# Patient Record
Sex: Female | Born: 1960
Health system: Southern US, Community
[De-identification: ages and names within clinical notes are randomized; demographics above are authoritative.]

## PROBLEM LIST (undated history)

## (undated) DIAGNOSIS — M545 Low back pain: Secondary | ICD-10-CM

## (undated) DIAGNOSIS — Z86010 Personal history of colonic polyps: Secondary | ICD-10-CM

## (undated) DIAGNOSIS — T7840XA Allergy, unspecified, initial encounter: Secondary | ICD-10-CM

## (undated) DIAGNOSIS — E1165 Type 2 diabetes mellitus with hyperglycemia: Secondary | ICD-10-CM

## (undated) DIAGNOSIS — F411 Generalized anxiety disorder: Secondary | ICD-10-CM

## (undated) DIAGNOSIS — R002 Palpitations: Secondary | ICD-10-CM

## (undated) DIAGNOSIS — Z0000 Encounter for general adult medical examination without abnormal findings: Secondary | ICD-10-CM

## (undated) DIAGNOSIS — E669 Obesity, unspecified: Secondary | ICD-10-CM

## (undated) DIAGNOSIS — Z720 Tobacco use: Secondary | ICD-10-CM

## (undated) DIAGNOSIS — F17200 Nicotine dependence, unspecified, uncomplicated: Secondary | ICD-10-CM

## (undated) DIAGNOSIS — E663 Overweight: Secondary | ICD-10-CM

## (undated) DIAGNOSIS — R319 Hematuria, unspecified: Secondary | ICD-10-CM

## (undated) DIAGNOSIS — R7309 Other abnormal glucose: Secondary | ICD-10-CM

## (undated) DIAGNOSIS — F329 Major depressive disorder, single episode, unspecified: Secondary | ICD-10-CM

## (undated) DIAGNOSIS — E785 Hyperlipidemia, unspecified: Secondary | ICD-10-CM

## (undated) HISTORY — DX: Hyperlipidemia, unspecified: E78.5

## (undated) HISTORY — DX: Major depressive disorder, single episode, unspecified: F32.9

## (undated) HISTORY — DX: Nicotine dependence, unspecified, uncomplicated: F17.200

## (undated) HISTORY — DX: Low back pain: M54.5

## (undated) HISTORY — DX: Obesity, unspecified: E66.9

## (undated) HISTORY — DX: Encounter for general adult medical examination without abnormal findings: Z00.00

## (undated) HISTORY — DX: Overweight: E66.3

## (undated) HISTORY — DX: Other abnormal glucose: R73.09

## (undated) HISTORY — DX: Hematuria, unspecified: R31.9

## (undated) HISTORY — DX: Generalized anxiety disorder: F41.1

## (undated) HISTORY — DX: Palpitations: R00.2

## (undated) HISTORY — DX: Personal history of colonic polyps: Z86.010

## (undated) HISTORY — DX: Type 2 diabetes mellitus with hyperglycemia: E11.65

## (undated) HISTORY — DX: Allergy, unspecified, initial encounter: T78.40XA

## (undated) HISTORY — DX: Tobacco use: Z72.0

---

## 2001-01-11 ENCOUNTER — Other Ambulatory Visit: Admission: RE | Admit: 2001-01-11 | Discharge: 2001-01-11 | Payer: Self-pay | Admitting: Obstetrics and Gynecology

## 2001-01-12 ENCOUNTER — Other Ambulatory Visit: Admission: RE | Admit: 2001-01-12 | Discharge: 2001-01-12 | Payer: Self-pay | Admitting: Obstetrics and Gynecology

## 2002-07-04 ENCOUNTER — Other Ambulatory Visit: Admission: RE | Admit: 2002-07-04 | Discharge: 2002-07-04 | Payer: Self-pay | Admitting: Obstetrics and Gynecology

## 2002-07-30 ENCOUNTER — Ambulatory Visit (HOSPITAL_COMMUNITY): Admission: RE | Admit: 2002-07-30 | Discharge: 2002-07-30 | Payer: Self-pay | Admitting: Obstetrics and Gynecology

## 2004-08-23 HISTORY — PX: COLONOSCOPY: SHX174

## 2004-09-03 ENCOUNTER — Ambulatory Visit: Payer: Self-pay | Admitting: Family Medicine

## 2005-04-08 ENCOUNTER — Ambulatory Visit (HOSPITAL_COMMUNITY): Admission: RE | Admit: 2005-04-08 | Discharge: 2005-04-08 | Payer: Self-pay | Admitting: General Surgery

## 2005-04-08 ENCOUNTER — Encounter (INDEPENDENT_AMBULATORY_CARE_PROVIDER_SITE_OTHER): Payer: Self-pay | Admitting: Specialist

## 2005-04-08 ENCOUNTER — Ambulatory Visit (HOSPITAL_BASED_OUTPATIENT_CLINIC_OR_DEPARTMENT_OTHER): Admission: RE | Admit: 2005-04-08 | Discharge: 2005-04-08 | Payer: Self-pay | Admitting: General Surgery

## 2005-06-29 ENCOUNTER — Ambulatory Visit: Payer: Self-pay | Admitting: Family Medicine

## 2005-12-08 ENCOUNTER — Ambulatory Visit: Payer: Self-pay | Admitting: Family Medicine

## 2007-05-02 DIAGNOSIS — Z860100 Personal history of colon polyps, unspecified: Secondary | ICD-10-CM

## 2007-05-02 HISTORY — DX: Personal history of colonic polyps: Z86.010

## 2007-05-02 HISTORY — DX: Personal history of colon polyps, unspecified: Z86.0100

## 2007-05-10 ENCOUNTER — Telehealth: Payer: Self-pay | Admitting: Family Medicine

## 2007-07-12 ENCOUNTER — Ambulatory Visit: Payer: Self-pay | Admitting: Family Medicine

## 2007-07-12 DIAGNOSIS — R7309 Other abnormal glucose: Secondary | ICD-10-CM | POA: Insufficient documentation

## 2007-07-12 DIAGNOSIS — F418 Other specified anxiety disorders: Secondary | ICD-10-CM | POA: Insufficient documentation

## 2007-07-12 DIAGNOSIS — F3289 Other specified depressive episodes: Secondary | ICD-10-CM

## 2007-07-12 DIAGNOSIS — F329 Major depressive disorder, single episode, unspecified: Secondary | ICD-10-CM

## 2007-07-12 DIAGNOSIS — F411 Generalized anxiety disorder: Secondary | ICD-10-CM

## 2007-07-12 HISTORY — DX: Other abnormal glucose: R73.09

## 2007-07-12 HISTORY — DX: Generalized anxiety disorder: F41.1

## 2007-07-12 HISTORY — DX: Other specified depressive episodes: F32.89

## 2007-07-12 HISTORY — DX: Major depressive disorder, single episode, unspecified: F32.9

## 2007-07-13 LAB — CONVERTED CEMR LAB
ALT: 17 units/L (ref 0–35)
Alkaline Phosphatase: 54 units/L (ref 39–117)
BUN: 12 mg/dL (ref 6–23)
Basophils Relative: 0.5 % (ref 0.0–1.0)
CO2: 27 meq/L (ref 19–32)
Calcium: 9.8 mg/dL (ref 8.4–10.5)
Cholesterol: 210 mg/dL (ref 0–200)
Eosinophils Absolute: 0 10*3/uL (ref 0.0–0.6)
GFR calc Af Amer: 99 mL/min
GFR calc non Af Amer: 82 mL/min
Glucose, Urine, Semiquant: NEGATIVE
HDL: 51.3 mg/dL (ref >39.0)
Ketones, urine, test strip: NEGATIVE
Lymphocytes Relative: 41.6 % (ref 12.0–46.0)
Monocytes Relative: 5.7 % (ref 3.0–11.0)
Neutro Abs: 4.1 10*3/uL (ref 1.4–7.7)
Platelets: 321 10*3/uL (ref 150–400)
RBC: 4.46 M/uL (ref 3.87–5.11)
TSH: 1.59 microintl units/mL (ref 0.35–5.50)
Triglycerides: 117 mg/dL (ref 0–149)
VLDL: 23 mg/dL (ref 0–40)

## 2007-07-27 ENCOUNTER — Ambulatory Visit: Payer: Self-pay | Admitting: Family Medicine

## 2007-10-31 ENCOUNTER — Ambulatory Visit: Payer: Self-pay | Admitting: Family Medicine

## 2007-11-07 ENCOUNTER — Ambulatory Visit: Payer: Self-pay | Admitting: Family Medicine

## 2007-11-07 DIAGNOSIS — E669 Obesity, unspecified: Secondary | ICD-10-CM | POA: Insufficient documentation

## 2007-11-07 HISTORY — DX: Obesity, unspecified: E66.9

## 2007-11-07 LAB — CONVERTED CEMR LAB: Glucose, Bld: 137 mg/dL — ABNORMAL HIGH (ref 70–99)

## 2008-02-07 ENCOUNTER — Ambulatory Visit: Payer: Self-pay | Admitting: Family Medicine

## 2008-02-07 DIAGNOSIS — E785 Hyperlipidemia, unspecified: Secondary | ICD-10-CM

## 2008-02-07 DIAGNOSIS — E782 Mixed hyperlipidemia: Secondary | ICD-10-CM | POA: Insufficient documentation

## 2008-02-07 HISTORY — DX: Hyperlipidemia, unspecified: E78.5

## 2008-02-14 ENCOUNTER — Ambulatory Visit: Payer: Self-pay | Admitting: Family Medicine

## 2008-02-14 LAB — CONVERTED CEMR LAB
Cholesterol: 202 mg/dL (ref 0–200)
Total CHOL/HDL Ratio: 4.3

## 2008-05-06 ENCOUNTER — Ambulatory Visit: Payer: Self-pay | Admitting: Family Medicine

## 2008-05-08 LAB — CONVERTED CEMR LAB: Hgb A1c MFr Bld: 7.6 % — ABNORMAL HIGH (ref 4.6–6.0)

## 2008-05-14 ENCOUNTER — Ambulatory Visit: Payer: Self-pay | Admitting: Family Medicine

## 2008-08-07 ENCOUNTER — Ambulatory Visit: Payer: Self-pay | Admitting: Family Medicine

## 2008-08-07 LAB — CONVERTED CEMR LAB
Glucose, Urine, Semiquant: NEGATIVE
Specific Gravity, Urine: >=1.03
WBC Urine, dipstick: NEGATIVE
pH: 5

## 2008-09-03 ENCOUNTER — Ambulatory Visit: Payer: Self-pay | Admitting: Family Medicine

## 2008-09-03 LAB — CONVERTED CEMR LAB
Albumin: 3.7 g/dL (ref 3.5–5.2)
BUN: 12 mg/dL (ref 6–23)
Basophils Relative: 0.3 % (ref 0.0–3.0)
Calcium: 9 mg/dL (ref 8.4–10.5)
Creatinine, Ser: 0.8 mg/dL (ref 0.4–1.2)
Creatinine,U: 289.7 mg/dL
Eosinophils Absolute: 0.1 10*3/uL (ref 0.0–0.7)
Eosinophils Relative: 0.8 % (ref 0.0–5.0)
GFR calc Af Amer: 99 mL/min
GFR calc non Af Amer: 82 mL/min
HCT: 38.1 % (ref 36.0–46.0)
HDL: 51.7 mg/dL (ref >39.0)
Hemoglobin: 13 g/dL (ref 12.0–15.0)
Hgb A1c MFr Bld: 7.1 % — ABNORMAL HIGH (ref 4.6–6.0)
MCV: 87.7 fL (ref 78.0–100.0)
Microalb Creat Ratio: 4.1 mg/g (ref 0.0–30.0)
Microalb, Ur: 1.2 mg/dL (ref 0.0–1.9)
Monocytes Absolute: 0.5 10*3/uL (ref 0.1–1.0)
Neutro Abs: 4.6 10*3/uL (ref 1.4–7.7)
Platelets: 293 10*3/uL (ref 150–400)
Potassium: 4 meq/L (ref 3.5–5.1)
Total Protein: 7.1 g/dL (ref 6.0–8.3)
WBC: 7.7 10*3/uL (ref 4.5–10.5)

## 2008-12-03 ENCOUNTER — Ambulatory Visit: Payer: Self-pay | Admitting: Family Medicine

## 2008-12-10 ENCOUNTER — Ambulatory Visit: Payer: Self-pay | Admitting: Family Medicine

## 2009-04-14 ENCOUNTER — Ambulatory Visit: Payer: Self-pay | Admitting: Family Medicine

## 2009-04-22 ENCOUNTER — Ambulatory Visit: Payer: Self-pay | Admitting: Family Medicine

## 2009-09-30 ENCOUNTER — Ambulatory Visit: Payer: Self-pay | Admitting: Family Medicine

## 2009-10-03 LAB — CONVERTED CEMR LAB: Hgb A1c MFr Bld: 7 % — ABNORMAL HIGH (ref 4.6–6.5)

## 2009-10-07 ENCOUNTER — Ambulatory Visit: Payer: Self-pay | Admitting: Family Medicine

## 2010-01-27 ENCOUNTER — Ambulatory Visit: Payer: Self-pay | Admitting: Family Medicine

## 2010-02-17 ENCOUNTER — Ambulatory Visit: Payer: Self-pay | Admitting: Family Medicine

## 2010-02-25 ENCOUNTER — Ambulatory Visit: Payer: Self-pay | Admitting: Family Medicine

## 2010-02-25 DIAGNOSIS — E669 Obesity, unspecified: Secondary | ICD-10-CM

## 2010-02-25 DIAGNOSIS — IMO0001 Reserved for inherently not codable concepts without codable children: Secondary | ICD-10-CM

## 2010-02-25 DIAGNOSIS — E1169 Type 2 diabetes mellitus with other specified complication: Secondary | ICD-10-CM | POA: Insufficient documentation

## 2010-02-25 HISTORY — DX: Reserved for inherently not codable concepts without codable children: IMO0001

## 2010-02-25 LAB — CONVERTED CEMR LAB
Albumin: 3.7 g/dL (ref 3.5–5.2)
Alkaline Phosphatase: 47 units/L (ref 39–117)
Basophils Absolute: 0 10*3/uL (ref 0.0–0.1)
Basophils Relative: 0.5 % (ref 0.0–3.0)
Bilirubin, Direct: 0.1 mg/dL (ref 0.0–0.3)
CO2: 27 meq/L (ref 19–32)
Calcium: 8.9 mg/dL (ref 8.4–10.5)
Chloride: 109 meq/L (ref 96–112)
Cholesterol: 213 mg/dL — ABNORMAL HIGH (ref 0–200)
Creatinine, Ser: 0.8 mg/dL (ref 0.4–1.2)
Creatinine,U: 185.8 mg/dL
Eosinophils Absolute: 0.1 10*3/uL (ref 0.0–0.7)
Glucose, Bld: 134 mg/dL — ABNORMAL HIGH (ref 70–99)
Hgb A1c MFr Bld: 7.3 % — ABNORMAL HIGH (ref 4.6–6.5)
Lymphocytes Relative: 34.7 % (ref 12.0–46.0)
MCHC: 33.8 g/dL (ref 30.0–36.0)
MCV: 84.9 fL (ref 78.0–100.0)
Microalb, Ur: 1.4 mg/dL (ref 0.0–1.9)
Monocytes Absolute: 0.6 10*3/uL (ref 0.1–1.0)
Neutro Abs: 3.8 10*3/uL (ref 1.4–7.7)
Neutrophils Relative %: 55.5 % (ref 43.0–77.0)
RDW: 16.3 % — ABNORMAL HIGH (ref 11.5–14.6)
Total Protein: 7.1 g/dL (ref 6.0–8.3)
Triglycerides: 154 mg/dL — ABNORMAL HIGH (ref 0.0–149.0)

## 2010-03-02 LAB — CONVERTED CEMR LAB
Bilirubin Urine: NEGATIVE
Glucose, Urine, Semiquant: NEGATIVE
Ketones, urine, test strip: NEGATIVE
Specific Gravity, Urine: >=1.03
pH: 5

## 2010-06-24 ENCOUNTER — Ambulatory Visit: Payer: Self-pay | Admitting: Family Medicine

## 2010-06-24 DIAGNOSIS — E663 Overweight: Secondary | ICD-10-CM

## 2010-06-24 DIAGNOSIS — F17200 Nicotine dependence, unspecified, uncomplicated: Secondary | ICD-10-CM | POA: Insufficient documentation

## 2010-06-24 HISTORY — DX: Nicotine dependence, unspecified, uncomplicated: F17.200

## 2010-06-24 HISTORY — DX: Overweight: E66.3

## 2010-09-12 ENCOUNTER — Encounter: Payer: Self-pay | Admitting: Family Medicine

## 2010-09-15 ENCOUNTER — Encounter: Payer: Self-pay | Admitting: Family Medicine

## 2010-09-16 LAB — CONVERTED CEMR LAB
ALT: 13 units/L (ref 0–35)
AST: 18 units/L (ref 0–37)
Alkaline Phosphatase: 45 units/L (ref 39–117)
Basophils Absolute: 0 10*3/uL (ref 0.0–0.1)
Basophils Relative: 0.2 % (ref 0.0–3.0)
CO2: 28 meq/L (ref 19–32)
Chloride: 107 meq/L (ref 96–112)
Cholesterol: 211 mg/dL — ABNORMAL HIGH (ref 0–200)
Eosinophils Relative: 0.2 % (ref 0.0–5.0)
GFR calc non Af Amer: 90.02 mL/min (ref >60)
HCT: 35.9 % — ABNORMAL LOW (ref 36.0–46.0)
Hemoglobin: 12.1 g/dL (ref 12.0–15.0)
Hgb A1c MFr Bld: 6.9 % — ABNORMAL HIGH (ref 4.6–6.5)
Lymphocytes Relative: 32.4 % (ref 12.0–46.0)
Lymphs Abs: 2.5 10*3/uL (ref 0.7–4.0)
Monocytes Relative: 6.1 % (ref 3.0–12.0)
Neutro Abs: 4.6 10*3/uL (ref 1.4–7.7)
Phosphorus: 3.6 mg/dL (ref 2.3–4.6)
Potassium: 4.3 meq/L (ref 3.5–5.1)
RBC: 4.23 M/uL (ref 3.87–5.11)
RDW: 15.8 % — ABNORMAL HIGH (ref 11.5–14.6)
TSH: 1.22 microintl units/mL (ref 0.35–5.50)
Total CHOL/HDL Ratio: 4
VLDL: 37.2 mg/dL (ref 0.0–40.0)
WBC: 7.6 10*3/uL (ref 4.5–10.5)

## 2010-09-22 NOTE — Assessment & Plan Note (Addendum)
Summary: fu on dm/njr   Vital Signs:  Patient profile:   50 year old female Height:      62 inches (157.48 cm) Weight:      171 pounds (77.73 kg) O2 Sat:      98 % on Room air Temp:     98.4 degrees F (36.89 degrees C) oral Pulse rate:   89 / minute BP sitting:   122 / 82  (left arm) Cuff size:   regular  Vitals Entered By: Josph Macho RMA (June 24, 2010 9:45 AM)  O2 Flow:  Room air CC: Follow up on Diabetes/ needs labs for A1C/ CF   History of Present Illness: smoking female in today for reevaluation of hyperglycemia. She is a diagnosed diabetic but says she does not consider herself diabetic, does not check her sugars at home. Denies polyuria polydipsia. Has been exercising eating less and avoiding simple carbs since her last visit and is pleased with her weight loss. She denies any recent illness, fevers, chills, chest pain, palpitations, GI or GU complaints. She does continue to smoke a pack and a half per day and declines smoking cessation efforts. She is under a great deal less dressed in a bad marriage and doesn't leave the Zoloft and alprazolam helped with the degree that she is unwilling to quit smoking at this time  Preventive Screening-Counseling & Management  Alcohol-Tobacco     Smoking Cessation Counseling: YES  Current Medications (verified): 1)  Zoloft 100 Mg  Tabs (Sertraline Hcl) .... Once Daily 2)  Alprazolam 0.5 Mg  Tabs (Alprazolam) .... Three Times A Day As Needed Stress No Early Refills. Must Fill On Sch Only  Allergies (verified): 1)  ! Sulfa 2)  ! * Levauquin 3)  ! Sulfa  Past History:  Past medical history reviewed for relevance to current acute and chronic problems. Social history (including risk factors) reviewed for relevance to current acute and chronic problems.  Past Medical History: Reviewed history from 01/27/2010 and no changes required. Colonic polyps, hx of UTI PAP  sees [facility]  DEXA-BONE DENSITY DT  refused  today     SMOKER   ppd Anxiety  Social History: Reviewed history from 01/27/2010 and no changes required. Occupation: Single Current Smoker - 1 pack daily Alcohol use-no Drug use-no Regular exercise-no  Review of Systems      See HPI  Physical Exam  General:  Well-developed,well-nourished,in no acute distress; alert,appropriate and cooperative throughout examination Head:  Normocephalic and atraumatic without obvious abnormalities. No apparent alopecia or balding. Mouth:  Oral mucosa and oropharynx without lesions or exudates.   Lungs:  Normal respiratory effort, chest expands symmetrically. Lungs are clear to auscultation, no crackles or wheezes. Heart:  Normal rate and regular rhythm. S1 and S2 normal without gallop, murmur, click, rub or other extra sounds. Abdomen:  Bowel sounds positive,abdomen soft and non-tender without masses, organomegaly or hernias noted. Extremities:  No clubbing, cyanosis, edema, or deformity noted  Psych:  Cognition and judgment appear intact. Alert and cooperative with normal attention span and concentration. No apparent delusions, illusions, hallucinations   Impression & Recommendations:  Problem # 1:  DIABETES MELLITUS, TYPE II, UNCONTROLLED (ICD-250.02)  Orders: TLB-Renal Function Panel (80069-RENAL) TLB-A1C / Hgb A1C (Glycohemoglobin) (83036-A1C) Specimen Handling (16109) Venipuncture (60454) Avoid all simple carbs and set up repeat appt in 3 months  Problem # 2:  TOBACCO USER (ICD-305.1)  Orders: Tobacco use cessation intermediate 3-10 minutes (09811) Encouraged cessation at length, patient declined  Problem # 3:  OVERWEIGHT (ICD-278.02)  Orders: TLB-CBC Platelet - w/Differential (85025-CBCD) TLB-Hepatic/Liver Function Pnl (80076-HEPATIC) TLB-TSH (Thyroid Stimulating Hormone) (84443-TSH) Cont exercise regimen and dietary changes, avoid all trans fats  Problem # 4:  HYPERLIPIDEMIA (ICD-272.4)  Orders: TLB-Lipid Panel  (80061-LIPID) Patient declines medications and even fish oil supplmentation  Complete Medication List: 1)  Zoloft 100 Mg Tabs (Sertraline hcl) .... Once daily 2)  Alprazolam 0.5 Mg Tabs (Alprazolam) .... Three times a day as needed stress no early refills. must fill on sch only  Patient Instructions: 1)  Please schedule a follow-up appointment in 3 months .  2)  BMP prior to visit, ICD-9: 250.02 3)  Hepatic Panel prior to visit ICD-9: 278.02 4)  Lipid panel prior to visit ICD-9 : 272.4 5)  TSH prior to visit ICD-9 : 278.02 6)  CBC w/ Diff prior to visit ICD-9 : 278.02 7)  HgBA1c prior to visit  ICD-9: 250.02 8)  Urine Microalbumin prior to visit ICD-9 : 250.02 9)  Tobacco is very bad for your health and your loved ones ! You should stop smoking !  10)  Stop smoking tips: Choose a quit date. Cut down before the quit date. Decide what you will do as a substitute when you feel the urge to smoke(gum, toothpick, exercise).  11)  You need to lose weight. Consider a lower calorie diet and regular exercise.    Orders Added: 1)  Tobacco use cessation intermediate 3-10 minutes [99406] 2)  TLB-Lipid Panel [80061-LIPID] 3)  TLB-Renal Function Panel [80069-RENAL] 4)  TLB-CBC Platelet - w/Differential [85025-CBCD] 5)  TLB-Hepatic/Liver Function Pnl [80076-HEPATIC] 6)  TLB-TSH (Thyroid Stimulating Hormone) [84443-TSH] 7)  TLB-A1C / Hgb A1C (Glycohemoglobin) [83036-A1C] 8)  Specimen Handling [99000] 9)  Venipuncture [36415] 10)  Est. Patient Level IV [08657]

## 2010-09-22 NOTE — Assessment & Plan Note (Signed)
Summary: fu on meds/njr   Vital Signs:  Patient profile:   50 year old female Weight:      176.4 pounds BMI:     32.38 O2 Sat:      98 % Temp:     98 degrees F Pulse rate:   86 / minute Pulse rhythm:   regular BP sitting:   130 / 84  (left arm)  Vitals Entered By: Pura Spice, RN (October 07, 2009 11:12 AM) CC: results labs and refill alprazolam    History of Present Illness: This 50 year old white married female known diabetic2 is in for her 3 month followup of diabetes with a hemoglobin A1c also in for refill of medications for anxiety depression Diabetes controlled by diet and weight loss which he has lost approximately 5 pounds Patient relates that she has been doing for well, feels great and continues to try lose weight pleased with the hemoglobin A1c of 7.2, was 7.0 last visit No new complaint and will schedule complete on the next exam  Allergies: 1)  ! Sulfa 2)  ! * Levauquin  Past History:  Past Medical History: Last updated: 09/03/2008 Colonic polyps, hx of UTI     PAP  sees [facility]     DEXA-BONE DENSITY  DT  refused today     SMOKER   ppd  Past Surgical History: Last updated: 05/02/2007 Laparoscopy  Social History: Last updated: 05/02/2007 Occupation: Single Current Smoker Alcohol use-no Drug use-no Regular exercise-no  Risk Factors: Smoking Status: current (05/02/2007) Packs/Day: 1.5 (02/14/2008)  Review of Systems  The patient denies anorexia, fever, weight loss, weight gain, vision loss, decreased hearing, hoarseness, chest pain, syncope, dyspnea on exertion, peripheral edema, prolonged cough, headaches, hemoptysis, abdominal pain, melena, hematochezia, severe indigestion/heartburn, hematuria, incontinence, genital sores, muscle weakness, suspicious skin lesions, transient blindness, difficulty walking, depression, unusual weight change, abnormal bleeding, enlarged lymph nodes, angioedema, breast masses, and testicular  masses.    Physical Exam  General:  Well-developed,well-nourished,in no acute distress; alert,appropriate and cooperative throughout examinationoverweight-appearing.   Neck:  No deformities, masses, or tenderness noted. Chest Wall:  No deformities, masses, or tenderness noted. Breasts:  not examined Lungs:  Normal respiratory effort, chest expands symmetrically. Lungs are clear to auscultation, no crackles or wheezes. Heart:  Normal rate and regular rhythm. S1 and S2 normal without gallop, murmur, click, rub or other extra sounds. Abdomen:  Bowel sounds positive,abdomen soft and non-tender without masses, organomegaly or hernias noted. Rectal:  not examined Genitalia:  not examined Msk:  No deformity or scoliosis noted of thoracic or lumbar spine.   Skin:  Intact without suspicious lesions or rashes Psych:  Cognition and judgment appear intact. Alert and cooperative with normal attention span and concentration. No apparent delusions, illusions, hallucinations   Impression & Recommendations:  Problem # 1:  DIABETES MELLITUS, TYPE II, MILD (ICD-250.00) Assessment Improved  Her updated medication list for this problem includes:    Aspir-low 81 Mg Tbec (Aspirin) .Marland Kitchen... 1 qd  Problem # 2:  HYPERLIPIDEMIA (ICD-272.4) Assessment: Improved  Problem # 3:  HYPERLIPIDEMIA (ICD-272.4) Assessment: Improved  Problem # 4:  EXOGENOUS OBESITY (ICD-278.00) Assessment: Improved  Problem # 5:  DEPRESSION (ICD-311) Assessment: Improved  Her updated medication list for this problem includes:    Zoloft 100 Mg Tabs (Sertraline hcl) ..... Once daily    Alprazolam 0.5 Mg Tabs (Alprazolam) .Marland Kitchen... Three times a day as needed stress no early refills. must fill on sch only  Problem # 6:  ANXIETY  STATE, UNSPECIFIED (ICD-300.00) Assessment: Improved  Her updated medication list for this problem includes:    Zoloft 100 Mg Tabs (Sertraline hcl) ..... Once daily    Alprazolam 0.5 Mg Tabs (Alprazolam) .Marland Kitchen...  Three times a day as needed stress no early refills. must fill on sch only  Complete Medication List: 1)  Zoloft 100 Mg Tabs (Sertraline hcl) .... Once daily 2)  Alprazolam 0.5 Mg Tabs (Alprazolam) .... Three times a day as needed stress no early refills. must fill on sch only 3)  Aspir-low 81 Mg Tbec (Aspirin) .Marland Kitchen.. 1 qd  Patient Instructions: 1)  Pt doing find, HgbA1c  7.0 2)  Lost weight 3)  continue same diet and exercise, just plain walking 4)  will refill alprazolam 5)  scxhedule physical in 3 months Prescriptions: ALPRAZOLAM 0.5 MG  TABS (ALPRAZOLAM) three times a day as needed stress no early refills. must fill on sch only  #90 x 5   Entered and Authorized by:   Judithann Sheen MD   Signed by:   Judithann Sheen MD on 10/07/2009   Method used:   Print then Give to Patient   RxID:   [id number]

## 2010-09-22 NOTE — Assessment & Plan Note (Signed)
Summary: Ear pain -B, cough-clear x 1 wk rm 3   Vital Signs:  Patient Profile:   50 Years Old Female CC:      Cold & URI symptoms LMP:     01/23/2010 Height:     62 inches Weight:      176 pounds O2 Sat:      98 % O2 treatment:    Room Air Temp:     97.5 degrees F oral Pulse rate:   66 / minute Pulse rhythm:   regular Resp:     16 per minute BP sitting:   127 / 81  (right arm) Cuff size:   regular  Vitals Entered By: Areta Haber CMA (January 27, 2010 9:27 AM)  Menstrual History: LMP (date): 01/23/2010 LMP - Character: normal LMP - Reliable: Yes                  Current Allergies: ! SULFA ! [facility] ! SULFA      History of Present Illness Chief Complaint: Cold & URI symptoms History of Present Illness: Subjective: Patient complains of URI symptoms for 8 days.  Cough started 2 days ago.  Patient smokes 1.5 pack per day  No sore throat. No pleuritic pain + wheezing + nasal congestion No post-nasal drainage No sinus pain/pressure No itchy/red eyes No earache No hemoptysis No SOB No fever/chills No nausea No vomiting No abdominal pain No diarrhea No skin rashes + fatigue No myalgias No headache Used OTC meds without relief   Current Problems: URI (ICD-465.9) ANXIETY (ICD-300.00) ALLERGIC RHINITIS (ICD-477.9) DIABETES MELLITUS, TYPE II, MILD (ICD-250.00) HYPERLIPIDEMIA (ICD-272.4) EXOGENOUS OBESITY (ICD-278.00) DEPRESSION (ICD-311) ANXIETY STATE, UNSPECIFIED (ICD-300.00) HYPERGLYCEMIA (ICD-790.29) PHYSICAL EXAMINATION (ICD-V70.0) COLONIC POLYPS, HX OF (ICD-V12.72)   Current Meds ZOLOFT 100 MG  TABS (SERTRALINE HCL) once daily ALPRAZOLAM 0.5 MG  TABS (ALPRAZOLAM) three times a day as needed stress no early refills. must fill on sch only AMOXICILLIN 500 MG CAPS (AMOXICILLIN) 1 tab by mouth three times a day AMOXICILLIN 500 MG CAPS (AMOXICILLIN) One capsule by mouth three times a day BENZONATATE 200 MG CAPS (BENZONATATE) One by mouth hs  as needed cough  REVIEW OF SYSTEMS Constitutional Symptoms      Denies fever, chills, night sweats, weight loss, weight gain, and fatigue.  Eyes       Denies change in vision, eye pain, eye discharge, glasses, contact lenses, and eye surgery. Ear/Nose/Throat/Mouth       Complains of ear pain and sinus problems.      Denies hearing loss/aids, change in hearing, ear discharge, dizziness, frequent runny nose, frequent nose bleeds, sore throat, hoarseness, and tooth pain or bleeding.      Comments: B x 1 wk Respiratory       Complains of productive cough and wheezing.      Denies dry cough, shortness of breath, asthma, bronchitis, and emphysema/COPD.  Cardiovascular       Denies murmurs, chest pain, and tires easily with exhertion.    Gastrointestinal       Denies stomach pain, nausea/vomiting, diarrhea, constipation, blood in bowel movements, and indigestion. Genitourniary       Denies painful urination, kidney stones, and loss of urinary control. Neurological       Denies paralysis, seizures, and fainting/blackouts. Musculoskeletal       Denies muscle pain, joint pain, joint stiffness, decreased range of motion, redness, swelling, muscle weakness, and gout.  Skin       Denies bruising, unusual mles/lumps or  sores, and hair/skin or nail changes.  Psych       Denies mood changes, temper/anger issues, anxiety/stress, speech problems, depression, and sleep problems. Other Comments: Pt states that she has been taking Amoxcillin( old antibitic) that she got from her dentist. pt has not seen PCP for this.   Past History:  Past Surgical History: Last updated: 05/02/2007 Laparoscopy  Family History: Last updated: 05/02/2007 Family History Hypertension  Social History: Last updated: 01/27/2010 Occupation: Single Current Smoker - 1 pack daily Alcohol use-no Drug use-no Regular exercise-no  Risk Factors: Exercise: no (05/02/2007)  Risk Factors: Smoking Status: current  (05/02/2007) Packs/Day: 1.5 (02/14/2008)  Past Medical History: Colonic polyps, hx of UTI PAP  sees [facility]  DEXA-BONE DENSITY DT  refused today     SMOKER   ppd Anxiety  Social History: Occupation: Single Current Smoker - 1 pack daily Alcohol use-no Drug use-no Regular exercise-no   Objective:  Appearance:  Patient appears healthy, stated age, and in no acute distress  Ears:  Canals normal.  Tympanic membranes normal.   Nose:  Normal septum.  Normal turbinates, mildly congested.    No sinus tenderness present.  Pharynx:  Normal  Neck:  Supple.  No adenopathy is present.  No thyromegaly is present  Lungs:  Clear to auscultation.  Breath sounds are equal.  Heart:  Regular rate and rhythm without murmurs, rubs, or gallops.  Abdomen:  Nontender without masses or hepatosplenomegaly.  Bowel sounds are present.  No CVA or flank tenderness.  Extremities:  No edema.   Assessment New Problems: URI (ICD-465.9) ANXIETY (ICD-300.00)  SUSPECT VIRAL URI.  Smoker, has risk factors for sinusitis/bronchitis.  Plan New Medications/Changes: BENZONATATE 200 MG CAPS (BENZONATATE) One by mouth hs as needed cough  #12 x 0, 01/27/2010, Donna Christen MD AMOXICILLIN 500 MG CAPS (AMOXICILLIN) One capsule by mouth three times a day  #12 x 0, 01/27/2010, Donna Christen MD  New Orders: New Patient Level III [phone] Planning Comments:   Continue present amoxicillin for total 2 weeks.  Add expectorant/decongestant, cough suppressant at night. Follow-up with PCP if not improving.   The patient and/or caregiver has been counseled thoroughly with regard to medications prescribed including dosage, schedule, interactions, rationale for use, and possible side effects and they verbalize understanding.  Diagnoses and expected course of recovery discussed and will return if not improved as expected or if the condition worsens. Patient and/or caregiver verbalized understanding.   Prescriptions: BENZONATATE 200 MG CAPS (BENZONATATE) One by mouth hs as needed cough  #12 x 0   Entered and Authorized by:   Donna Christen MD   Signed by:   Donna Christen MD on 01/27/2010   Method used:   Print then Give to Patient   RxID:   [id number] AMOXICILLIN 500 MG CAPS (AMOXICILLIN) One capsule by mouth three times a day  #12 x 0   Entered and Authorized by:   Donna Christen MD   Signed by:   Donna Christen MD on 01/27/2010   Method used:   Print then Give to Patient   RxID:   [id number]   Patient Instructions: 1)  May use Mucinex D (guaifenesin with decongestant) twice daily for congestion. 2)  Increase fluid intake, rest. 3)  May use Afrin nasal spray (or generic oxymetazoline) twice daily for about 5 days.  Also recommend using saline nasal spray several times daily and/or saline nasal irrigation. 4)  Followup with family doctor if not improving one week.  Orders Added: 1)  New Patient Level III [phone]

## 2010-09-22 NOTE — Assessment & Plan Note (Signed)
Summary: cpx/no pap/njr PT [facility]SC/NJR---PT [facility] // RS---PT [facility] (2ND TIME) ...   Vital Signs:  Patient profile:   50 year old female Weight:      178 pounds O2 Sat:      98 % Temp:     98.4 degrees F Pulse rate:   110 / minute Pulse rhythm:   regular BP sitting:   110 / 80  Vitals Entered By: Pura Spice, RN (February 25, 2010 1:34 PM) CC: CPX No PAP. Does not want EKG on Menses    Contraindications/Deferment of Procedures/Staging:    Test/Procedure: Mammogram    Reason for deferment: patient declined     Test/Procedure: PAP Smear    Reason for deferment: patient declined     Test/Procedure: DPT vaccine    Reason for deferment: declined     Test/Procedure: Colonoscopy    Reason for deferment: patient declined   History of Present Illness: this 50 year old female presents for complete physical examination per request no Pap this year or pelvic She is a known diabetic being treate by diet and weight control, does not want to take medication Has not lost any weight D. to continue her Zoloft and alprazolam  Allergies: 1)  ! Sulfa 2)  ! * Levauquin 3)  ! Sulfa  Past History:  Past Medical History: Last updated: 01/27/2010 Colonic polyps, hx of UTI PAP  sees [facility]  DEXA-BONE DENSITY DT  refused today     SMOKER   ppd Anxiety  Past Surgical History: Last updated: 05/02/2007 Laparoscopy  Social History: Last updated: 01/27/2010 Occupation: Single Current Smoker - 1 pack daily Alcohol use-no Drug use-no Regular exercise-no  Risk Factors: Smoking Status: current (05/02/2007) Packs/Day: 1.5 (02/14/2008)  Family History: Reviewed history from 05/02/2007 and no changes required. Family History Hypertension Mother had carcinoma liver and expired  Review of Systems      See HPI  The patient denies anorexia, fever, weight loss, weight gain, vision loss, decreased hearing, hoarseness, chest pain, syncope, dyspnea on exertion, peripheral edema,  prolonged cough, headaches, hemoptysis, abdominal pain, melena, hematochezia, severe indigestion/heartburn, hematuria, incontinence, genital sores, muscle weakness, suspicious skin lesions, transient blindness, difficulty walking, depression, unusual weight change, abnormal bleeding, enlarged lymph nodes, angioedema, breast masses, and testicular masses.    Physical Exam  General:  Well-developed,well-nourished,in no acute distress; alert,appropriate and cooperative throughout examinationoverweight-appearing.   Head:  Normocephalic and atraumatic without obvious abnormalities. No apparent alopecia or balding. Eyes:  No corneal or conjunctival inflammation noted. EOMI. Perrla. Funduscopic exam benign, without hemorrhages, exudates or papilledema. Vision grossly normal. Ears:  External ear exam shows no significant lesions or deformities.  Otoscopic examination reveals clear canals, tympanic membranes are intact bilaterally without bulging, retraction, inflammation or discharge. Hearing is grossly normal bilaterally. Nose:  External nasal examination shows no deformity or inflammation. Nasal mucosa are pink and moist without lesions or exudates. Mouth:  Oral mucosa and oropharynx without lesions or exudates.  Teeth in good repair. Neck:  No deformities, masses, or tenderness noted. Chest Wall:  No deformities, masses, or tenderness noted. Breasts:  No mass, nodules, thickening, tenderness, bulging, retraction, inflamation, nipple discharge or skin changes noted.   Lungs:  Normal respiratory effort, chest expands symmetrically. Lungs are clear to auscultation, no crackles or wheezes. Heart:  Normal rate and regular rhythm. S1 and S2 normal without gallop, murmur, click, rub or other extra sounds. Abdomen:  Bowel sounds positive,abdomen soft and non-tender without masses, organomegaly or hernias noted. Rectal:  not examined  Genitalia:  not examined Msk:  No deformity or scoliosis noted of thoracic or  lumbar spine.   Pulses:  R and L carotid,radial,femoral,dorsalis pedis and posterior tibial pulses are full and equal bilaterally Extremities:  No clubbing, cyanosis, edema, or deformity noted with normal full range of motion of all joints.   Neurologic:  No cranial nerve deficits noted. Station and gait are normal. Plantar reflexes are down-going bilaterally. DTRs are symmetrical throughout. Sensory, motor and coordinative functions appear intact. Skin:  Intact without suspicious lesions or rashes Cervical Nodes:  No lymphadenopathy noted Axillary Nodes:  No palpable lymphadenopathy Inguinal Nodes:  No significant adenopathy Psych:  Cognition and judgment appear intact. Alert and cooperative with normal attention span and concentration. No apparent delusions, illusions, hallucinations   Impression & Recommendations:  Problem # 1:  DIABETES MELLITUS, TYPE II, UNCONTROLLED (ICD-250.02) Assessment Deteriorated HgbA1C 7.3, diet better, increase exercise, return 3 months for HgBAc  Problem # 2:  PHYSICAL EXAMINATION (ICD-V70.0) Assessment: Unchanged  Problem # 3:  EXOGENOUS OBESITY (ICD-278.00) Assessment: Unchanged  Problem # 4:  ANXIETY (ICD-300.00) Assessment: Improved  Her updated medication list for this problem includes:    Zoloft 100 Mg Tabs (Sertraline hcl) ..... Once daily    Alprazolam 0.5 Mg Tabs (Alprazolam) .Marland Kitchen... Three times a day as needed stress no early refills. must fill on sch only  Problem # 5:  DEPRESSION (ICD-311) Assessment: Improved  Her updated medication list for this problem includes:    Zoloft 100 Mg Tabs (Sertraline hcl) ..... Once daily    Alprazolam 0.5 Mg Tabs (Alprazolam) .Marland Kitchen... Three times a day as needed stress no early refills. must fill on sch only  Complete Medication List: 1)  Zoloft 100 Mg Tabs (Sertraline hcl) .... Once daily 2)  Alprazolam 0.5 Mg Tabs (Alprazolam) .... Three times a day as needed stress no early refills. must fill on sch  only  Patient Instructions: 1)  Diabetes not under control. 2)  Please diet better and increase exercise 3)  recheck HgbA1C in 3 months 4)  refilled medicines 5)  You need to lose weight. Consider a lower calorie diet and regular exercise.  Prescriptions: ALPRAZOLAM 0.5 MG  TABS (ALPRAZOLAM) three times a day as needed stress no early refills. must fill on sch only  #90 x 5   Entered and Authorized by:   Judithann Sheen MD   Signed by:   Judithann Sheen MD on 02/25/2010   Method used:   Print then Give to Patient   RxID:   [id number] ZOLOFT 100 MG  TABS (SERTRALINE HCL) once daily  #30 x 11   Entered and Authorized by:   Judithann Sheen MD   Signed by:   Judithann Sheen MD on 02/25/2010   Method used:   Electronically to        [facility]* (retail)       [address] [place], [place]  [address]       Ph: [phone]       Fax: [phone]   RxID:   [id number]    Orders Added: 1)  Est. Patient 40-64 years [phone]

## 2010-09-29 ENCOUNTER — Ambulatory Visit: Payer: BC Managed Care – PPO | Admitting: Family Medicine

## 2010-09-30 ENCOUNTER — Other Ambulatory Visit: Payer: BC Managed Care – PPO | Admitting: Family Medicine

## 2010-09-30 DIAGNOSIS — IMO0001 Reserved for inherently not codable concepts without codable children: Secondary | ICD-10-CM

## 2010-09-30 DIAGNOSIS — E785 Hyperlipidemia, unspecified: Secondary | ICD-10-CM

## 2010-09-30 DIAGNOSIS — E663 Overweight: Secondary | ICD-10-CM

## 2010-09-30 DIAGNOSIS — E1165 Type 2 diabetes mellitus with hyperglycemia: Secondary | ICD-10-CM

## 2010-09-30 LAB — BASIC METABOLIC PANEL
BUN: 11 mg/dL (ref 6–23)
Creatinine, Ser: 0.7 mg/dL (ref 0.4–1.2)
GFR: 89.92 mL/min (ref >60.00)
Glucose, Bld: 107 mg/dL — ABNORMAL HIGH (ref 70–99)
Potassium: 4.7 mEq/L (ref 3.5–5.1)

## 2010-09-30 LAB — CBC WITH DIFFERENTIAL/PLATELET
Basophils Relative: 0.4 % (ref 0.0–3.0)
Eosinophils Absolute: 0 10*3/uL (ref 0.0–0.7)
Hemoglobin: 12.1 g/dL (ref 12.0–15.0)
Lymphs Abs: 2.8 10*3/uL (ref 0.7–4.0)
MCHC: 33.6 g/dL (ref 30.0–36.0)
MCV: 85.3 fl (ref 78.0–100.0)
Monocytes Absolute: 0.4 10*3/uL (ref 0.1–1.0)
Neutro Abs: 3.3 10*3/uL (ref 1.4–7.7)
RBC: 4.23 Mil/uL (ref 3.87–5.11)

## 2010-09-30 LAB — HEMOGLOBIN A1C: Hgb A1c MFr Bld: 6.9 % — ABNORMAL HIGH (ref 4.6–6.5)

## 2010-09-30 LAB — LDL CHOLESTEROL, DIRECT: Direct LDL: 131.3 mg/dL

## 2010-10-15 ENCOUNTER — Encounter: Payer: Self-pay | Admitting: Family Medicine

## 2010-10-15 ENCOUNTER — Ambulatory Visit (INDEPENDENT_AMBULATORY_CARE_PROVIDER_SITE_OTHER): Payer: BC Managed Care – PPO | Admitting: Family Medicine

## 2010-10-15 VITALS — BP 110/70 | HR 88 | Temp 98.4°F | Wt 172.0 lb

## 2010-10-15 DIAGNOSIS — F419 Anxiety disorder, unspecified: Secondary | ICD-10-CM

## 2010-10-15 DIAGNOSIS — E119 Type 2 diabetes mellitus without complications: Secondary | ICD-10-CM

## 2010-10-15 DIAGNOSIS — F411 Generalized anxiety disorder: Secondary | ICD-10-CM

## 2010-10-15 MED ORDER — ALPRAZOLAM 0.5 MG PO TABS: 0.5000 mg | ORAL_TABLET | Freq: Three times a day (TID) | ORAL | Status: DC | PRN

## 2010-10-15 NOTE — Patient Instructions (Addendum)
Get HgbA1c in  3 months Refilled alprazolam Recommend stop smoking Get appt with gynecologist Pleased you  Have lost weight

## 2010-10-16 ENCOUNTER — Encounter: Payer: Self-pay | Admitting: Family Medicine

## 2010-10-16 NOTE — Progress Notes (Signed)
  Subjective:    Patient ID: Ashley Jackson, female    DOB: 05/17/1961, 50 y.o.   MRN: [id number] This 50 year old separated female is in to discuss her laboratory studies regarding her hemoglobin A1c which is improved. Vital signs are good patient has lost approximately 10 pounds. She relates some of this to stress in her life. She is separated from her husband December 25 also has a son 27 years old that has had seizures and is going to a neurologist at work first [employer]. Her dad is in the hospital with chest pain diagnosed and determined she is also very much stressed over the fact she cannot find a jobRelates she had a Pap smear at the gynecologist. She refuses to have a mammogram breasts were examined by the gynecologistShe requests refill of her medicationsI will continue to control her blood sugars by diet and exerciseHPI    Review of Systems  Constitutional: Negative.   HENT: Negative.   Eyes: Negative.   Respiratory: Negative.   Cardiovascular: Negative.   Gastrointestinal: Negative.   Genitourinary: Negative.   Musculoskeletal: Negative.   Skin: Negative.   Neurological: Negative.   Hematological: Negative.   Psychiatric/Behavioral:       Patient continues to have stress, anxiety and some depression       Objective:   Physical Exam  Constitutional: She is oriented to person, place, and time. She appears well-developed and well-nourished. She appears distressed.  HENT:  Head: Normocephalic and atraumatic.  Right Ear: External ear normal.  Left Ear: External ear normal.  Nose: Nose normal.  Mouth/Throat: Oropharynx is clear and moist.  Eyes: Conjunctivae and EOM are normal.  Neck: Normal range of motion. Neck supple. No tracheal deviation present. No thyromegaly present.  Cardiovascular: Normal rate, regular rhythm, normal heart sounds and intact distal pulses.  Exam reveals no gallop and no friction rub.   No murmur heard. Pulmonary/Chest: No respiratory distress.  She has no wheezes. She has no rales. She exhibits no tenderness.  Abdominal: Soft. She exhibits no distension. There is no tenderness. There is no rebound.  Musculoskeletal: Normal range of motion. She exhibits no edema and no tenderness.  Lymphadenopathy:    She has no cervical adenopathy.  Neurological: She is alert and oriented to person, place, and time. No cranial nerve deficit. Coordination normal.  Skin: Skin is warm and dry. She is not diaphoretic.          Assessment & Plan:  Patient overwhelmed to control her diabetes with diet and some exercise 2 continue her alprazolam and Zoloft for her emotional stresses Average film medications Return in 3 months for hemoglobin A1c

## 2011-01-08 NOTE — Op Note (Signed)
NAME:  Ashley Jackson, Ashley Jackson                 ACCOUNT NO.:  [id number]   MEDICAL RECORD NO.:  [id number]          PATIENT TYPE:  AMB   LOCATION:  [facility]                         FACILITY:  [facility]   PHYSICIAN:  Adolph Pollack, M.D.DATE OF BIRTH:  Aug 06, 1961   DATE OF PROCEDURE:  04/08/2005  DATE OF DISCHARGE:                                 OPERATIVE REPORT   PREOPERATIVE DIAGNOSES:  Chronic anal fissure, anal tag.   POSTOPERATIVE DIAGNOSES:  Chronic anal fissure with inflamed right  posterolateral hemorrhoid (internal and external) and anal polyp.   PROCEDURE:  1.  Exam under anesthesia with anoscopy.  2.  Excision of anal polyp.  3.  Hemorrhoidectomy.  4.  Repair of anal fissure by way of lateral internal sphincterotomy.   SURGEON:  Adolph Pollack, M.D.   ASSISTANT:  None.   ANESTHESIA:  General.   INDICATION FOR PROCEDURE:  Ms. Rod Can is a 50 year old female whose been  treated for anal fissure in the past and now is having pain in the anterior  anal region with some bleeding. She also has anal spasm. Cardizem cream had  worked in the past but not this time. On examination, she also had what  appeared to be a tag anteriorly in the right position. However, she was so  uncomfortable,  I could not examine her adequately. She now presents to the  operating room for exam under anesthesia, possible excision of anal tag and  repair of anal fissure.   FINDINGS:  There is a small posterior anal fissure present. There is a right  anterolateral inflamed hemorrhoid noted. A prolapsing anal polyp was also  noted at the 2 o'clock position.   TECHNIQUE:  She is seen in the holding area and brought to the operating  room,  placed supine on the operating table and general anesthetic was  administered. She is then carefully placed in the lithotomy position. The  perianal area was sterilely prepped and draped. Exam under anesthesia  demonstrate a small anal fissure. There is also a slightly  prolapsing  inflamed internal hemorrhoid at the right anterolateral position at  approximately 11 o'clock. There was a prolapsing anal polyp at the 2 o'clock  position. I performed a perianal block with 0.5% Marcaine with epinephrine.  I then used the cautery to excise the anal polyp and send to pathology. I  then performed an internal and external hemorrhoidectomy in the right  anterolateral position and reapproximated the mucosa with  running locking 2-  0 chromic suture. Following this, I then inserted the index finger into the  anal canal and then placed the internal sphincter muscle on tension. She did  have a hypertensive sphincter. A stab wound was made at the three o'clock  position and the lateral internal sphincterotomy performed. The sphincter  was much more lax after this. I closed that incision with a single 4-0  Monocryl suture.   At this point, I inspected everything and hemostasis was adequate. A piece  of Gelfoam was placed in the anal canal. A bulky dressing was applied.   She tolerated the procedure  well without any apparent complications and was  taken to recovery room in satisfactory condition.      Adolph Pollack, M.D.  Electronically Signed     TJR/MEDQ  D:  04/08/2005  T:  04/08/2005  Job:  [id number]   cc:   Lacretia Leigh. Quintella Reichert, M.D.

## 2011-01-08 NOTE — Op Note (Signed)
NAME:  Ashley Jackson, Ashley Jackson                            ACCOUNT NO.:  [id number]   MEDICAL RECORD NO.:  [id number]                   PATIENT TYPE:  AMB   LOCATION:  [facility]                                  FACILITY:  [facility]   PHYSICIAN:  Cynthia P. Romine, M.D.             DATE OF BIRTH:  [birth year]   DATE OF PROCEDURE:  07/30/2002  DATE OF DISCHARGE:                                 OPERATIVE REPORT   PREOPERATIVE DIAGNOSES:  1. Chronic pelvic pain.  2. Desire for attempt at permanent surgical sterilization.   POSTOPERATIVE DIAGNOSES:  1. Chronic pelvic pain.  2. Desire for attempt at permanent surgical sterilization.   PROCEDURE:  1. Diagnostic laparoscopy.  2. Falope ring bilateral tubal sterilization procedure.   SURGEON:  Cynthia P. Romine, M.D.   ANESTHESIA:  General endotracheal.   ESTIMATED BLOOD LOSS:  Minimal.   COMPLICATIONS:  None.   PROCEDURE:  The patient was taken to the operating room and after the  induction of adequate general endotracheal anesthesia was placed in the  dorsal lithotomy position and prepped and draped in the usual fashion.  An  acorn uterine manipulator was placed and the bladder was drained with a red  rubber catheter.  A subumbilical incision was made and the Veress needle was  inserted into the peritoneal space.  Proper placement was detected by noting  a negative aspirate, then free flow of saline through the Veress needle,  again with a negative aspirate, and then by noting a response of a drop in  saline placed at the hub of the Veress needle with negative pressure as the  abdominal wall was elevated.  Pneumoperitoneum was created with 3 L of CO2.  A disposable 10-11 mm trocar was then inserted into the peritoneal space and  its proper placement noted with the laparoscope.  There was some dissection  between the peritoneum and the fascia that I believe occurred as  insufflation was taking place.  The Veress needle had to be repositioned  because  initially the pressures on insufflation were high and therefore  there was some gas in the preperitoneal space and this tented the peritoneum  and this was noted at the time of laparoscopy.  A suprapubic incision was  made and an 8 mm trocar was then inserted under direct visualization and the  Falope ring applier placed.  The patient's right fallopian tube was  identified and traced to its fimbriated end.  The mid isthmic portion was  elevated and a Falope ring was placed.  A good knuckle of tube was noted to  be contained within the ring and good blanching was noted.  The procedure  was repeated on the patient's left, identifying the tube and tracing it to  its fimbriated end.  The mid isthmic portion was elevated and a Falope ring  was placed.  A good knuckle of tube was noted to be  contained within the  ring and good blanching was noted.  Photographic documentation was taken.  The uterus was noted to be of normal size, shape, and contour.  The ovaries  were both freely mobile as were the tubes.  A 5 mm incision was made and a 5  mm trocar inserted under direct visualization to the patient's left again  examined the area of peritoneum that had been tented during insufflation.  No bleeding was encountered in its preperitoneal space and the procedure was  felt to be best to be terminated.  The instruments were removed from the  abdomen.  Pneumoperitoneum was allowed to escape.  Incisions were closed  subcuticularly with 3-0 Vicryl Rapide.  They were injected with a total of 8  cc of 0.5% Marcaine with epinephrine for postoperative incisional pain  relief.  Instruments removed from the vagina.  The 5 mm trocar insertion  site was covered with Dermabond as was the 8 mm trocar insertion site.  These closures had been in the subcutaneous tissue only.  The procedure was  terminated.  The patient tolerated it well and went in satisfactory  condition to post anesthesia recovery.                                                Cynthia P. Romine, M.D.    CPR/MEDQ  D:  07/30/2002  T:  07/30/2002  Job:  [id number]

## 2011-01-13 ENCOUNTER — Other Ambulatory Visit (INDEPENDENT_AMBULATORY_CARE_PROVIDER_SITE_OTHER): Payer: BC Managed Care – PPO

## 2011-01-13 DIAGNOSIS — IMO0001 Reserved for inherently not codable concepts without codable children: Secondary | ICD-10-CM

## 2011-01-13 LAB — HEMOGLOBIN A1C: Hgb A1c MFr Bld: 7 % — ABNORMAL HIGH (ref 4.6–6.5)

## 2011-01-14 ENCOUNTER — Ambulatory Visit: Payer: BC Managed Care – PPO | Admitting: Family Medicine

## 2011-04-14 ENCOUNTER — Other Ambulatory Visit: Payer: Self-pay | Admitting: Family Medicine

## 2011-04-15 ENCOUNTER — Telehealth: Payer: Self-pay | Admitting: Family Medicine

## 2011-04-15 NOTE — Telephone Encounter (Signed)
Left message to notify pt to call office to schedule lab appointment for hgb a1c

## 2011-04-15 NOTE — Telephone Encounter (Signed)
Pt called req to get a lab order to have a1c rechecked prior to ov.

## 2011-04-20 ENCOUNTER — Other Ambulatory Visit: Payer: BC Managed Care – PPO

## 2011-04-29 ENCOUNTER — Other Ambulatory Visit (INDEPENDENT_AMBULATORY_CARE_PROVIDER_SITE_OTHER): Payer: BC Managed Care – PPO

## 2011-04-29 DIAGNOSIS — IMO0001 Reserved for inherently not codable concepts without codable children: Secondary | ICD-10-CM

## 2011-04-29 LAB — HEMOGLOBIN A1C: Hgb A1c MFr Bld: 6.7 % — ABNORMAL HIGH (ref 4.6–6.5)

## 2011-05-17 ENCOUNTER — Telehealth: Payer: Self-pay | Admitting: Family Medicine

## 2011-05-17 NOTE — Telephone Encounter (Signed)
Pt requesting results of lab work. Please contact.

## 2011-05-18 NOTE — Telephone Encounter (Signed)
Pt is aware that Dr. Scotty Court has been out and that she will be called about her labs.

## 2011-05-18 NOTE — Telephone Encounter (Signed)
Called to make aware that per Dr. Scotty Court pt's A1C is down from 7.0 to 6.7.  Per Dr. Scotty Court pt is to continue with same treatment.  Pt is aware.

## 2011-05-23 ENCOUNTER — Other Ambulatory Visit: Payer: Self-pay | Admitting: Family Medicine

## 2011-07-05 ENCOUNTER — Other Ambulatory Visit: Payer: Self-pay | Admitting: Family Medicine

## 2011-07-05 NOTE — Progress Notes (Signed)
Quick Note:  Pt aware ______ 

## 2011-07-06 NOTE — Telephone Encounter (Signed)
rx request for alprazolam 0.5 mg.  Pt last seen 10/15/10.  Pls advise.

## 2011-07-06 NOTE — Telephone Encounter (Signed)
90

## 2011-09-01 ENCOUNTER — Ambulatory Visit (INDEPENDENT_AMBULATORY_CARE_PROVIDER_SITE_OTHER): Payer: BC Managed Care – PPO | Admitting: Internal Medicine

## 2011-09-01 ENCOUNTER — Encounter: Payer: Self-pay | Admitting: Internal Medicine

## 2011-09-01 DIAGNOSIS — F411 Generalized anxiety disorder: Secondary | ICD-10-CM

## 2011-09-01 DIAGNOSIS — IMO0001 Reserved for inherently not codable concepts without codable children: Secondary | ICD-10-CM

## 2011-09-01 DIAGNOSIS — E119 Type 2 diabetes mellitus without complications: Secondary | ICD-10-CM

## 2011-09-01 DIAGNOSIS — F17200 Nicotine dependence, unspecified, uncomplicated: Secondary | ICD-10-CM

## 2011-09-01 DIAGNOSIS — E785 Hyperlipidemia, unspecified: Secondary | ICD-10-CM

## 2011-09-01 LAB — CBC
HCT: 40.4 % (ref 36.0–46.0)
Hemoglobin: 12.9 g/dL (ref 12.0–15.0)
MCHC: 31.9 g/dL (ref 30.0–36.0)
MCV: 88.4 fL (ref 78.0–100.0)

## 2011-09-01 LAB — BASIC METABOLIC PANEL
BUN: 14 mg/dL (ref 6–23)
Glucose, Bld: 124 mg/dL — ABNORMAL HIGH (ref 70–99)
Potassium: 4.9 mEq/L (ref 3.5–5.3)

## 2011-09-01 LAB — HEMOGLOBIN A1C: Hgb A1c MFr Bld: 6.9 % — ABNORMAL HIGH (ref <5.7)

## 2011-09-01 LAB — HEPATIC FUNCTION PANEL
ALT: 9 U/L (ref 0–35)
Alkaline Phosphatase: 50 U/L (ref 39–117)
Total Protein: 7.3 g/dL (ref 6.0–8.3)

## 2011-09-01 MED ORDER — SERTRALINE HCL 100 MG PO TABS: 100.0000 mg | ORAL_TABLET | Freq: Every day | ORAL | Status: DC

## 2011-09-01 MED ORDER — ALPRAZOLAM 0.5 MG PO TABS: 0.5000 mg | ORAL_TABLET | Freq: Three times a day (TID) | ORAL | Status: DC | PRN

## 2011-09-01 NOTE — Patient Instructions (Signed)
Please schedule chem7, a1c, lipid 250.0 prior to next appointment

## 2011-09-02 LAB — MICROALBUMIN / CREATININE URINE RATIO: Creatinine, Urine: 155.2 mg/dL

## 2011-09-03 NOTE — Assessment & Plan Note (Signed)
Obtain cbc, chem7, a1c, flp and urine microalbumin.

## 2011-09-03 NOTE — Assessment & Plan Note (Signed)
Declines cessation

## 2011-09-03 NOTE — Assessment & Plan Note (Signed)
Stable. Continue current regimen. Discussed and pt aware of potential for addiction and/or tolerance with xanax. Encourage sparing use.

## 2011-09-03 NOTE — Progress Notes (Signed)
  Subjective:    Patient ID: Ashley Jackson, female    DOB: 1961/04/08, 51 y.o.   MRN: [id number]  HPI Pt presents to clinic for followup of multiple medical problems. Does not check fsbs. Reviewed last a1c 6.7. H/o anxiety taking xanax tid and zoloft daily. Continues to smoke and not interested in cessation. Declines preventive care specficially mammogram and pap smears. Not utd on diabetic eye exam and declines as well. No active complaint.   Past Medical History  Diagnosis Date  . DIABETES MELLITUS, TYPE II, UNCONTROLLED 02/25/2010  . HYPERLIPIDEMIA 02/07/2008  . EXOGENOUS OBESITY 11/07/2007  . Overweight 06/24/2010  . Anxiety state, unspecified 07/12/2007  . TOBACCO USER 06/24/2010  . DEPRESSION 07/12/2007  . HYPERGLYCEMIA 07/12/2007  . COLONIC POLYPS, HX OF 05/02/2007   Past Surgical History  Procedure Date  . Colonoscopy 2006    reports that she has been smoking.  She has never used smokeless tobacco. She reports that she does not use illicit drugs. Her alcohol history not on file. family history includes Diabetes in her maternal uncle; Hypertension in her other; and Liver cancer in her mother.  There is no history of Breast cancer, and Prostate cancer, and Heart disease, . Allergies  Allergen Reactions  . Levaquin   . Sulfa Antibiotics   . Sulfonamide Derivatives     REACTION: Panic attacks     Review of Systems see hpi     Objective:   Physical Exam  Nursing note and vitals reviewed. Constitutional: She appears well-developed and well-nourished. No distress.  HENT:  Head: Normocephalic and atraumatic.  Right Ear: External ear normal.  Left Ear: External ear normal.  Eyes: Conjunctivae are normal. No scleral icterus.  Neck: Neck supple. No thyromegaly present.  Cardiovascular: Normal rate, regular rhythm and normal heart sounds.  Exam reveals no gallop and no friction rub.   No murmur heard. Pulmonary/Chest: Effort normal and breath sounds normal. No respiratory distress.  She has no wheezes. She has no rales.  Neurological: She is alert.  Skin: Skin is warm and dry. She is not diaphoretic.  Psychiatric: She has a normal mood and affect.          Assessment & Plan:

## 2011-11-24 ENCOUNTER — Other Ambulatory Visit: Payer: Self-pay | Admitting: Internal Medicine

## 2011-11-24 DIAGNOSIS — IMO0001 Reserved for inherently not codable concepts without codable children: Secondary | ICD-10-CM

## 2011-11-24 LAB — LIPID PANEL
Cholesterol: 204 mg/dL — ABNORMAL HIGH (ref 0–200)
LDL Cholesterol: 105 mg/dL — ABNORMAL HIGH (ref 0–99)
Total CHOL/HDL Ratio: 3.1 Ratio
VLDL: 34 mg/dL (ref 0–40)

## 2011-11-25 LAB — BASIC METABOLIC PANEL
BUN: 13 mg/dL (ref 6–23)
CO2: 25 mEq/L (ref 19–32)
Calcium: 8.9 mg/dL (ref 8.4–10.5)
Chloride: 107 mEq/L (ref 96–112)
Creat: 0.74 mg/dL (ref 0.50–1.10)
Glucose, Bld: 140 mg/dL — ABNORMAL HIGH (ref 70–99)

## 2011-12-01 ENCOUNTER — Telehealth: Payer: Self-pay | Admitting: Internal Medicine

## 2011-12-01 ENCOUNTER — Encounter: Payer: Self-pay | Admitting: Internal Medicine

## 2011-12-01 ENCOUNTER — Ambulatory Visit (INDEPENDENT_AMBULATORY_CARE_PROVIDER_SITE_OTHER): Payer: BC Managed Care – PPO | Admitting: Internal Medicine

## 2011-12-01 VITALS — BP 124/90 | HR 66 | Temp 98.1°F | Resp 16 | Ht 62.0 in | Wt 170.0 lb

## 2011-12-01 DIAGNOSIS — F17200 Nicotine dependence, unspecified, uncomplicated: Secondary | ICD-10-CM

## 2011-12-01 DIAGNOSIS — R030 Elevated blood-pressure reading, without diagnosis of hypertension: Secondary | ICD-10-CM

## 2011-12-01 DIAGNOSIS — I10 Essential (primary) hypertension: Secondary | ICD-10-CM | POA: Insufficient documentation

## 2011-12-01 DIAGNOSIS — E785 Hyperlipidemia, unspecified: Secondary | ICD-10-CM

## 2011-12-01 DIAGNOSIS — IMO0001 Reserved for inherently not codable concepts without codable children: Secondary | ICD-10-CM

## 2011-12-01 NOTE — Telephone Encounter (Signed)
Lab orders entered for July 2013. 

## 2011-12-01 NOTE — Patient Instructions (Signed)
Please schedule chem7, a1c 250.0 and lipid 272.4 prior to next visit 

## 2011-12-01 NOTE — Assessment & Plan Note (Signed)
Mildly suboptimal. Not interested in statin medication. Low fat diet, exercise and wt loss. Recheck lipid prior to next visit

## 2011-12-01 NOTE — Progress Notes (Signed)
  Subjective:    Patient ID: Ashley Jackson, female    DOB: 1960/12/28, 51 y.o.   MRN: [id number]  HPI Pt presents to clinic for followup of multiple medical problems. Reviewed mildly increasing a1c now 7.0. Does not check fsbs. ldl improved to 105. Declines tobacco cessation. Declines preventive care measures including pap smears, mammogram, colonoscopy, immunizations and eye exam. Using xanax prn for anxiety without evidence of addiction or tolerance.   Past Medical History  Diagnosis Date  . DIABETES MELLITUS, TYPE II, UNCONTROLLED 02/25/2010  . HYPERLIPIDEMIA 02/07/2008  . EXOGENOUS OBESITY 11/07/2007  . Overweight 06/24/2010  . Anxiety state, unspecified 07/12/2007  . TOBACCO USER 06/24/2010  . DEPRESSION 07/12/2007  . HYPERGLYCEMIA 07/12/2007  . COLONIC POLYPS, HX OF 05/02/2007   Past Surgical History  Procedure Date  . Colonoscopy 2006    reports that she has been smoking.  She has never used smokeless tobacco. She reports that she does not use illicit drugs. Her alcohol history not on file. family history includes Diabetes in her maternal uncle; Hypertension in her other; and Liver cancer in her mother.  There is no history of Breast cancer, and Prostate cancer, and Heart disease, . Allergies  Allergen Reactions  . Levaquin   . Sulfa Antibiotics   . Sulfonamide Derivatives     REACTION: Panic attacks      Review of Systems see hpi     Objective:   Physical Exam  Physical Exam  Nursing note and vitals reviewed. Constitutional: Appears well-developed and well-nourished. No distress.  HENT:  Head: Normocephalic and atraumatic.         Assessment & Plan:

## 2011-12-01 NOTE — Assessment & Plan Note (Signed)
Declines cessation

## 2011-12-01 NOTE — Assessment & Plan Note (Signed)
Minimal elevation. Pt relates to recent stress. Monitor bp and f/u in clinic as scheduled for recheck. Recommend low sodium diet, exercise and weight loss.

## 2011-12-01 NOTE — Assessment & Plan Note (Signed)
Increasing a1c. Recommend low dose metformin and declines. Will focus on diabetic diet, regular exercise and wt loss. Recheck a1c, chem7 prior to next visit

## 2012-01-10 ENCOUNTER — Ambulatory Visit: Payer: BC Managed Care – PPO | Admitting: Internal Medicine

## 2012-01-11 ENCOUNTER — Encounter: Payer: Self-pay | Admitting: Internal Medicine

## 2012-01-11 ENCOUNTER — Ambulatory Visit (INDEPENDENT_AMBULATORY_CARE_PROVIDER_SITE_OTHER): Payer: BC Managed Care – PPO | Admitting: Internal Medicine

## 2012-01-11 VITALS — BP 122/80 | HR 65 | Temp 98.2°F | Resp 16 | Wt 169.0 lb

## 2012-01-11 DIAGNOSIS — R300 Dysuria: Secondary | ICD-10-CM

## 2012-01-11 DIAGNOSIS — N390 Urinary tract infection, site not specified: Secondary | ICD-10-CM

## 2012-01-11 LAB — POCT URINALYSIS DIPSTICK
Glucose, UA: 250
Leukocytes, UA: NEGATIVE
Nitrite, UA: NEGATIVE
Urobilinogen, UA: 0.2

## 2012-01-11 MED ORDER — NITROFURANTOIN MONOHYD MACRO 100 MG PO CAPS: 100.0000 mg | ORAL_CAPSULE | Freq: Two times a day (BID) | ORAL | Status: AC

## 2012-01-22 DIAGNOSIS — N390 Urinary tract infection, site not specified: Secondary | ICD-10-CM | POA: Insufficient documentation

## 2012-01-22 NOTE — Progress Notes (Signed)
  Subjective:    Patient ID: Ashley Jackson, female    DOB: [birth year], 51 y.o.   MRN: [id number]  HPI Pt presents to clinic for evaluation of possible uti. Notes 6 d h/o burning with urination, urgency without f/c, n/v, abd pain, back pain or hematuria. No alleviating or exacerbating factors. No other complaints.  Past Medical History  Diagnosis Date  . DIABETES MELLITUS, TYPE II, UNCONTROLLED 02/25/2010  . HYPERLIPIDEMIA 02/07/2008  . EXOGENOUS OBESITY 11/07/2007  . Overweight 06/24/2010  . Anxiety state, unspecified 07/12/2007  . TOBACCO USER 06/24/2010  . DEPRESSION 07/12/2007  . HYPERGLYCEMIA 07/12/2007  . COLONIC POLYPS, HX OF 05/02/2007   Past Surgical History  Procedure Date  . Colonoscopy 2006    reports that she has been smoking.  She has never used smokeless tobacco. She reports that she does not use illicit drugs. Her alcohol history not on file. family history includes Diabetes in her maternal uncle; Hypertension in her other; and Liver cancer in her mother.  There is no history of Breast cancer, and Prostate cancer, and Heart disease, . Allergies  Allergen Reactions  . Levofloxacin   . Sulfa Antibiotics   . Sulfonamide Derivatives     REACTION: Panic attacks     Review of Systems see hpi     Objective:   Physical Exam  Nursing note and vitals reviewed. Constitutional: She appears well-developed and well-nourished. No distress.  Skin: She is not diaphoretic.          Assessment & Plan:

## 2012-01-22 NOTE — Assessment & Plan Note (Signed)
UA obtained and c/w uti. Begin abx course. Followup if no improvement or worsening.

## 2012-01-27 ENCOUNTER — Other Ambulatory Visit: Payer: Self-pay | Admitting: Internal Medicine

## 2012-01-27 NOTE — Telephone Encounter (Signed)
Ok same as previous

## 2012-01-27 NOTE — Telephone Encounter (Signed)
Verbal refill provided to pharmacist.  

## 2012-01-27 NOTE — Telephone Encounter (Signed)
Is it okay to provide refill on Xanax for this patient?

## 2012-03-01 LAB — BASIC METABOLIC PANEL
Calcium: 9.1 mg/dL (ref 8.4–10.5)
Glucose, Bld: 124 mg/dL — ABNORMAL HIGH (ref 70–99)
Potassium: 4.4 mEq/L (ref 3.5–5.3)
Sodium: 139 mEq/L (ref 135–145)

## 2012-03-01 LAB — HEMOGLOBIN A1C: Hgb A1c MFr Bld: 6.3 % — ABNORMAL HIGH (ref <5.7)

## 2012-03-01 NOTE — Addendum Note (Signed)
Addended by: Candie Echevaria L on: 03/01/2012 08:15 AM   Modules accepted: Orders

## 2012-03-02 LAB — LIPID PANEL
HDL: 60 mg/dL (ref >39)
LDL Cholesterol: 119 mg/dL — ABNORMAL HIGH (ref 0–99)

## 2012-03-08 ENCOUNTER — Encounter: Payer: Self-pay | Admitting: Internal Medicine

## 2012-03-08 ENCOUNTER — Ambulatory Visit (INDEPENDENT_AMBULATORY_CARE_PROVIDER_SITE_OTHER): Payer: BC Managed Care – PPO | Admitting: Internal Medicine

## 2012-03-08 VITALS — BP 126/82 | HR 78 | Temp 98.0°F | Resp 14 | Wt 172.5 lb

## 2012-03-08 DIAGNOSIS — F411 Generalized anxiety disorder: Secondary | ICD-10-CM

## 2012-03-08 DIAGNOSIS — E785 Hyperlipidemia, unspecified: Secondary | ICD-10-CM

## 2012-03-08 DIAGNOSIS — IMO0001 Reserved for inherently not codable concepts without codable children: Secondary | ICD-10-CM

## 2012-03-08 MED ORDER — ALPRAZOLAM 0.5 MG PO TABS: 0.5000 mg | ORAL_TABLET | Freq: Three times a day (TID) | ORAL | Status: DC | PRN

## 2012-03-08 NOTE — Progress Notes (Signed)
  Subjective:    Patient ID: Ashley Jackson, female    DOB: 11/11/1960, 51 y.o.   MRN: [id number]  HPI Pt presents to clinic for followup of multiple medical problems. Reviewed improved diabetic control. Is going to the gym and walking regularly as well. Using cinnamon daily. LDL remains above goal but not interested in statin tx. Continues to smoke and is aware of need for cessation. Notes recent increase in stressors with divorce. Taking xanax tid without evidence of addiction or tolerance.   Past Medical History  Diagnosis Date  . DIABETES MELLITUS, TYPE II, UNCONTROLLED 02/25/2010  . HYPERLIPIDEMIA 02/07/2008  . EXOGENOUS OBESITY 11/07/2007  . Overweight 06/24/2010  . Anxiety state, unspecified 07/12/2007  . TOBACCO USER 06/24/2010  . DEPRESSION 07/12/2007  . HYPERGLYCEMIA 07/12/2007  . COLONIC POLYPS, HX OF 05/02/2007   Past Surgical History  Procedure Date  . Colonoscopy 2006    reports that she has been smoking.  She has never used smokeless tobacco. She reports that she does not use illicit drugs. Her alcohol history not on file. family history includes Diabetes in her maternal uncle; Hypertension in her other; and Liver cancer in her mother.  There is no history of Breast cancer, and Prostate cancer, and Heart disease, . Allergies  Allergen Reactions  . Levofloxacin   . Sulfa Antibiotics   . Sulfonamide Derivatives     REACTION: Panic attacks      Review of Systems see hpi     Objective:   Physical Exam  Physical Exam  Nursing note and vitals reviewed. Constitutional: Appears well-developed and well-nourished. No distress.  HENT:  Head: Normocephalic and atraumatic.  Right Ear: External ear normal.  Left Ear: External ear normal.  Eyes: Conjunctivae are normal. No scleral icterus.  Neck: Neck supple. Carotid bruit is not present.  Cardiovascular: Normal rate, regular rhythm and normal heart sounds.  Exam reveals no gallop and no friction rub.   No murmur  heard. Pulmonary/Chest: Effort normal and breath sounds normal. No respiratory distress. He has no wheezes. no rales.  Lymphadenopathy:    He has no cervical adenopathy.  Neurological:Alert.  Skin: Skin is warm and dry. Not diaphoretic.  Psychiatric: Has a normal mood and affect.        Assessment & Plan:

## 2012-03-08 NOTE — Assessment & Plan Note (Signed)
Above ldl target goal. Declines statin tx.

## 2012-03-08 NOTE — Assessment & Plan Note (Signed)
Now at goal. Encouraged to continue regular exercise and diabetic diet. Obtain chem7, a1c prior to next visit

## 2012-03-08 NOTE — Assessment & Plan Note (Signed)
rf xanax. Use sparingly. Reminded of potential for addiction and/or tolerance.

## 2012-03-08 NOTE — Patient Instructions (Addendum)
Please schedule labs prior to next visit Chem7, a1c-250.00 

## 2012-06-05 ENCOUNTER — Other Ambulatory Visit: Payer: Self-pay | Admitting: Internal Medicine

## 2012-06-06 ENCOUNTER — Other Ambulatory Visit: Payer: Self-pay | Admitting: *Deleted

## 2012-06-06 MED ORDER — ALPRAZOLAM 0.5 MG PO TABS: 0.5000 mg | ORAL_TABLET | Freq: Three times a day (TID) | ORAL | Status: DC | PRN

## 2012-06-06 NOTE — Telephone Encounter (Signed)
Ok with rf 2 

## 2012-06-06 NOTE — Telephone Encounter (Signed)
Rx to pharmacy/[facility]S 

## 2012-06-06 NOTE — Telephone Encounter (Signed)
Pt called requesting refill on alprazolam; last refill 03/08/12 x 1 refill.

## 2012-06-21 ENCOUNTER — Other Ambulatory Visit: Payer: Self-pay | Admitting: *Deleted

## 2012-06-21 DIAGNOSIS — E119 Type 2 diabetes mellitus without complications: Secondary | ICD-10-CM

## 2012-06-21 LAB — BASIC METABOLIC PANEL
CO2: 26 mEq/L (ref 19–32)
Calcium: 9.2 mg/dL (ref 8.4–10.5)
Sodium: 141 mEq/L (ref 135–145)

## 2012-06-21 NOTE — Progress Notes (Signed)
Lab orders placed & released/SLS 

## 2012-06-26 ENCOUNTER — Ambulatory Visit: Payer: BC Managed Care – PPO | Admitting: Internal Medicine

## 2012-07-03 ENCOUNTER — Ambulatory Visit (INDEPENDENT_AMBULATORY_CARE_PROVIDER_SITE_OTHER): Payer: BC Managed Care – PPO | Admitting: Internal Medicine

## 2012-07-03 ENCOUNTER — Encounter: Payer: Self-pay | Admitting: Internal Medicine

## 2012-07-03 VITALS — BP 118/78 | HR 79 | Temp 97.8°F | Resp 14 | Wt 164.0 lb

## 2012-07-03 DIAGNOSIS — E785 Hyperlipidemia, unspecified: Secondary | ICD-10-CM

## 2012-07-03 DIAGNOSIS — IMO0001 Reserved for inherently not codable concepts without codable children: Secondary | ICD-10-CM

## 2012-07-03 NOTE — Patient Instructions (Signed)
Please schedule fasting labs prior to next visit Chem7, a1c, urine microalbumin-250.00 and lipid-272.4 

## 2012-07-08 NOTE — Assessment & Plan Note (Signed)
Average control. Continue dietary modification exercise and weight loss.

## 2012-07-08 NOTE — Assessment & Plan Note (Signed)
Suboptimal control. Declines statin therapy. Low-fat diet exercise and weight loss. Recheck fasting lipid profile prior to next visit

## 2012-07-08 NOTE — Progress Notes (Signed)
  Subjective:    Patient ID: Ashley Jackson, female    DOB: 1961/08/17, 51 y.o.   MRN: [id number]  HPI Pt presents to clinic for followup of multiple medical problems. Has lost a pound since last visit. Going to gym and exercising regularly. A1c reviewed stable remaining under average control. Declines statin therapy. Declines influenza vaccine. Continues to smoke and not interested in tobacco cessation.   Past Medical History  Diagnosis Date  . DIABETES MELLITUS, TYPE II, UNCONTROLLED 02/25/2010  . HYPERLIPIDEMIA 02/07/2008  . EXOGENOUS OBESITY 11/07/2007  . Overweight 06/24/2010  . Anxiety state, unspecified 07/12/2007  . TOBACCO USER 06/24/2010  . DEPRESSION 07/12/2007  . HYPERGLYCEMIA 07/12/2007  . COLONIC POLYPS, HX OF 05/02/2007   Past Surgical History  Procedure Date  . Colonoscopy 2006    reports that she has been smoking.  She has never used smokeless tobacco. She reports that she does not use illicit drugs. Her alcohol history not on file. family history includes Diabetes in her maternal uncle; Hypertension in her other; and Liver cancer in her mother.  There is no history of Breast cancer, and Prostate cancer, and Heart disease, . Allergies  Allergen Reactions  . Levofloxacin   . Sulfa Antibiotics   . Sulfonamide Derivatives     REACTION: Panic attacks      Review of Systems see hpi      Objective:   Physical Exam  Physical Exam  Nursing note and vitals reviewed. Constitutional: Appears well-developed and well-nourished. No distress.  HENT:  Head: Normocephalic and atraumatic.  Right Ear: External ear normal.  Left Ear: External ear normal.  Eyes: Conjunctivae are normal. No scleral icterus.  Neck: Neck supple. Carotid bruit is not present.  Cardiovascular: Normal rate, regular rhythm and normal heart sounds.  Exam reveals no gallop and no friction rub.   No murmur heard. Pulmonary/Chest: Effort normal and breath sounds normal. No respiratory distress. He has no  wheezes. no rales.  Lymphadenopathy:    He has no cervical adenopathy.  Neurological:Alert.  Skin: Skin is warm and dry. Not diaphoretic.  Psychiatric: Has a normal mood and affect.        Assessment & Plan:

## 2012-09-18 ENCOUNTER — Other Ambulatory Visit: Payer: Self-pay | Admitting: Internal Medicine

## 2012-10-20 ENCOUNTER — Telehealth: Payer: Self-pay | Admitting: *Deleted

## 2012-10-20 DIAGNOSIS — E785 Hyperlipidemia, unspecified: Secondary | ICD-10-CM

## 2012-10-20 DIAGNOSIS — IMO0001 Reserved for inherently not codable concepts without codable children: Secondary | ICD-10-CM

## 2012-10-20 LAB — LIPID PANEL
Cholesterol: 224 mg/dL — ABNORMAL HIGH (ref 0–200)
Total CHOL/HDL Ratio: 3.2 Ratio
VLDL: 28 mg/dL (ref 0–40)

## 2012-10-20 LAB — HEMOGLOBIN A1C
Hgb A1c MFr Bld: 6.4 % — ABNORMAL HIGH (ref <5.7)
Mean Plasma Glucose: 137 mg/dL — ABNORMAL HIGH (ref <117)

## 2012-10-20 LAB — BASIC METABOLIC PANEL
Glucose, Bld: 107 mg/dL — ABNORMAL HIGH (ref 70–99)
Potassium: 4.8 mEq/L (ref 3.5–5.3)
Sodium: 141 mEq/L (ref 135–145)

## 2012-10-20 NOTE — Telephone Encounter (Signed)
Pt presented to the lab. Orders entered per 06/2012 office note as below:  Please schedule fasting labs prior to next visit  Chem7, a1c, urine microalbumin-250.00 and lipid-272.4

## 2012-10-22 ENCOUNTER — Telehealth: Payer: Self-pay | Admitting: Family

## 2012-10-22 NOTE — Telephone Encounter (Addendum)
Pls call pt and let her know that cholesterol is high. I would like her to work hard on low fat diet and exercise.  Sugar appear well controlled. Repeat FLP in 6 months.

## 2012-10-23 NOTE — Telephone Encounter (Signed)
Left message on home # to return my call. 

## 2012-10-23 NOTE — Telephone Encounter (Signed)
Notified pt. 

## 2012-10-24 ENCOUNTER — Encounter: Payer: Self-pay | Admitting: Family Medicine

## 2012-10-24 ENCOUNTER — Ambulatory Visit (INDEPENDENT_AMBULATORY_CARE_PROVIDER_SITE_OTHER): Payer: BC Managed Care – PPO | Admitting: Family Medicine

## 2012-10-24 VITALS — BP 144/80 | HR 61 | Temp 97.9°F | Ht 62.0 in | Wt 160.0 lb

## 2012-10-24 DIAGNOSIS — R030 Elevated blood-pressure reading, without diagnosis of hypertension: Secondary | ICD-10-CM

## 2012-10-24 DIAGNOSIS — Z720 Tobacco use: Secondary | ICD-10-CM

## 2012-10-24 DIAGNOSIS — F411 Generalized anxiety disorder: Secondary | ICD-10-CM

## 2012-10-24 DIAGNOSIS — IMO0001 Reserved for inherently not codable concepts without codable children: Secondary | ICD-10-CM

## 2012-10-24 DIAGNOSIS — F17200 Nicotine dependence, unspecified, uncomplicated: Secondary | ICD-10-CM

## 2012-10-24 DIAGNOSIS — E663 Overweight: Secondary | ICD-10-CM

## 2012-10-24 HISTORY — DX: Tobacco use: Z72.0

## 2012-10-24 MED ORDER — ALPRAZOLAM 0.5 MG PO TABS: 0.5000 mg | ORAL_TABLET | Freq: Three times a day (TID) | ORAL | Status: DC | PRN

## 2012-10-24 MED ORDER — SERTRALINE HCL 100 MG PO TABS: 100.0000 mg | ORAL_TABLET | Freq: Every day | ORAL | Status: DC

## 2012-10-24 NOTE — Patient Instructions (Addendum)
Krill oil (MegaRed caps) daily Quit cigarettes, call if you want any testing  Next appt. Annual  Labs prior lipid, renal, cbc, tsh, hepatic, renal  DASH diet  Hypercholesterolemia High Blood Cholesterol Cholesterol is a white, waxy, fat-like protein needed by your body in small amounts. The liver makes all the cholesterol you need. It is carried from the liver by the blood through the blood vessels. Deposits (plaque) may build up on blood vessel walls. This makes the arteries narrower and stiffer. Plaque increases the risk for heart attack and stroke. You cannot feel your cholesterol level even if it is very high. The only way to know is by a blood test to check your lipid (fats) levels. Once you know your cholesterol levels, you should keep a record of the test results. Work with your caregiver to to keep your levels in the desired range. WHAT THE RESULTS MEAN:  Total cholesterol is a rough measure of all the cholesterol in your blood.  LDL is the so-called bad cholesterol. This is the type that deposits cholesterol in the walls of the arteries. You want this level to be low.  HDL is the good cholesterol because it cleans the arteries and carries the LDL away. You want this level to be high.  Triglycerides are fat that the body can either burn for energy or store. High levels are closely linked to heart disease. DESIRED LEVELS:  Total cholesterol below 200.  LDL below 100 for people at risk, below 70 for very high risk.  HDL above 50 is good, above 60 is best.  Triglycerides below 150. HOW TO LOWER YOUR CHOLESTEROL:  Diet.  Choose fish or white meat chicken and [place], roasted or baked. Limit fatty cuts of red meat, fried foods, and processed meats, such as sausage and lunch meat.  Eat lots of fresh fruits and vegetables. Choose whole grains, beans, pasta, potatoes and cereals.  Use only small amounts of olive, corn or canola oils. Avoid butter, mayonnaise, shortening or palm  kernel oils. Avoid foods with trans-fats.  Use skim/nonfat milk and low-fat/nonfat yogurt and cheeses. Avoid whole milk, cream, ice cream, egg yolks and cheeses. Healthy desserts include angel food cake, gingersnaps, animal crackers, hard candy, popsicles, and low-fat/nonfat frozen yogurt. Avoid pastries, cakes, pies and cookies.  Exercise.  A regular program helps decrease LDL and raises HDL.  Helps with weight control.  Do things that increase your activity level like gardening, walking, or taking the stairs.  Medication.  May be prescribed by your caregiver to help lowering cholesterol and the risk for heart disease.  You may need medicine even if your levels are normal if you have several risk factors. HOME CARE INSTRUCTIONS   Follow your diet and exercise programs as suggested by your caregiver.  Take medications as directed.  Have blood work done when your caregiver feels it is necessary. MAKE SURE YOU:   Understand these instructions.  Will watch your condition.  Will get help right away if you are not doing well or get worse. Document Released: 08/09/2005 Document Revised: 11/01/2011 Document Reviewed: 01/25/2007 [facility] Patient Information 2013 [place], [place].

## 2012-10-29 NOTE — Assessment & Plan Note (Signed)
Persistent weight loss, encouraged DASH diet and increased exercise

## 2012-10-29 NOTE — Progress Notes (Signed)
Patient ID: Ashley Jackson, female   DOB: 1961-06-07, 52 y.o.   MRN: [id number] Ashley Jackson [id number] [birth year] 10/29/2012      Progress Note-Follow Up  Subjective  Chief Complaint  Chief Complaint  Patient presents with  . Follow-up    4 month    HPI  Patient general Caucasian female who is in today for followup. She feels well. No recent illness. No headaches, congestion, fevers, chills, chest pain palpitations, shortness of breath, GI or GU complaints. Does not generally check her blood sugars. Does try and minimize her simple carbohydrates  Past Medical History  Diagnosis Date  . DIABETES MELLITUS, TYPE II, UNCONTROLLED 02/25/2010  . HYPERLIPIDEMIA 02/07/2008  . EXOGENOUS OBESITY 11/07/2007  . Overweight 06/24/2010  . Anxiety state, unspecified 07/12/2007  . TOBACCO USER 06/24/2010  . DEPRESSION 07/12/2007  . HYPERGLYCEMIA 07/12/2007  . COLONIC POLYPS, HX OF 05/02/2007  . Tobacco abuse 10/24/2012    Past Surgical History  Procedure Laterality Date  . Colonoscopy  2006    Family History  Problem Relation Age of Onset  . Liver cancer Mother   . Hypertension Other   . Breast cancer Neg Hx   . Diabetes Maternal Uncle   . Prostate cancer Neg Hx   . Heart disease Neg Hx     History   Social History  . Marital Status: Married    Spouse Name: N/A    Number of Children: N/A  . Years of Education: N/A   Occupational History  . Not on file.   Social History Main Topics  . Smoking status: Current Every Day Smoker -- 1.00 packs/day  . Smokeless tobacco: Never Used  . Alcohol Use:   . Drug Use: No  . Sexually Active: Not Currently   Other Topics Concern  . Not on file   Social History Narrative  . No narrative on file    No current outpatient prescriptions on file prior to visit.   No current facility-administered medications on file prior to visit.    Allergies  Allergen Reactions  . Levofloxacin   . Sulfa Antibiotics   . Sulfonamide Derivatives      REACTION: Panic attacks    Review of Systems  Review of Systems  Constitutional: Negative for fever and malaise/fatigue.  HENT: Negative for congestion.   Eyes: Negative for discharge.  Respiratory: Negative for shortness of breath.   Cardiovascular: Negative for chest pain, palpitations and leg swelling.  Gastrointestinal: Negative for nausea, abdominal pain and diarrhea.  Genitourinary: Negative for dysuria.  Musculoskeletal: Negative for falls.  Skin: Negative for rash.  Neurological: Negative for loss of consciousness and headaches.  Endo/Heme/Allergies: Negative for polydipsia.  Psychiatric/Behavioral: Negative for depression and suicidal ideas. The patient is not nervous/anxious and does not have insomnia.     Objective  BP 144/80  Pulse 61  Temp(Src) 97.9 F (36.6 C) (Oral)  Ht 5\' 2"  (1.575 m)  Wt 160 lb (72.576 kg)  BMI 29.26 kg/m2  SpO2 98%  LMP 10/20/2012  Physical Exam  Physical Exam  Constitutional: She is oriented to person, place, and time and well-developed, well-nourished, and in no distress. No distress.  HENT:  Head: Normocephalic and atraumatic.  Eyes: Conjunctivae are normal.  Neck: Neck supple. No thyromegaly present.  Cardiovascular: Normal rate, regular rhythm and normal heart sounds.   No murmur heard. Pulmonary/Chest: Effort normal and breath sounds normal. She has no wheezes.  Abdominal: She exhibits no distension and no mass.  Musculoskeletal: She exhibits  no edema.  Lymphadenopathy:    She has no cervical adenopathy.  Neurological: She is alert and oriented to person, place, and time.  Skin: Skin is warm and dry. No rash noted. She is not diaphoretic.  Psychiatric: Memory, affect and judgment normal.    Lab Results  Component Value Date   TSH 1.17 09/30/2010   Lab Results  Component Value Date   WBC 7.6 09/01/2011   HGB 12.9 09/01/2011   HCT 40.4 09/01/2011   MCV 88.4 09/01/2011   PLT 363 09/01/2011   Lab Results  Component Value Date    CREATININE 0.90 10/20/2012   BUN 14 10/20/2012   NA 141 10/20/2012   K 4.8 10/20/2012   CL 105 10/20/2012   CO2 25 10/20/2012   Lab Results  Component Value Date   ALT 9 09/01/2011   AST 14 09/01/2011   ALKPHOS 50 09/01/2011   BILITOT 0.3 09/01/2011   Lab Results  Component Value Date   CHOL 224* 10/20/2012   Lab Results  Component Value Date   HDL 70 10/20/2012   Lab Results  Component Value Date   LDLCALC 126* 10/20/2012   Lab Results  Component Value Date   TRIG 138 10/20/2012   Lab Results  Component Value Date   CHOLHDL 3.2 10/20/2012     Assessment & Plan  OVERWEIGHT Persistent weight loss, encouraged DASH diet and increased exercise  TOBACCO USER Encouraged ongoing attempts at cessation  Elevated blood pressure Mild elevation, encouraged DASH diet and increased exercise, improved on recheck

## 2012-10-29 NOTE — Assessment & Plan Note (Signed)
Encouraged ongoing attempts at cessation

## 2012-10-29 NOTE — Assessment & Plan Note (Signed)
Mild elevation, encouraged DASH diet and increased exercise, improved on recheck

## 2013-03-13 ENCOUNTER — Other Ambulatory Visit: Payer: Self-pay | Admitting: Family Medicine

## 2013-03-13 NOTE — Telephone Encounter (Signed)
RX faxed

## 2013-03-13 NOTE — Telephone Encounter (Signed)
Please advise refill? Last RX was done on 10-24-12 quantity 90 with 2 refills  If ok fax to [phone]

## 2013-04-17 ENCOUNTER — Telehealth: Payer: Self-pay | Admitting: *Deleted

## 2013-04-17 DIAGNOSIS — Z0000 Encounter for general adult medical examination without abnormal findings: Secondary | ICD-10-CM

## 2013-04-17 LAB — RENAL FUNCTION PANEL
BUN: 9 mg/dL (ref 6–23)
CO2: 28 mEq/L (ref 19–32)
Chloride: 103 mEq/L (ref 96–112)
Creat: 0.82 mg/dL (ref 0.50–1.10)
Glucose, Bld: 122 mg/dL — ABNORMAL HIGH (ref 70–99)
Phosphorus: 3.3 mg/dL (ref 2.3–4.6)

## 2013-04-17 LAB — CBC
HCT: 39 % (ref 36.0–46.0)
MCH: 28.7 pg (ref 26.0–34.0)
MCHC: 33.6 g/dL (ref 30.0–36.0)
MCV: 85.5 fL (ref 78.0–100.0)
RDW: 14.7 % (ref 11.5–15.5)

## 2013-04-17 LAB — HEPATIC FUNCTION PANEL
Alkaline Phosphatase: 44 U/L (ref 39–117)
Indirect Bilirubin: 0.4 mg/dL (ref 0.0–0.9)
Total Protein: 6.9 g/dL (ref 6.0–8.3)

## 2013-04-17 LAB — LIPID PANEL
LDL Cholesterol: 118 mg/dL — ABNORMAL HIGH (ref 0–99)
Triglycerides: 196 mg/dL — ABNORMAL HIGH (ref <150)

## 2013-04-17 MED ORDER — ALPRAZOLAM 0.5 MG PO TABS: 0.5000 mg | ORAL_TABLET | Freq: Three times a day (TID) | ORAL | Status: DC | PRN

## 2013-04-17 NOTE — Telephone Encounter (Signed)
Faxed refill request received from pharmacy for Alprazolam Last filled by MD on 07.22.14, #[id number] Last AEX - 03.04.14 Next AEX - 6 Months Please Advise/SLS

## 2013-04-17 NOTE — Telephone Encounter (Signed)
Pt presented to the lab. Orders entered per 10/2012 office note as below:  Next appt. Annual  Labs prior lipid, renal, cbc, tsh, hepatic, renal

## 2013-04-17 NOTE — Telephone Encounter (Signed)
So to stay on Alprazolam this frequently she needs an appt at least every 6 months. Which will be this coming month so let her have a 30 day supply but warn her she needs an appt to get further refills

## 2013-04-17 NOTE — Telephone Encounter (Signed)
Patient informed and states she has an appt on 04-27-13.  RX sent

## 2013-04-27 ENCOUNTER — Encounter: Payer: Self-pay | Admitting: Family Medicine

## 2013-04-27 ENCOUNTER — Ambulatory Visit (INDEPENDENT_AMBULATORY_CARE_PROVIDER_SITE_OTHER): Payer: BC Managed Care – PPO | Admitting: Family Medicine

## 2013-04-27 VITALS — BP 120/84 | HR 70 | Temp 97.5°F | Ht 62.0 in | Wt 175.0 lb

## 2013-04-27 DIAGNOSIS — E119 Type 2 diabetes mellitus without complications: Secondary | ICD-10-CM

## 2013-04-27 DIAGNOSIS — F411 Generalized anxiety disorder: Secondary | ICD-10-CM

## 2013-04-27 DIAGNOSIS — F341 Dysthymic disorder: Secondary | ICD-10-CM

## 2013-04-27 DIAGNOSIS — F329 Major depressive disorder, single episode, unspecified: Secondary | ICD-10-CM

## 2013-04-27 DIAGNOSIS — R030 Elevated blood-pressure reading, without diagnosis of hypertension: Secondary | ICD-10-CM

## 2013-04-27 DIAGNOSIS — M545 Low back pain, unspecified: Secondary | ICD-10-CM

## 2013-04-27 DIAGNOSIS — F32A Depression, unspecified: Secondary | ICD-10-CM

## 2013-04-27 DIAGNOSIS — F17200 Nicotine dependence, unspecified, uncomplicated: Secondary | ICD-10-CM

## 2013-04-27 DIAGNOSIS — IMO0001 Reserved for inherently not codable concepts without codable children: Secondary | ICD-10-CM

## 2013-04-27 DIAGNOSIS — Z0000 Encounter for general adult medical examination without abnormal findings: Secondary | ICD-10-CM

## 2013-04-27 DIAGNOSIS — E785 Hyperlipidemia, unspecified: Secondary | ICD-10-CM

## 2013-04-27 MED ORDER — SERTRALINE HCL 100 MG PO TABS: 100.0000 mg | ORAL_TABLET | Freq: Every day | ORAL | Status: DC

## 2013-04-27 MED ORDER — ALPRAZOLAM 0.5 MG PO TABS: 0.5000 mg | ORAL_TABLET | Freq: Three times a day (TID) | ORAL | Status: DC | PRN

## 2013-04-27 NOTE — Patient Instructions (Addendum)
Red Yeast Rice and Co Q 10 enzyme for cholesterol Quit smoking Proceed with colonoscopy  Preventive Care for Adults, Female A healthy lifestyle and preventive care can promote health and wellness. Preventive health guidelines for women include the following key practices.  A routine yearly physical is a good way to check with your caregiver about your health and preventive screening. It is a chance to share any concerns and updates on your health, and to receive a thorough exam.  Visit your dentist for a routine exam and preventive care every 6 months. Brush your teeth twice a day and floss once a day. Good oral hygiene prevents tooth decay and gum disease.  The frequency of eye exams is based on your age, health, family medical history, use of contact lenses, and other factors. Follow your caregiver's recommendations for frequency of eye exams.  Eat a healthy diet. Foods like vegetables, fruits, whole grains, low-fat dairy products, and lean protein foods contain the nutrients you need without too many calories. Decrease your intake of foods high in solid fats, added sugars, and salt. Eat the right amount of calories for you.Get information about a proper diet from your caregiver, if necessary.  Regular physical exercise is one of the most important things you can do for your health. Most adults should get at least 150 minutes of moderate-intensity exercise (any activity that increases your heart rate and causes you to sweat) each week. In addition, most adults need muscle-strengthening exercises on 2 or more days a week.  Maintain a healthy weight. The body mass index (BMI) is a screening tool to identify possible weight problems. It provides an estimate of body fat based on height and weight. Your caregiver can help determine your BMI, and can help you achieve or maintain a healthy weight.For adults 20 years and older:  A BMI below 18.5 is considered underweight.  A BMI of 18.5 to 24.9 is  normal.  A BMI of 25 to 29.9 is considered overweight.  A BMI of 30 and above is considered obese.  Maintain normal blood lipids and cholesterol levels by exercising and minimizing your intake of saturated fat. Eat a balanced diet with plenty of fruit and vegetables. Blood tests for lipids and cholesterol should begin at age 37 and be repeated every 5 years. If your lipid or cholesterol levels are high, you are over 50, or you are at high risk for heart disease, you may need your cholesterol levels checked more frequently.Ongoing high lipid and cholesterol levels should be treated with medicines if diet and exercise are not effective.  If you smoke, find out from your caregiver how to quit. If you do not use tobacco, do not start.  If you are pregnant, do not drink alcohol. If you are breastfeeding, be very cautious about drinking alcohol. If you are not pregnant and choose to drink alcohol, do not exceed 1 drink per day. One drink is considered to be 12 ounces (355 mL) of beer, 5 ounces (148 mL) of wine, or 1.5 ounces (44 mL) of liquor.  Avoid use of street drugs. Do not share needles with anyone. Ask for help if you need support or instructions about stopping the use of drugs.  High blood pressure causes heart disease and increases the risk of stroke. Your blood pressure should be checked at least every 1 to 2 years. Ongoing high blood pressure should be treated with medicines if weight loss and exercise are not effective.  If you are 55 to  52 years old, ask your caregiver if you should take aspirin to prevent strokes.  Diabetes screening involves taking a blood sample to check your fasting blood sugar level. This should be done once every 3 years, after age 83, if you are within normal weight and without risk factors for diabetes. Testing should be considered at a younger age or be carried out more frequently if you are overweight and have at least 1 risk factor for diabetes.  Breast cancer  screening is essential preventive care for women. You should practice "breast self-awareness." This means understanding the normal appearance and feel of your breasts and may include breast self-examination. Any changes detected, no matter how small, should be reported to a caregiver. Women in their 72s and 30s should have a clinical breast exam (CBE) by a caregiver as part of a regular health exam every 1 to 3 years. After age 55, women should have a CBE every year. Starting at age 13, women should consider having a mammography (breast X-ray test) every year. Women who have a family history of breast cancer should talk to their caregiver about genetic screening. Women at a high risk of breast cancer should talk to their caregivers about having magnetic resonance imaging (MRI) and a mammography every year.  The Pap test is a screening test for cervical cancer. A Pap test can show cell changes on the cervix that might become cervical cancer if left untreated. A Pap test is a procedure in which cells are obtained and examined from the lower end of the uterus (cervix).  Women should have a Pap test starting at age 66.  Between ages 23 and 21, Pap tests should be repeated every 2 years.  Beginning at age 11, you should have a Pap test every 3 years as long as the past 3 Pap tests have been normal.  Some women have medical problems that increase the chance of getting cervical cancer. Talk to your caregiver about these problems. It is especially important to talk to your caregiver if a new problem develops soon after your last Pap test. In these cases, your caregiver may recommend more frequent screening and Pap tests.  The above recommendations are the same for women who have or have not gotten the vaccine for human papillomavirus (HPV).  If you had a hysterectomy for a problem that was not cancer or a condition that could lead to cancer, then you no longer need Pap tests. Even if you no longer need a Pap  test, a regular exam is a good idea to make sure no other problems are starting.  If you are between ages 82 and 52, and you have had normal Pap tests going back 10 years, you no longer need Pap tests. Even if you no longer need a Pap test, a regular exam is a good idea to make sure no other problems are starting.  If you have had past treatment for cervical cancer or a condition that could lead to cancer, you need Pap tests and screening for cancer for at least 20 years after your treatment.  If Pap tests have been discontinued, risk factors (such as a new sexual partner) need to be reassessed to determine if screening should be resumed.  The HPV test is an additional test that may be used for cervical cancer screening. The HPV test looks for the virus that can cause the cell changes on the cervix. The cells collected during the Pap test can be tested for HPV.  The HPV test could be used to screen women aged 78 years and older, and should be used in women of any age who have unclear Pap test results. After the age of 11, women should have HPV testing at the same frequency as a Pap test.  Colorectal cancer can be detected and often prevented. Most routine colorectal cancer screening begins at the age of 61 and continues through age 80. However, your caregiver may recommend screening at an earlier age if you have risk factors for colon cancer. On a yearly basis, your caregiver may provide home test kits to check for hidden blood in the stool. Use of a small camera at the end of a tube, to directly examine the colon (sigmoidoscopy or colonoscopy), can detect the earliest forms of colorectal cancer. Talk to your caregiver about this at age 57, when routine screening begins. Direct examination of the colon should be repeated every 5 to 10 years through age 58, unless early forms of pre-cancerous polyps or small growths are found.  Hepatitis C blood testing is recommended for all people born from [birth year] through  1965 and any individual with known risks for hepatitis C.  Practice safe sex. Use condoms and avoid high-risk sexual practices to reduce the spread of sexually transmitted infections (STIs). STIs include gonorrhea, chlamydia, syphilis, trichomonas, herpes, HPV, and human immunodeficiency virus (HIV). Herpes, HIV, and HPV are viral illnesses that have no cure. They can result in disability, cancer, and death. Sexually active women aged 33 and younger should be checked for chlamydia. Older women with new or multiple partners should also be tested for chlamydia. Testing for other STIs is recommended if you are sexually active and at increased risk.  Osteoporosis is a disease in which the bones lose minerals and strength with aging. This can result in serious bone fractures. The risk of osteoporosis can be identified using a bone density scan. Women ages 44 and over and women at risk for fractures or osteoporosis should discuss screening with their caregivers. Ask your caregiver whether you should take a calcium supplement or vitamin D to reduce the rate of osteoporosis.  Menopause can be associated with physical symptoms and risks. Hormone replacement therapy is available to decrease symptoms and risks. You should talk to your caregiver about whether hormone replacement therapy is right for you.  Use sunscreen with sun protection factor (SPF) of 30 or more. Apply sunscreen liberally and repeatedly throughout the day. You should seek shade when your shadow is shorter than you. Protect yourself by wearing long sleeves, pants, a wide-brimmed hat, and sunglasses year round, whenever you are outdoors.  Once a month, do a whole body skin exam, using a mirror to look at the skin on your back. Notify your caregiver of new moles, moles that have irregular borders, moles that are larger than a pencil eraser, or moles that have changed in shape or color.  Stay current with required immunizations.  Influenza. You  need a dose every fall (or winter). The composition of the flu vaccine changes each year, so being vaccinated once is not enough.  Pneumococcal polysaccharide. You need 1 to 2 doses if you smoke cigarettes or if you have certain chronic medical conditions. You need 1 dose at age 12 (or older) if you have never been vaccinated.  Tetanus, diphtheria, pertussis (Tdap, Td). Get 1 dose of Tdap vaccine if you are younger than age 52, are over 48 and have contact with an infant, are a Research scientist (physical sciences), are  pregnant, or simply want to be protected from whooping cough. After that, you need a Td booster dose every 10 years. Consult your caregiver if you have not had at least 3 tetanus and diphtheria-containing shots sometime in your life or have a deep or dirty wound.  HPV. You need this vaccine if you are a woman age 57 or younger. The vaccine is given in 3 doses over 6 months.  Measles, mumps, rubella (MMR). You need at least 1 dose of MMR if you were born in 1957 or later. You may also need a second dose.  Meningococcal. If you are age 13 to 92 and a first-year college student living in a residence hall, or have one of several medical conditions, you need to get vaccinated against meningococcal disease. You may also need additional booster doses.  Zoster (shingles). If you are age 95 or older, you should get this vaccine.  Varicella (chickenpox). If you have never had chickenpox or you were vaccinated but received only 1 dose, talk to your caregiver to find out if you need this vaccine.  Hepatitis A. You need this vaccine if you have a specific risk factor for hepatitis A virus infection or you simply wish to be protected from this disease. The vaccine is usually given as 2 doses, 6 to 18 months apart.  Hepatitis B. You need this vaccine if you have a specific risk factor for hepatitis B virus infection or you simply wish to be protected from this disease. The vaccine is given in 3 doses, usually over 6  months. Preventive Services / Frequency Ages 41 to 68  Blood pressure check.** / Every 1 to 2 years.  Lipid and cholesterol check.** / Every 5 years beginning at age 51.  Clinical breast exam.** / Every 3 years for women in their 21s and 30s.  Pap test.** / Every 2 years from ages 58 through 31. Every 3 years starting at age 40 through age 42 or 3 with a history of 3 consecutive normal Pap tests.  HPV screening.** / Every 3 years from ages 60 through ages 21 to 65 with a history of 3 consecutive normal Pap tests.  Hepatitis C blood test.** / For any individual with known risks for hepatitis C.  Skin self-exam. / Monthly.  Influenza immunization.** / Every year.  Pneumococcal polysaccharide immunization.** / 1 to 2 doses if you smoke cigarettes or if you have certain chronic medical conditions.  Tetanus, diphtheria, pertussis (Tdap, Td) immunization. / A one-time dose of Tdap vaccine. After that, you need a Td booster dose every 10 years.  HPV immunization. / 3 doses over 6 months, if you are 34 and younger.  Measles, mumps, rubella (MMR) immunization. / You need at least 1 dose of MMR if you were born in 1957 or later. You may also need a second dose.  Meningococcal immunization. / 1 dose if you are age 42 to 2 and a first-year college student living in a residence hall, or have one of several medical conditions, you need to get vaccinated against meningococcal disease. You may also need additional booster doses.  Varicella immunization.** / Consult your caregiver.  Hepatitis A immunization.** / Consult your caregiver. 2 doses, 6 to 18 months apart.  Hepatitis B immunization.** / Consult your caregiver. 3 doses usually over 6 months. Ages 26 to 28  Blood pressure check.** / Every 1 to 2 years.  Lipid and cholesterol check.** / Every 5 years beginning at age 40.  Clinical breast  exam.** / Every year after age 56.  Mammogram.** / Every year beginning at age 40 and  continuing for as long as you are in good health. Consult with your caregiver.  Pap test.** / Every 3 years starting at age 48 through age 55 or 78 with a history of 3 consecutive normal Pap tests.  HPV screening.** / Every 3 years from ages 68 through ages 58 to 1 with a history of 3 consecutive normal Pap tests.  Fecal occult blood test (FOBT) of stool. / Every year beginning at age 42 and continuing until age 4. You may not need to do this test if you get a colonoscopy every 10 years.  Flexible sigmoidoscopy or colonoscopy.** / Every 5 years for a flexible sigmoidoscopy or every 10 years for a colonoscopy beginning at age 68 and continuing until age 75.  Hepatitis C blood test.** / For all people born from [birth year] through 1965 and any individual with known risks for hepatitis C.  Skin self-exam. / Monthly.  Influenza immunization.** / Every year.  Pneumococcal polysaccharide immunization.** / 1 to 2 doses if you smoke cigarettes or if you have certain chronic medical conditions.  Tetanus, diphtheria, pertussis (Tdap, Td) immunization.** / A one-time dose of Tdap vaccine. After that, you need a Td booster dose every 10 years.  Measles, mumps, rubella (MMR) immunization. / You need at least 1 dose of MMR if you were born in 1957 or later. You may also need a second dose.  Varicella immunization.** / Consult your caregiver.  Meningococcal immunization.** / Consult your caregiver.  Hepatitis A immunization.** / Consult your caregiver. 2 doses, 6 to 18 months apart.  Hepatitis B immunization.** / Consult your caregiver. 3 doses, usually over 6 months. Ages 11 and over  Blood pressure check.** / Every 1 to 2 years.  Lipid and cholesterol check.** / Every 5 years beginning at age 62.  Clinical breast exam.** / Every year after age 41.  Mammogram.** / Every year beginning at age 33 and continuing for as long as you are in good health. Consult with your caregiver.  Pap test.** / Every  3 years starting at age 28 through age 69 or 27 with a 3 consecutive normal Pap tests. Testing can be stopped between 65 and 70 with 3 consecutive normal Pap tests and no abnormal Pap or HPV tests in the past 10 years.  HPV screening.** / Every 3 years from ages 15 through ages 76 or 30 with a history of 3 consecutive normal Pap tests. Testing can be stopped between 65 and 70 with 3 consecutive normal Pap tests and no abnormal Pap or HPV tests in the past 10 years.  Fecal occult blood test (FOBT) of stool. / Every year beginning at age 61 and continuing until age 53. You may not need to do this test if you get a colonoscopy every 10 years.  Flexible sigmoidoscopy or colonoscopy.** / Every 5 years for a flexible sigmoidoscopy or every 10 years for a colonoscopy beginning at age 52 and continuing until age 72.  Hepatitis C blood test.** / For all people born from [birth year] through 1965 and any individual with known risks for hepatitis C.  Osteoporosis screening.** / A one-time screening for women ages 23 and over and women at risk for fractures or osteoporosis.  Skin self-exam. / Monthly.  Influenza immunization.** / Every year.  Pneumococcal polysaccharide immunization.** / 1 dose at age 76 (or older) if you have never been vaccinated.  Tetanus,  diphtheria, pertussis (Tdap, Td) immunization. / A one-time dose of Tdap vaccine if you are over 65 and have contact with an infant, are a Research scientist (physical sciences), or simply want to be protected from whooping cough. After that, you need a Td booster dose every 10 years.  Varicella immunization.** / Consult your caregiver.  Meningococcal immunization.** / Consult your caregiver.  Hepatitis A immunization.** / Consult your caregiver. 2 doses, 6 to 18 months apart.  Hepatitis B immunization.** / Check with your caregiver. 3 doses, usually over 6 months. ** Family history and personal history of risk and conditions may change your caregiver's  recommendations. Document Released: 10/05/2001 Document Revised: 11/01/2011 Document Reviewed: 01/04/2011 [facility] Patient Information 2014 [place], [place].

## 2013-04-29 ENCOUNTER — Encounter: Payer: Self-pay | Admitting: Family Medicine

## 2013-04-29 DIAGNOSIS — M545 Low back pain, unspecified: Secondary | ICD-10-CM

## 2013-04-29 DIAGNOSIS — Z0000 Encounter for general adult medical examination without abnormal findings: Secondary | ICD-10-CM

## 2013-04-29 HISTORY — DX: Low back pain, unspecified: M54.50

## 2013-04-29 HISTORY — DX: Encounter for general adult medical examination without abnormal findings: Z00.00

## 2013-04-29 NOTE — Assessment & Plan Note (Signed)
Acceptable hgba1c, avoid simple carbs. Encouraged DASH diet.

## 2013-04-29 NOTE — Progress Notes (Signed)
Patient ID: Ashley Jackson, female   DOB: 09-Sep-1960, 52 y.o.   MRN: [id number] Ashley Jackson [id number] [birth year] 04/29/2013      Progress Note New Patient  Subjective  Chief Complaint  Chief Complaint  Patient presents with  . Annual Exam    physical    HPI  Patient is a 52 year old female who is in today for annual exam. Unfortunately she continues to smoke. She's not had a Pap smear in over 10 years. She's never had a mammogram. She had a colonoscopy 20 years ago and they found benign polyps. No recent illness. No chest pain, palpitations, shortness of breath, GI or GU complaints and is having some intermittent low back pain worse after prolonged standing and heavy lifting.   Past Medical History  Diagnosis Date  . DIABETES MELLITUS, TYPE II, UNCONTROLLED 02/25/2010  . HYPERLIPIDEMIA 02/07/2008  . EXOGENOUS OBESITY 11/07/2007  . Overweight(278.02) 06/24/2010  . Anxiety state, unspecified 07/12/2007  . TOBACCO USER 06/24/2010  . DEPRESSION 07/12/2007  . HYPERGLYCEMIA 07/12/2007  . COLONIC POLYPS, HX OF 05/02/2007  . Tobacco abuse 10/24/2012  . Preventative health care 04/29/2013    Past Surgical History  Procedure Laterality Date  . Colonoscopy  2006    Family History  Problem Relation Age of Onset  . Liver cancer Mother   . Hypertension Other   . Breast cancer Neg Hx   . Prostate cancer Neg Hx   . Heart disease Neg Hx   . Diabetes Maternal Uncle   . Hypertension Father   . Stroke Brother   . Seizures Son     History   Social History  . Marital Status: Married    Spouse Name: N/A    Number of Children: N/A  . Years of Education: N/A   Occupational History  . Not on file.   Social History Main Topics  . Smoking status: Current Every Day Smoker -- 1.00 packs/day  . Smokeless tobacco: Never Used  . Alcohol Use: Not on file  . Drug Use: No  . Sexual Activity: Not Currently   Other Topics Concern  . Not on file   Social History Narrative  . No narrative on file     No current outpatient prescriptions on file prior to visit.   No current facility-administered medications on file prior to visit.    Allergies  Allergen Reactions  . Levofloxacin   . Sulfa Antibiotics   . Sulfonamide Derivatives     REACTION: Panic attacks    Review of Systems  Review of Systems  Constitutional: Negative for fever and malaise/fatigue.  HENT: Negative for congestion.   Eyes: Negative for discharge.  Respiratory: Negative for shortness of breath.   Cardiovascular: Negative for chest pain, palpitations and leg swelling.  Gastrointestinal: Negative for nausea, abdominal pain and diarrhea.  Genitourinary: Negative for dysuria.  Musculoskeletal: Positive for back pain. Negative for falls.  Skin: Negative for rash.  Neurological: Negative for loss of consciousness and headaches.  Endo/Heme/Allergies: Negative for polydipsia.  Psychiatric/Behavioral: Negative for depression and suicidal ideas. The patient is not nervous/anxious and does not have insomnia.     Objective  BP 120/84  Pulse 70  Temp(Src) 97.5 F (36.4 C) (Oral)  Ht 5\' 2"  (1.575 m)  Wt 175 lb (79.379 kg)  BMI 32 kg/m2  SpO2 98%  LMP 03/27/2013  Physical Exam  Physical Exam  Constitutional: She is oriented to person, place, and time and well-developed, well-nourished, and in no distress. No distress.  HENT:  Head: Normocephalic and atraumatic.  Right Ear: External ear normal.  Left Ear: External ear normal.  Nose: Nose normal.  Mouth/Throat: Oropharynx is clear and moist. No oropharyngeal exudate.  Eyes: Conjunctivae are normal. Pupils are equal, round, and reactive to light. Right eye exhibits no discharge. Left eye exhibits no discharge. No scleral icterus.  Neck: Normal range of motion. Neck supple. No thyromegaly present.  Cardiovascular: Normal rate, regular rhythm, normal heart sounds and intact distal pulses.   No murmur heard. Pulmonary/Chest: Effort normal and breath sounds  normal. No respiratory distress. She has no wheezes. She has no rales.  Abdominal: Soft. Bowel sounds are normal. She exhibits no distension and no mass. There is no tenderness.  Musculoskeletal: Normal range of motion. She exhibits no edema and no tenderness.  Lymphadenopathy:    She has no cervical adenopathy.  Neurological: She is alert and oriented to person, place, and time. She has normal reflexes. No cranial nerve deficit. Coordination normal.  Skin: Skin is warm and dry. No rash noted. She is not diaphoretic.  Psychiatric: Mood, memory and affect normal.       Assessment & Plan  DIABETES MELLITUS, TYPE II, UNCONTROLLED Acceptable hgba1c, avoid simple carbs. Encouraged DASH diet.   Elevated blood pressure Well controlled, no changes.   TOBACCO USER Smokes daily and declines any attempt to quit.   HYPERLIPIDEMIA Declines medications, encouraged krill oil and consider Red Yeast Rice and Co Q 10. Avoid trans fats, increase exercise, start DASH diet.  Preventative health care Declines Pap smear, MGM and colonoscopy. Warned regarding risk of cancer, stroke death that is possible without screening and recommended meds.  Low back pain No heavy lifting. Try Salon Pas patches.

## 2013-04-29 NOTE — Assessment & Plan Note (Signed)
Declines medications, encouraged krill oil and consider Red Yeast Rice and Co Q 10. Avoid trans fats, increase exercise, start DASH diet.

## 2013-04-29 NOTE — Assessment & Plan Note (Signed)
Well controlled, no changes 

## 2013-04-29 NOTE — Assessment & Plan Note (Signed)
Declines Pap smear, MGM and colonoscopy. Warned regarding risk of cancer, stroke death that is possible without screening and recommended meds.

## 2013-04-29 NOTE — Assessment & Plan Note (Signed)
Smokes daily and declines any attempt to quit.

## 2013-04-29 NOTE — Assessment & Plan Note (Signed)
No heavy lifting. Try Salon Pas patches.

## 2013-04-30 ENCOUNTER — Other Ambulatory Visit: Payer: Self-pay | Admitting: Family Medicine

## 2013-05-02 NOTE — Progress Notes (Signed)
Quick Note:  Patient Informed and voiced understanding ______ 

## 2013-07-15 ENCOUNTER — Other Ambulatory Visit: Payer: Self-pay | Admitting: Family Medicine

## 2013-07-31 ENCOUNTER — Ambulatory Visit: Payer: BC Managed Care – PPO | Admitting: Family Medicine

## 2013-08-17 ENCOUNTER — Telehealth: Payer: Self-pay

## 2013-08-17 DIAGNOSIS — Z0000 Encounter for general adult medical examination without abnormal findings: Secondary | ICD-10-CM

## 2013-08-17 DIAGNOSIS — IMO0001 Reserved for inherently not codable concepts without codable children: Secondary | ICD-10-CM

## 2013-08-17 DIAGNOSIS — E785 Hyperlipidemia, unspecified: Secondary | ICD-10-CM

## 2013-08-17 LAB — CBC
HCT: 40.3 % (ref 36.0–46.0)
MCH: 28.8 pg (ref 26.0–34.0)
MCV: 85.4 fL (ref 78.0–100.0)
Platelets: 343 10*3/uL (ref 150–400)
RBC: 4.72 MIL/uL (ref 3.87–5.11)
RDW: 13.9 % (ref 11.5–15.5)

## 2013-08-17 LAB — HEMOGLOBIN A1C: Hgb A1c MFr Bld: 7 % — ABNORMAL HIGH (ref <5.7)

## 2013-08-17 LAB — RENAL FUNCTION PANEL
BUN: 13 mg/dL (ref 6–23)
CO2: 26 mEq/L (ref 19–32)
Chloride: 104 mEq/L (ref 96–112)
Phosphorus: 3.8 mg/dL (ref 2.3–4.6)
Potassium: 4.6 mEq/L (ref 3.5–5.3)

## 2013-08-17 LAB — LIPID PANEL
Cholesterol: 210 mg/dL — ABNORMAL HIGH (ref 0–200)
HDL: 70 mg/dL (ref >39)
Total CHOL/HDL Ratio: 3 Ratio
VLDL: 31 mg/dL (ref 0–40)

## 2013-08-17 LAB — TSH: TSH: 1.128 u[IU]/mL (ref 0.350–4.500)

## 2013-08-17 LAB — HEPATIC FUNCTION PANEL
ALT: 16 U/L (ref 0–35)
AST: 17 U/L (ref 0–37)
Albumin: 3.9 g/dL (ref 3.5–5.2)
Total Bilirubin: 0.4 mg/dL (ref 0.3–1.2)

## 2013-08-17 NOTE — Telephone Encounter (Signed)
Lab order placed.

## 2013-08-21 ENCOUNTER — Ambulatory Visit (INDEPENDENT_AMBULATORY_CARE_PROVIDER_SITE_OTHER): Payer: BC Managed Care – PPO | Admitting: Family Medicine

## 2013-08-21 ENCOUNTER — Encounter: Payer: Self-pay | Admitting: Family Medicine

## 2013-08-21 VITALS — BP 120/82 | HR 66 | Temp 98.2°F | Ht 62.0 in | Wt 185.0 lb

## 2013-08-21 DIAGNOSIS — F341 Dysthymic disorder: Secondary | ICD-10-CM

## 2013-08-21 DIAGNOSIS — Z0000 Encounter for general adult medical examination without abnormal findings: Secondary | ICD-10-CM

## 2013-08-21 DIAGNOSIS — IMO0001 Reserved for inherently not codable concepts without codable children: Secondary | ICD-10-CM

## 2013-08-21 DIAGNOSIS — F411 Generalized anxiety disorder: Secondary | ICD-10-CM

## 2013-08-21 DIAGNOSIS — F329 Major depressive disorder, single episode, unspecified: Secondary | ICD-10-CM

## 2013-08-21 DIAGNOSIS — F32A Depression, unspecified: Secondary | ICD-10-CM

## 2013-08-21 MED ORDER — ALPRAZOLAM 0.5 MG PO TABS: 0.5000 mg | ORAL_TABLET | Freq: Three times a day (TID) | ORAL | Status: DC | PRN

## 2013-08-21 MED ORDER — SERTRALINE HCL 100 MG PO TABS: 100.0000 mg | ORAL_TABLET | Freq: Every day | ORAL | Status: DC

## 2013-08-21 NOTE — Patient Instructions (Signed)

## 2013-08-21 NOTE — Progress Notes (Signed)
Pre visit review using our clinic review tool, if applicable. No additional management support is needed unless otherwise documented below in the visit note. 

## 2013-08-27 ENCOUNTER — Encounter: Payer: Self-pay | Admitting: Family Medicine

## 2013-08-27 NOTE — Assessment & Plan Note (Signed)
Declines flu shot and pneumonia shot today

## 2013-08-27 NOTE — Assessment & Plan Note (Signed)
Encouraged to consider meds and declines, is going to try diet and exercise again

## 2013-08-27 NOTE — Progress Notes (Signed)
Patient ID: Ashley Jackson, female   DOB: 1960/12/22, 53 y.o.   MRN: [id number] Ashley SiaMary K Jackson [id number] 1960/12/22 08/27/2013      Progress Note-Follow Up  Subjective  Chief Complaint  Chief Complaint  Patient presents with  . Follow-up    3 month    HPI  Patient is a 53 year old female in today for followup. She's been under great stress lately since losing her job. Acknowledges she's not been eating well or exercising. No chest pain, palpitations, shortness of breath, GI or GU complaints.  Past Medical History  Diagnosis Date  . DIABETES MELLITUS, TYPE II, UNCONTROLLED 02/25/2010  . HYPERLIPIDEMIA 02/07/2008  . EXOGENOUS OBESITY 11/07/2007  . Overweight(278.02) 06/24/2010  . Anxiety state, unspecified 07/12/2007  . TOBACCO USER 06/24/2010  . DEPRESSION 07/12/2007  . HYPERGLYCEMIA 07/12/2007  . COLONIC POLYPS, HX OF 05/02/2007  . Tobacco abuse 10/24/2012  . Preventative health care 04/29/2013  . Low back pain 04/29/2013    Past Surgical History  Procedure Laterality Date  . Colonoscopy  2006    Family History  Problem Relation Age of Onset  . Liver cancer Mother   . Hypertension Other   . Breast cancer Neg Hx   . Prostate cancer Neg Hx   . Heart disease Neg Hx   . Diabetes Maternal Uncle   . Hypertension Father   . Stroke Brother   . Seizures Son     History   Social History  . Marital Status: Married    Spouse Name: N/A    Number of Children: N/A  . Years of Education: N/A   Occupational History  . Not on file.   Social History Main Topics  . Smoking status: Current Every Day Smoker -- 1.00 packs/day  . Smokeless tobacco: Never Used  . Alcohol Use: Not on file  . Drug Use: No  . Sexual Activity: Not Currently   Other Topics Concern  . Not on file   Social History Narrative  . No narrative on file    Current Outpatient Prescriptions on File Prior to Visit  Medication Sig Dispense Refill  . sertraline (ZOLOFT) 100 MG tablet TAKE 1 TABLET BY MOUTH DAILY.  30  tablet  0   No current facility-administered medications on file prior to visit.    Allergies  Allergen Reactions  . Levofloxacin   . Sulfa Antibiotics   . Sulfonamide Derivatives     REACTION: Panic attacks    Review of Systems  Review of Systems  Constitutional: Negative for fever and malaise/fatigue.  HENT: Negative for congestion.   Eyes: Negative for discharge.  Respiratory: Negative for shortness of breath.   Cardiovascular: Negative for chest pain, palpitations and leg swelling.  Gastrointestinal: Negative for nausea, abdominal pain and diarrhea.  Genitourinary: Negative for dysuria.  Musculoskeletal: Negative for falls.  Skin: Negative for rash.  Neurological: Negative for loss of consciousness and headaches.  Endo/Heme/Allergies: Negative for polydipsia.  Psychiatric/Behavioral: Positive for depression. Negative for suicidal ideas. The patient is nervous/anxious. The patient does not have insomnia.     Objective  BP 120/82  Pulse 66  Temp(Src) 98.2 F (36.8 C) (Oral)  Ht 5\' 2"  (1.575 m)  Wt 185 lb (83.915 kg)  BMI 33.83 kg/m2  SpO2 98%  LMP 07/21/2013  Physical Exam  Physical Exam  Constitutional: She is oriented to person, place, and time and well-developed, well-nourished, and in no distress. No distress.  HENT:  Head: Normocephalic and atraumatic.  Eyes: Conjunctivae are normal.  Neck: Neck supple. No thyromegaly present.  Cardiovascular: Normal rate, regular rhythm and normal heart sounds.   No murmur heard. Pulmonary/Chest: Effort normal and breath sounds normal. She has no wheezes.  Abdominal: She exhibits no distension and no mass.  Musculoskeletal: She exhibits no edema.  Lymphadenopathy:    She has no cervical adenopathy.  Neurological: She is alert and oriented to person, place, and time.  Skin: Skin is warm and dry. No rash noted. She is not diaphoretic.  Psychiatric: Memory, affect and judgment normal.    Lab Results  Component Value  Date   TSH 1.128 08/17/2013   Lab Results  Component Value Date   WBC 7.5 08/17/2013   HGB 13.6 08/17/2013   HCT 40.3 08/17/2013   MCV 85.4 08/17/2013   PLT 343 08/17/2013   Lab Results  Component Value Date   CREATININE 0.84 08/17/2013   BUN 13 08/17/2013   NA 139 08/17/2013   K 4.6 08/17/2013   CL 104 08/17/2013   CO2 26 08/17/2013   Lab Results  Component Value Date   ALT 16 08/17/2013   AST 17 08/17/2013   ALKPHOS 48 08/17/2013   BILITOT 0.4 08/17/2013   Lab Results  Component Value Date   CHOL 210* 08/17/2013   Lab Results  Component Value Date   HDL 70 08/17/2013   Lab Results  Component Value Date   LDLCALC 109* 08/17/2013   Lab Results  Component Value Date   TRIG 153* 08/17/2013   Lab Results  Component Value Date   CHOLHDL 3.0 08/17/2013     Assessment & Plan  Preventative health care Declines flu shot and pneumonia shot today  DIABETES MELLITUS, TYPE II, UNCONTROLLED Encouraged to consider meds and declines, is going to try diet and exercise again  DEPRESSION Given refills on sertraline and alprazolam

## 2013-08-27 NOTE — Assessment & Plan Note (Signed)
Given refills on sertraline and alprazolam

## 2014-01-30 ENCOUNTER — Encounter: Payer: Self-pay | Admitting: Family Medicine

## 2014-01-30 ENCOUNTER — Ambulatory Visit (INDEPENDENT_AMBULATORY_CARE_PROVIDER_SITE_OTHER): Payer: 59 | Admitting: Family Medicine

## 2014-01-30 ENCOUNTER — Telehealth: Payer: Self-pay | Admitting: Family Medicine

## 2014-01-30 VITALS — BP 110/88 | HR 72 | Temp 98.4°F | Ht 62.0 in | Wt 180.0 lb

## 2014-01-30 DIAGNOSIS — F329 Major depressive disorder, single episode, unspecified: Secondary | ICD-10-CM

## 2014-01-30 DIAGNOSIS — F341 Dysthymic disorder: Secondary | ICD-10-CM

## 2014-01-30 DIAGNOSIS — F3289 Other specified depressive episodes: Secondary | ICD-10-CM

## 2014-01-30 DIAGNOSIS — E119 Type 2 diabetes mellitus without complications: Secondary | ICD-10-CM

## 2014-01-30 DIAGNOSIS — F419 Anxiety disorder, unspecified: Principal | ICD-10-CM

## 2014-01-30 DIAGNOSIS — IMO0001 Reserved for inherently not codable concepts without codable children: Secondary | ICD-10-CM

## 2014-01-30 DIAGNOSIS — E1165 Type 2 diabetes mellitus with hyperglycemia: Secondary | ICD-10-CM

## 2014-01-30 DIAGNOSIS — F32A Depression, unspecified: Secondary | ICD-10-CM

## 2014-01-30 LAB — HEMOGLOBIN A1C
HEMOGLOBIN A1C: 7.3 % — AB (ref <5.7)
MEAN PLASMA GLUCOSE: 163 mg/dL — AB (ref <117)

## 2014-01-30 MED ORDER — ALPRAZOLAM 0.5 MG PO TABS: 0.5000 mg | ORAL_TABLET | Freq: Three times a day (TID) | ORAL | Status: DC | PRN

## 2014-01-30 NOTE — Progress Notes (Signed)
Patient ID: Ashley Jackson, female   DOB: [birth year], 53 y.o.   MRN: [id number] Ashley Jackson [id number] June 02, 1961 01/30/2014      Progress Note-Follow Up  Subjective  Chief Complaint  Chief Complaint  Patient presents with  . Follow-up    6 month    HPI  Patient is a 53 year old female in today for routine medical care. She is feeling fairly well. She technologist he can walk her carbohydrate intake. Her anxiety depression has been manageable and her SSRI and alprazolam roughly 1-2 tablets daily. She denies any suicidal ideation or anhedonia. Denies CP/palp/SOB/HA/congestion/fevers/GI or GU c/o. Taking meds as prescribed  Past Medical History  Diagnosis Date  . DIABETES MELLITUS, TYPE II, UNCONTROLLED 02/25/2010  . HYPERLIPIDEMIA 02/07/2008  . EXOGENOUS OBESITY 11/07/2007  . Overweight 06/24/2010  . Anxiety state, unspecified 07/12/2007  . TOBACCO USER 06/24/2010  . DEPRESSION 07/12/2007  . HYPERGLYCEMIA 07/12/2007  . COLONIC POLYPS, HX OF 05/02/2007  . Tobacco abuse 10/24/2012  . Preventative health care 04/29/2013  . Low back pain 04/29/2013    Past Surgical History  Procedure Laterality Date  . Colonoscopy  2006    Family History  Problem Relation Age of Onset  . Liver cancer Mother   . Hypertension Other   . Breast cancer Neg Hx   . Prostate cancer Neg Hx   . Heart disease Neg Hx   . Diabetes Maternal Uncle   . Hypertension Father   . Stroke Brother   . Seizures Son     History   Social History  . Marital Status: Married    Spouse Name: N/A    Number of Children: N/A  . Years of Education: N/A   Occupational History  . Not on file.   Social History Main Topics  . Smoking status: Current Every Day Smoker -- 1.00 packs/day  . Smokeless tobacco: Never Used  . Alcohol Use: Not on file  . Drug Use: No  . Sexual Activity: Not Currently   Other Topics Concern  . Not on file   Social History Narrative  . No narrative on file    Current Outpatient Prescriptions  on File Prior to Visit  Medication Sig Dispense Refill  . ALPRAZolam (XANAX) 0.5 MG tablet Take 1 tablet (0.5 mg total) by mouth 3 (three) times daily as needed for anxiety or sleep.  90 tablet  5  . sertraline (ZOLOFT) 100 MG tablet Take 1 tablet (100 mg total) by mouth daily.  90 tablet  3   No current facility-administered medications on file prior to visit.    Allergies  Allergen Reactions  . Levofloxacin   . Sulfa Antibiotics   . Sulfonamide Derivatives     REACTION: Panic attacks    Review of Systems  Review of Systems  Constitutional: Negative for fever and malaise/fatigue.  HENT: Negative for congestion.   Eyes: Negative for discharge.  Respiratory: Negative for shortness of breath.   Cardiovascular: Negative for chest pain, palpitations and leg swelling.  Gastrointestinal: Negative for nausea, abdominal pain and diarrhea.  Genitourinary: Negative for dysuria.  Musculoskeletal: Negative for falls.  Skin: Negative for rash.  Neurological: Negative for loss of consciousness and headaches.  Endo/Heme/Allergies: Negative for polydipsia.  Psychiatric/Behavioral: Negative for depression and suicidal ideas. The patient is not nervous/anxious and does not have insomnia.     Objective  BP 110/88  Pulse 72  Temp(Src) 98.4 F (36.9 C) (Oral)  Ht 5\' 2"  (1.575 m)  Wt 180  lb (81.647 kg)  BMI 32.91 kg/m2  SpO2 97%  LMP 01/15/2014  Physical Exam  Physical Exam  Constitutional: She is oriented to person, place, and time and well-developed, well-nourished, and in no distress. No distress.  HENT:  Head: Normocephalic and atraumatic.  Eyes: Conjunctivae are normal.  Neck: Neck supple. No thyromegaly present.  Cardiovascular: Normal rate, regular rhythm and normal heart sounds.   No murmur heard. Pulmonary/Chest: Effort normal and breath sounds normal. She has no wheezes.  Abdominal: She exhibits no distension and no mass.  Musculoskeletal: She exhibits no edema.   Lymphadenopathy:    She has no cervical adenopathy.  Neurological: She is alert and oriented to person, place, and time.  Skin: Skin is warm and dry. No rash noted. She is not diaphoretic.  Psychiatric: Memory, affect and judgment normal.    Lab Results  Component Value Date   TSH 1.128 08/17/2013   Lab Results  Component Value Date   WBC 7.5 08/17/2013   HGB 13.6 08/17/2013   HCT 40.3 08/17/2013   MCV 85.4 08/17/2013   PLT 343 08/17/2013   Lab Results  Component Value Date   CREATININE 0.84 08/17/2013   BUN 13 08/17/2013   NA 139 08/17/2013   K 4.6 08/17/2013   CL 104 08/17/2013   CO2 26 08/17/2013   Lab Results  Component Value Date   ALT 16 08/17/2013   AST 17 08/17/2013   ALKPHOS 48 08/17/2013   BILITOT 0.4 08/17/2013   Lab Results  Component Value Date   CHOL 210* 08/17/2013   Lab Results  Component Value Date   HDL 70 08/17/2013   Lab Results  Component Value Date   LDLCALC 109* 08/17/2013   Lab Results  Component Value Date   TRIG 153* 08/17/2013   Lab Results  Component Value Date   CHOLHDL 3.0 08/17/2013     Assessment & Plan  DIABETES MELLITUS, TYPE II, UNCONTROLLED A1C up to 7.3 minimize simple carbs and increase exercise.  DEPRESSION Allowed refill on Alprazolam and continue Sertraline.

## 2014-01-30 NOTE — Patient Instructions (Signed)
Lipid, hgba1c prior to next visit  Diabetes and Exercise Exercising regularly is important. It is not just about losing weight. It has many health benefits, such as:  Improving your overall fitness, flexibility, and endurance.  Increasing your bone density.  Helping with weight control.  Decreasing your body fat.  Increasing your muscle strength.  Reducing stress and tension.  Improving your overall health. People with diabetes who exercise gain additional benefits because exercise:  Reduces appetite.  Improves the body's use of blood sugar (glucose).  Helps lower or control blood glucose.  Decreases blood pressure.  Helps control blood lipids (such as cholesterol and triglycerides).  Improves the body's use of the hormone insulin by:  Increasing the body's insulin sensitivity.  Reducing the body's insulin needs.  Decreases the risk for heart disease because exercising:  Lowers cholesterol and triglycerides levels.  Increases the levels of good cholesterol (such as high-density lipoproteins [HDL]) in the body.  Lowers blood glucose levels. YOUR ACTIVITY PLAN  Choose an activity that you enjoy and set realistic goals. Your health care provider or diabetes educator can help you make an activity plan that works for you. You can break activities into 2 or 3 sessions throughout the day. Doing so is as good as one long session. Exercise ideas include:  Taking the dog for a walk.  Taking the stairs instead of the elevator.  Dancing to your favorite song.  Doing your favorite exercise with a friend. RECOMMENDATIONS FOR EXERCISING WITH TYPE 1 OR TYPE 2 DIABETES   Check your blood glucose before exercising. If blood glucose levels are greater than 240 mg/dL, check for urine ketones. Do not exercise if ketones are present.  Avoid injecting insulin into areas of the body that are going to be exercised. For example, avoid injecting insulin into:  The arms when playing  tennis.  The legs when jogging.  Keep a record of:  Food intake before and after you exercise.  Expected peak times of insulin action.  Blood glucose levels before and after you exercise.  The type and amount of exercise you have done.  Review your records with your health care provider. Your health care provider will help you to develop guidelines for adjusting food intake and insulin amounts before and after exercising.  If you take insulin or oral hypoglycemic agents, watch for signs and symptoms of hypoglycemia. They include:  Dizziness.  Shaking.  Sweating.  Chills.  Confusion.  Drink plenty of water while you exercise to prevent dehydration or heat stroke. Body water is lost during exercise and must be replaced.  Talk to your health care provider before starting an exercise program to make sure it is safe for you. Remember, almost any type of activity is better than none. Document Released: 10/30/2003 Document Revised: 04/11/2013 Document Reviewed: 01/16/2013 [facility] Patient Information 2014 [place], [place].

## 2014-01-30 NOTE — Telephone Encounter (Signed)
Relevant patient education mailed to patient.  

## 2014-01-30 NOTE — Progress Notes (Signed)
Pre visit review using our clinic review tool, if applicable. No additional management support is needed unless otherwise documented below in the visit note. 

## 2014-02-04 ENCOUNTER — Telehealth: Payer: Self-pay | Admitting: Family Medicine

## 2014-02-04 NOTE — Telephone Encounter (Signed)
Patient left message returning Ashley Jackson call, she started a new job and she will not be available to speak with someone until Wednesday. Patient request we leave a message with her A1C results on voicemail

## 2014-02-04 NOTE — Telephone Encounter (Signed)
Wrote pt's message on her result note.

## 2014-02-05 NOTE — Progress Notes (Signed)
Left a detailed message on vm and stated to call us back if pt would like to start the metformin

## 2014-02-06 NOTE — Assessment & Plan Note (Signed)
A1C up to 7.3 minimize simple carbs and increase exercise.

## 2014-02-06 NOTE — Assessment & Plan Note (Signed)
Allowed refill on Alprazolam and continue Sertraline.

## 2014-02-18 ENCOUNTER — Ambulatory Visit: Payer: Self-pay | Admitting: Family Medicine

## 2014-03-07 ENCOUNTER — Telehealth: Payer: Self-pay

## 2014-03-07 NOTE — Telephone Encounter (Signed)
Diabetic Bundle:   Pt informed that we would like her to come in and have her LDL rechecked and pt states she will wait until her next appt

## 2014-08-06 ENCOUNTER — Ambulatory Visit (INDEPENDENT_AMBULATORY_CARE_PROVIDER_SITE_OTHER): Payer: Self-pay | Admitting: Family Medicine

## 2014-08-06 ENCOUNTER — Encounter: Payer: Self-pay | Admitting: Family Medicine

## 2014-08-06 VITALS — BP 124/84 | HR 74 | Temp 98.3°F | Ht 62.75 in | Wt 178.4 lb

## 2014-08-06 DIAGNOSIS — E119 Type 2 diabetes mellitus without complications: Secondary | ICD-10-CM

## 2014-08-06 DIAGNOSIS — E1169 Type 2 diabetes mellitus with other specified complication: Secondary | ICD-10-CM

## 2014-08-06 DIAGNOSIS — F17200 Nicotine dependence, unspecified, uncomplicated: Secondary | ICD-10-CM

## 2014-08-06 DIAGNOSIS — Z720 Tobacco use: Secondary | ICD-10-CM

## 2014-08-06 DIAGNOSIS — E782 Mixed hyperlipidemia: Secondary | ICD-10-CM

## 2014-08-06 DIAGNOSIS — F418 Other specified anxiety disorders: Secondary | ICD-10-CM

## 2014-08-06 DIAGNOSIS — R030 Elevated blood-pressure reading, without diagnosis of hypertension: Secondary | ICD-10-CM

## 2014-08-06 DIAGNOSIS — F419 Anxiety disorder, unspecified: Secondary | ICD-10-CM

## 2014-08-06 DIAGNOSIS — F329 Major depressive disorder, single episode, unspecified: Secondary | ICD-10-CM

## 2014-08-06 DIAGNOSIS — F32A Depression, unspecified: Secondary | ICD-10-CM

## 2014-08-06 DIAGNOSIS — IMO0001 Reserved for inherently not codable concepts without codable children: Secondary | ICD-10-CM

## 2014-08-06 DIAGNOSIS — E669 Obesity, unspecified: Secondary | ICD-10-CM

## 2014-08-06 DIAGNOSIS — E785 Hyperlipidemia, unspecified: Secondary | ICD-10-CM

## 2014-08-06 DIAGNOSIS — F411 Generalized anxiety disorder: Secondary | ICD-10-CM

## 2014-08-06 DIAGNOSIS — Z0000 Encounter for general adult medical examination without abnormal findings: Secondary | ICD-10-CM

## 2014-08-06 LAB — COMPREHENSIVE METABOLIC PANEL
ALT: 23 U/L (ref 0–35)
AST: 23 U/L (ref 0–37)
Albumin: 4 g/dL (ref 3.5–5.2)
Alkaline Phosphatase: 51 U/L (ref 39–117)
BUN: 14 mg/dL (ref 6–23)
CO2: 23 mEq/L (ref 19–32)
Calcium: 9.1 mg/dL (ref 8.4–10.5)
Chloride: 108 mEq/L (ref 96–112)
Creatinine, Ser: 0.8 mg/dL (ref 0.4–1.2)
GFR: 79.68 mL/min (ref >60.00)
Glucose, Bld: 143 mg/dL — ABNORMAL HIGH (ref 70–99)
POTASSIUM: 4 meq/L (ref 3.5–5.1)
Sodium: 136 mEq/L (ref 135–145)
Total Bilirubin: 0.3 mg/dL (ref 0.2–1.2)
Total Protein: 7.4 g/dL (ref 6.0–8.3)

## 2014-08-06 LAB — LIPID PANEL
CHOL/HDL RATIO: 4
Cholesterol: 206 mg/dL — ABNORMAL HIGH (ref 0–200)
HDL: 51.6 mg/dL (ref >39.00)
LDL Cholesterol: 124 mg/dL — ABNORMAL HIGH (ref 0–99)
NonHDL: 154.4
Triglycerides: 153 mg/dL — ABNORMAL HIGH (ref 0.0–149.0)
VLDL: 30.6 mg/dL (ref 0.0–40.0)

## 2014-08-06 LAB — HEMOGLOBIN A1C: Hgb A1c MFr Bld: 7.9 % — ABNORMAL HIGH (ref 4.6–6.5)

## 2014-08-06 MED ORDER — ALPRAZOLAM 0.5 MG PO TABS: 0.5000 mg | ORAL_TABLET | Freq: Three times a day (TID) | ORAL | Status: DC | PRN

## 2014-08-06 MED ORDER — AMOXICILLIN 500 MG PO CAPS: 500.0000 mg | ORAL_CAPSULE | Freq: Three times a day (TID) | ORAL | Status: DC

## 2014-08-06 MED ORDER — SERTRALINE HCL 100 MG PO TABS: 100.0000 mg | ORAL_TABLET | Freq: Every day | ORAL | Status: DC

## 2014-08-06 NOTE — Progress Notes (Signed)
Ashley Jackson  [id number] 1961-04-29 08/06/2014      Progress Note-Follow Up  Subjective  Chief Complaint  Chief Complaint  Patient presents with  . Diabetes    6 mos follow-up    HPI  Patient is a 53 y.o. female in today for routine medical care. Patient struggling with cough this week. Usually nonproductive. No fevers. With her recent acute illness. Unfortunately continues to smoke. Denies polyuria or polydipsia. Denies CP/palp/SOB/HA/congestion/fevers/GI or GU c/o. Taking meds as prescribed  Past Medical History  Diagnosis Date  . DIABETES MELLITUS, TYPE II, UNCONTROLLED 02/25/2010  . HYPERLIPIDEMIA 02/07/2008  . EXOGENOUS OBESITY 11/07/2007  . Overweight(278.02) 06/24/2010  . Anxiety state, unspecified 07/12/2007  . TOBACCO USER 06/24/2010  . DEPRESSION 07/12/2007  . HYPERGLYCEMIA 07/12/2007  . COLONIC POLYPS, HX OF 05/02/2007  . Tobacco abuse 10/24/2012  . Preventative health care 04/29/2013  . Low back pain 04/29/2013    Past Surgical History  Procedure Laterality Date  . Colonoscopy  2006    Family History  Problem Relation Age of Onset  . Liver cancer Mother   . Hypertension Other   . Breast cancer Neg Hx   . Prostate cancer Neg Hx   . Heart disease Neg Hx   . Diabetes Maternal Uncle   . Hypertension Father   . Stroke Brother   . Seizures Son     History   Social History  . Marital Status: Married    Spouse Name: N/A    Number of Children: N/A  . Years of Education: N/A   Occupational History  . Not on file.   Social History Main Topics  . Smoking status: Current Every Day Smoker -- 1.00 packs/day  . Smokeless tobacco: Never Used  . Alcohol Use: Not on file  . Drug Use: No  . Sexual Activity: Not Currently   Other Topics Concern  . Not on file   Social History Narrative    No current outpatient prescriptions on file prior to visit.   No current facility-administered medications on file prior to visit.    Allergies  Allergen Reactions  .  Levofloxacin   . Sulfa Antibiotics   . Sulfonamide Derivatives     REACTION: Panic attacks    Review of Systems  Review of Systems  Constitutional: Negative for fever and malaise/fatigue.  HENT: Positive for congestion and sore throat.   Eyes: Negative for discharge.  Respiratory: Positive for cough, sputum production and wheezing. Negative for shortness of breath.   Cardiovascular: Negative for chest pain, palpitations and leg swelling.  Gastrointestinal: Negative for nausea, abdominal pain and diarrhea.  Genitourinary: Negative for dysuria.  Musculoskeletal: Negative for falls.  Skin: Negative for rash.  Neurological: Negative for loss of consciousness and headaches.  Endo/Heme/Allergies: Negative for polydipsia.  Psychiatric/Behavioral: Negative for depression and suicidal ideas. The patient is not nervous/anxious and does not have insomnia.     Objective  BP 124/84 mmHg  Pulse 74  Temp(Src) 98.3 F (36.8 C) (Oral)  Ht 5' 2.75" (1.594 m)  Wt 178 lb 6.4 oz (80.922 kg)  BMI 31.85 kg/m2  SpO2 96%  LMP 07/26/2014  Physical Exam  Physical Exam  Constitutional: She is oriented to person, place, and time and well-developed, well-nourished, and in no distress. No distress.  HENT:  Head: Normocephalic and atraumatic.  Eyes: Conjunctivae are normal.  Neck: Neck supple. No thyromegaly present.  Cardiovascular: Normal rate, regular rhythm and normal heart sounds.   No murmur heard. Pulmonary/Chest: Effort  normal. She has wheezes.  B/l bases  Abdominal: She exhibits no distension and no mass.  Musculoskeletal: She exhibits no edema.  Lymphadenopathy:    She has no cervical adenopathy.  Neurological: She is alert and oriented to person, place, and time.  Skin: Skin is warm and dry. No rash noted. She is not diaphoretic.  Psychiatric: Memory, affect and judgment normal.    Lab Results  Component Value Date   TSH 1.128 08/17/2013   Lab Results  Component Value Date    WBC 7.5 08/17/2013   HGB 13.6 08/17/2013   HCT 40.3 08/17/2013   MCV 85.4 08/17/2013   PLT 343 08/17/2013   Lab Results  Component Value Date   CREATININE 0.84 08/17/2013   BUN 13 08/17/2013   NA 139 08/17/2013   K 4.6 08/17/2013   CL 104 08/17/2013   CO2 26 08/17/2013   Lab Results  Component Value Date   ALT 16 08/17/2013   AST 17 08/17/2013   ALKPHOS 48 08/17/2013   BILITOT 0.4 08/17/2013   Lab Results  Component Value Date   CHOL 210* 08/17/2013   Lab Results  Component Value Date   HDL 70 08/17/2013   Lab Results  Component Value Date   LDLCALC 109* 08/17/2013   Lab Results  Component Value Date   TRIG 153* 08/17/2013   Lab Results  Component Value Date   CHOLHDL 3.0 08/17/2013     Assessment & Plan  Diabetes mellitus type 2 in obese hgba1c unacceptable, minimize simple carbs. Increase exercise as tolerated. Patient continues to declines meds. Encouraged to reconsider.  Depression with anxiety Given refills on Zoloft and Xanax, she finds these are helping  Hyperlipidemia, mixed Encouraged heart healthy diet, increase exercise, avoid trans fats, consider a krill oil cap daily. Declines meds   Elevated blood pressure Improved on recheck. Encouraged heart healthy diet such as the DASH diet and exercise as tolerated.   TOBACCO USER Encouraged complete cessation. Discussed need to quit as relates to risk of numerous cancers, cardiac and pulmonary disease as well as neurologic complications. Counseled for greater than 3 minutes  Preventative health care Patient encouraged to maintain heart healthy diet, regular exercise, adequate sleep. Consider daily probiotics. Take medications as prescribed

## 2014-08-06 NOTE — Patient Instructions (Signed)
Add Mucinex/Robitussin twice a day  Probiotic daily Elderberry liquid Hydrate, 64 oz of fluids daily   Acute Bronchitis Bronchitis is inflammation of the airways that extend from the windpipe into the lungs (bronchi). The inflammation often causes mucus to develop. This leads to a cough, which is the most common symptom of bronchitis.  In acute bronchitis, the condition usually develops suddenly and goes away over time, usually in a couple weeks. Smoking, allergies, and asthma can make bronchitis worse. Repeated episodes of bronchitis may cause further lung problems.  CAUSES Acute bronchitis is most often caused by the same virus that causes a cold. The virus can spread from person to person (contagious) through coughing, sneezing, and touching contaminated objects. SIGNS AND SYMPTOMS   Cough.   Fever.   Coughing up mucus.   Body aches.   Chest congestion.   Chills.   Shortness of breath.   Sore throat.  DIAGNOSIS  Acute bronchitis is usually diagnosed through a physical exam. Your health care provider will also ask you questions about your medical history. Tests, such as chest X-rays, are sometimes done to rule out other conditions.  TREATMENT  Acute bronchitis usually goes away in a couple weeks. Oftentimes, no medical treatment is necessary. Medicines are sometimes given for relief of fever or cough. Antibiotic medicines are usually not needed but may be prescribed in certain situations. In some cases, an inhaler may be recommended to help reduce shortness of breath and control the cough. A cool mist vaporizer may also be used to help thin bronchial secretions and make it easier to clear the chest.  HOME CARE INSTRUCTIONS  Get plenty of rest.   Drink enough fluids to keep your urine clear or pale yellow (unless you have a medical condition that requires fluid restriction). Increasing fluids may help thin your respiratory secretions (sputum) and reduce chest  congestion, and it will prevent dehydration.   Take medicines only as directed by your health care provider.  If you were prescribed an antibiotic medicine, finish it all even if you start to feel better.  Avoid smoking and secondhand smoke. Exposure to cigarette smoke or irritating chemicals will make bronchitis worse. If you are a smoker, consider using nicotine gum or skin patches to help control withdrawal symptoms. Quitting smoking will help your lungs heal faster.   Reduce the chances of another bout of acute bronchitis by washing your hands frequently, avoiding people with cold symptoms, and trying not to touch your hands to your mouth, nose, or eyes.   Keep all follow-up visits as directed by your health care provider.  SEEK MEDICAL CARE IF: Your symptoms do not improve after 1 week of treatment.  SEEK IMMEDIATE MEDICAL CARE IF:  You develop an increased fever or chills.   You have chest pain.   You have severe shortness of breath.  You have bloody sputum.   You develop dehydration.  You faint or repeatedly feel like you are going to pass out.  You develop repeated vomiting.  You develop a severe headache. MAKE SURE YOU:   Understand these instructions.  Will watch your condition.  Will get help right away if you are not doing well or get worse. Document Released: 09/16/2004 Document Revised: 12/24/2013 Document Reviewed: 01/30/2013 [facility] Patient Information 2015 [employer] [place], [place]. This information is not intended to replace advice given to you by your health care provider. Make sure you discuss any questions you have with your health care provider.

## 2014-08-06 NOTE — Progress Notes (Signed)
Pre visit review using our clinic review tool, if applicable. No additional management support is needed unless otherwise documented below in the visit note. 

## 2014-08-07 ENCOUNTER — Encounter: Payer: Self-pay | Admitting: Family Medicine

## 2014-08-08 ENCOUNTER — Encounter: Payer: Self-pay | Admitting: Family Medicine

## 2014-08-12 NOTE — Assessment & Plan Note (Signed)
hgba1c unacceptable, minimize simple carbs. Increase exercise as tolerated. Patient continues to declines meds. Encouraged to reconsider.

## 2014-08-12 NOTE — Assessment & Plan Note (Signed)
Encouraged heart healthy diet, increase exercise, avoid trans fats, consider a krill oil cap daily. Declines meds 

## 2014-08-12 NOTE — Assessment & Plan Note (Signed)
Encouraged complete cessation. Discussed need to quit as relates to risk of numerous cancers, cardiac and pulmonary disease as well as neurologic complications. Counseled for greater than 3 minutes 

## 2014-08-12 NOTE — Assessment & Plan Note (Signed)
Improved on recheck. Encouraged heart healthy diet such as the DASH diet and exercise as tolerated.  

## 2014-08-12 NOTE — Assessment & Plan Note (Signed)
Patient encouraged to maintain heart healthy diet, regular exercise, adequate sleep. Consider daily probiotics. Take medications as prescribed 

## 2014-08-12 NOTE — Telephone Encounter (Signed)
LMOM @ 2:29pm @ [phone]) informing the pt that letter has been written for her to return to work and she can stop by at anytime to pick it up.//AB/CMA

## 2014-08-12 NOTE — Assessment & Plan Note (Signed)
Given refills on Zoloft and Xanax, she finds these are helping

## 2014-10-14 ENCOUNTER — Encounter: Payer: Self-pay | Admitting: Family Medicine

## 2014-10-16 NOTE — Telephone Encounter (Signed)
Note was faxed to [facility]-Attn:Stephanie.  Pt aware.//AB/CMA

## 2014-11-14 ENCOUNTER — Telehealth: Payer: Self-pay | Admitting: Family Medicine

## 2014-11-14 ENCOUNTER — Ambulatory Visit (INDEPENDENT_AMBULATORY_CARE_PROVIDER_SITE_OTHER): Payer: Self-pay | Admitting: Family Medicine

## 2014-11-14 ENCOUNTER — Encounter: Payer: Self-pay | Admitting: Family Medicine

## 2014-11-14 VITALS — BP 124/82 | HR 78 | Temp 98.0°F | Resp 17 | Wt 167.5 lb

## 2014-11-14 DIAGNOSIS — E782 Mixed hyperlipidemia: Secondary | ICD-10-CM

## 2014-11-14 DIAGNOSIS — E669 Obesity, unspecified: Secondary | ICD-10-CM

## 2014-11-14 DIAGNOSIS — E119 Type 2 diabetes mellitus without complications: Secondary | ICD-10-CM

## 2014-11-14 DIAGNOSIS — E1169 Type 2 diabetes mellitus with other specified complication: Secondary | ICD-10-CM

## 2014-11-14 LAB — HEMOGLOBIN A1C: Hgb A1c MFr Bld: 6.9 % — ABNORMAL HIGH (ref 4.6–6.5)

## 2014-11-14 NOTE — Progress Notes (Signed)
Pre visit review using our clinic review tool, if applicable. No additional management support is needed unless otherwise documented below in the visit note. 

## 2014-11-14 NOTE — Patient Instructions (Signed)

## 2014-11-14 NOTE — Telephone Encounter (Signed)
emmi emailed °

## 2014-11-15 ENCOUNTER — Encounter: Payer: Self-pay | Admitting: Family Medicine

## 2014-11-24 ENCOUNTER — Encounter: Payer: Self-pay | Admitting: Family Medicine

## 2014-11-24 NOTE — Assessment & Plan Note (Signed)
Encouraged heart healthy diet, increase exercise, avoid trans fats, consider a krill oil cap daily. Patient declines medications but has made good adjustments to lifestyle and diet

## 2014-11-24 NOTE — Progress Notes (Signed)
Ashley Jackson  [id number] 1961/08/18 11/24/2014      Progress Note-Follow Up  Subjective  Chief Complaint  Chief Complaint  Patient presents with  . Follow-up    Diabetes    HPI  Patient is a 54 y.o. female in today for routine medical care. Patient is in today for follow-up on her diabetes. She has made some great changes to her diet and exercise and has been very pleased with the way she feels. Her energy is up. Unfortunately she continues to smoke and once again is counseled at length regarding the need to quit smoking. Patient not interested at this time. No other recent illness or acute concerns. Denies CP/palp/SOB/HA/congestion/fevers/GI or GU c/o. Taking meds as prescribed  Past Medical History  Diagnosis Date  . DIABETES MELLITUS, TYPE II, UNCONTROLLED 02/25/2010  . HYPERLIPIDEMIA 02/07/2008  . EXOGENOUS OBESITY 11/07/2007  . Overweight(278.02) 06/24/2010  . Anxiety state, unspecified 07/12/2007  . TOBACCO USER 06/24/2010  . DEPRESSION 07/12/2007  . HYPERGLYCEMIA 07/12/2007  . COLONIC POLYPS, HX OF 05/02/2007  . Tobacco abuse 10/24/2012  . Preventative health care 04/29/2013  . Low back pain 04/29/2013    Past Surgical History  Procedure Laterality Date  . Colonoscopy  2006    Family History  Problem Relation Age of Onset  . Liver cancer Mother   . Hypertension Other   . Breast cancer Neg Hx   . Prostate cancer Neg Hx   . Heart disease Neg Hx   . Diabetes Maternal Uncle   . Hypertension Father   . Stroke Brother   . Seizures Son     History   Social History  . Marital Status: Married    Spouse Name: N/A  . Number of Children: N/A  . Years of Education: N/A   Occupational History  . Not on file.   Social History Main Topics  . Smoking status: Current Every Day Smoker -- 1.00 packs/day  . Smokeless tobacco: Never Used  . Alcohol Use: Not on file  . Drug Use: No  . Sexual Activity: Not Currently   Other Topics Concern  . Not on file   Social History  Narrative    Current Outpatient Prescriptions on File Prior to Visit  Medication Sig Dispense Refill  . sertraline (ZOLOFT) 100 MG tablet Take 1 tablet (100 mg total) by mouth daily. 90 tablet 3   No current facility-administered medications on file prior to visit.    Allergies  Allergen Reactions  . Levofloxacin     anxiety  . Sulfa Antibiotics     Panic attacks  . Sulfonamide Derivatives     REACTION: Panic attacks    Review of Systems  Review of Systems  Constitutional: Negative for fever and malaise/fatigue.  HENT: Negative for congestion.   Eyes: Negative for discharge.  Respiratory: Negative for shortness of breath.   Cardiovascular: Negative for chest pain, palpitations and leg swelling.  Gastrointestinal: Negative for nausea, abdominal pain and diarrhea.  Genitourinary: Negative for dysuria.  Musculoskeletal: Negative for falls.  Skin: Negative for rash.  Neurological: Negative for loss of consciousness and headaches.  Endo/Heme/Allergies: Negative for polydipsia.  Psychiatric/Behavioral: Negative for depression and suicidal ideas. The patient is not nervous/anxious and does not have insomnia.     Objective  BP 124/82 mmHg  Pulse 78  Temp(Src) 98 F (36.7 C) (Oral)  Resp 17  Wt 167 lb 8 oz (75.978 kg)  SpO2 95%  Physical Exam  Physical Exam  Constitutional: She is  oriented to person, place, and time and well-developed, well-nourished, and in no distress. No distress.  HENT:  Head: Normocephalic and atraumatic.  Eyes: Conjunctivae are normal.  Neck: Neck supple. No thyromegaly present.  Cardiovascular: Normal rate, regular rhythm and normal heart sounds.   No murmur heard. Pulmonary/Chest: Effort normal and breath sounds normal. She has no wheezes.  Abdominal: She exhibits no distension and no mass.  Musculoskeletal: She exhibits no edema.  Lymphadenopathy:    She has no cervical adenopathy.  Neurological: She is alert and oriented to person,  place, and time.  Skin: Skin is warm and dry. No rash noted. She is not diaphoretic.  Psychiatric: Memory, affect and judgment normal.    Lab Results  Component Value Date   TSH 1.128 08/17/2013   Lab Results  Component Value Date   WBC 7.5 08/17/2013   HGB 13.6 08/17/2013   HCT 40.3 08/17/2013   MCV 85.4 08/17/2013   PLT 343 08/17/2013   Lab Results  Component Value Date   CREATININE 0.8 08/06/2014   BUN 14 08/06/2014   NA 136 08/06/2014   K 4.0 08/06/2014   CL 108 08/06/2014   CO2 23 08/06/2014   Lab Results  Component Value Date   ALT 23 08/06/2014   AST 23 08/06/2014   ALKPHOS 51 08/06/2014   BILITOT 0.3 08/06/2014   Lab Results  Component Value Date   CHOL 206* 08/06/2014   Lab Results  Component Value Date   HDL 51.60 08/06/2014   Lab Results  Component Value Date   LDLCALC 124* 08/06/2014   Lab Results  Component Value Date   TRIG 153.0* 08/06/2014   Lab Results  Component Value Date   CHOLHDL 4 08/06/2014     Assessment & Plan  Hyperlipidemia, mixed Encouraged heart healthy diet, increase exercise, avoid trans fats, consider a krill oil cap daily. Patient declines medications but has made good adjustments to lifestyle and diet   Diabetes mellitus type 2 in obese hgba1c acceptable, minimize simple carbs. Increase exercise as tolerated. Has made good adjustents to diet and exercise.

## 2014-11-24 NOTE — Assessment & Plan Note (Signed)
hgba1c acceptable, minimize simple carbs. Increase exercise as tolerated. Has made good adjustents to diet and exercise.

## 2015-02-07 ENCOUNTER — Telehealth: Payer: Self-pay | Admitting: General Practice

## 2015-02-07 NOTE — Telephone Encounter (Signed)
Called pt to find out if she has had a mammogram recently? If so where? And When?

## 2015-02-07 NOTE — Telephone Encounter (Signed)
Pt returned call. She has not had a mammogram recently and she states she does not want one.

## 2015-02-07 NOTE — Telephone Encounter (Signed)
Noted. Chart updated.

## 2015-02-17 ENCOUNTER — Other Ambulatory Visit: Payer: Self-pay | Admitting: Family Medicine

## 2015-02-17 MED ORDER — ALPRAZOLAM 0.5 MG PO TABS: 0.5000 mg | ORAL_TABLET | Freq: Three times a day (TID) | ORAL | Status: DC | PRN

## 2015-02-17 NOTE — Telephone Encounter (Signed)
Faxed hardcopy for Alprazolam to [facility].

## 2015-02-17 NOTE — Telephone Encounter (Signed)
Requesting:  Alprazolam Contract  None UDS   None Last OV   11/14/14 Last Refill    #90 with 5 refills on 08/06/14  Please Advise

## 2015-03-20 ENCOUNTER — Other Ambulatory Visit: Payer: Self-pay | Admitting: Family Medicine

## 2015-03-20 MED ORDER — ALPRAZOLAM 0.5 MG PO TABS: 0.5000 mg | ORAL_TABLET | Freq: Three times a day (TID) | ORAL | Status: DC | PRN

## 2015-03-20 NOTE — Telephone Encounter (Signed)
Recd call from [place] at [facility] requesting refill on Alprazolam.

## 2015-03-20 NOTE — Telephone Encounter (Signed)
Faxed hardcopy for Alprazolam to [facility] per PCP instructions/approval.

## 2015-05-16 ENCOUNTER — Ambulatory Visit (INDEPENDENT_AMBULATORY_CARE_PROVIDER_SITE_OTHER): Payer: 59 | Admitting: Family Medicine

## 2015-05-16 ENCOUNTER — Encounter: Payer: Self-pay | Admitting: Family Medicine

## 2015-05-16 VITALS — BP 128/90 | HR 78 | Temp 97.9°F | Resp 18 | Ht 63.0 in | Wt 177.0 lb

## 2015-05-16 DIAGNOSIS — E669 Obesity, unspecified: Secondary | ICD-10-CM

## 2015-05-16 DIAGNOSIS — E782 Mixed hyperlipidemia: Secondary | ICD-10-CM

## 2015-05-16 DIAGNOSIS — E1169 Type 2 diabetes mellitus with other specified complication: Secondary | ICD-10-CM

## 2015-05-16 DIAGNOSIS — R351 Nocturia: Secondary | ICD-10-CM

## 2015-05-16 DIAGNOSIS — E119 Type 2 diabetes mellitus without complications: Secondary | ICD-10-CM

## 2015-05-16 DIAGNOSIS — R030 Elevated blood-pressure reading, without diagnosis of hypertension: Secondary | ICD-10-CM

## 2015-05-16 DIAGNOSIS — F418 Other specified anxiety disorders: Secondary | ICD-10-CM

## 2015-05-16 DIAGNOSIS — Z720 Tobacco use: Secondary | ICD-10-CM

## 2015-05-16 DIAGNOSIS — IMO0001 Reserved for inherently not codable concepts without codable children: Secondary | ICD-10-CM

## 2015-05-16 DIAGNOSIS — F17200 Nicotine dependence, unspecified, uncomplicated: Secondary | ICD-10-CM

## 2015-05-16 LAB — URINALYSIS, ROUTINE W REFLEX MICROSCOPIC
BILIRUBIN URINE: NEGATIVE
Ketones, ur: NEGATIVE
LEUKOCYTES UA: NEGATIVE
NITRITE: NEGATIVE
PH: 5.5 (ref 5.0–8.0)
TOTAL PROTEIN, URINE-UPE24: NEGATIVE
URINE GLUCOSE: NEGATIVE
UROBILINOGEN UA: 0.2 (ref 0.0–1.0)

## 2015-05-16 LAB — HEMOGLOBIN A1C: Hgb A1c MFr Bld: 7 % — ABNORMAL HIGH (ref 4.6–6.5)

## 2015-05-16 MED ORDER — ALPRAZOLAM 0.5 MG PO TABS: 0.5000 mg | ORAL_TABLET | Freq: Three times a day (TID) | ORAL | Status: DC | PRN

## 2015-05-16 NOTE — Progress Notes (Signed)
Pre visit review using our clinic review tool, if applicable. No additional management support is needed unless otherwise documented below in the visit note. 

## 2015-05-16 NOTE — Patient Instructions (Signed)

## 2015-05-16 NOTE — Progress Notes (Signed)
Patient ID: Ashley Jackson, female   DOB: 1961/05/21, 54 y.o.   MRN: [id number]   Subjective:    Patient ID: Ashley Jackson, female    DOB: [birth year], 54 y.o.   MRN: [id number]  Chief Complaint  Patient presents with  . Follow-up    DM. Also has been having a smell with urine for 3-4 months.    HPI Patient is in today for follow-up. Overall she's doing fairly well. Her sertraline is working and she uses alprazolam infrequently. She declines the flu shot today. She denies any recent illness although if she is noting some mild odor with her urine. No dysuria, hematuria, urinary frequency or urgency. Denies CP/palp/SOB/HA/congestion/fevers/GI or GU c/o. Taking meds as prescribed  Past Medical History  Diagnosis Date  . DIABETES MELLITUS, TYPE II, UNCONTROLLED 02/25/2010  . HYPERLIPIDEMIA 02/07/2008  . EXOGENOUS OBESITY 11/07/2007  . Overweight(278.02) 06/24/2010  . Anxiety state, unspecified 07/12/2007  . TOBACCO USER 06/24/2010  . DEPRESSION 07/12/2007  . HYPERGLYCEMIA 07/12/2007  . COLONIC POLYPS, HX OF 05/02/2007  . Tobacco abuse 10/24/2012  . Preventative health care 04/29/2013  . Low back pain 04/29/2013    Past Surgical History  Procedure Laterality Date  . Colonoscopy  2006    Family History  Problem Relation Age of Onset  . Liver cancer Mother   . Hypertension Other   . Breast cancer Neg Hx   . Prostate cancer Neg Hx   . Heart disease Neg Hx   . Diabetes Maternal Uncle   . Hypertension Father   . Stroke Brother   . Seizures Son     Social History   Social History  . Marital Status: Married    Spouse Name: N/A  . Number of Children: N/A  . Years of Education: N/A   Occupational History  . Not on file.   Social History Main Topics  . Smoking status: Current Every Day Smoker -- 1.00 packs/day  . Smokeless tobacco: Never Used  . Alcohol Use: Not on file  . Drug Use: No  . Sexual Activity: Not Currently   Other Topics Concern  . Not on file   Social History Narrative     Outpatient Prescriptions Prior to Visit  Medication Sig Dispense Refill  . sertraline (ZOLOFT) 100 MG tablet Take 1 tablet (100 mg total) by mouth daily. 90 tablet 3  . ALPRAZolam (XANAX) 0.5 MG tablet Take 1 tablet (0.5 mg total) by mouth 3 (three) times daily as needed for anxiety. 90 tablet 0   No facility-administered medications prior to visit.    Allergies  Allergen Reactions  . Levofloxacin     anxiety  . Sulfa Antibiotics     Panic attacks  . Sulfonamide Derivatives     REACTION: Panic attacks    Review of Systems  Constitutional: Negative for fever and malaise/fatigue.  HENT: Negative for congestion.   Eyes: Negative for discharge.  Respiratory: Negative for shortness of breath.   Cardiovascular: Negative for chest pain, palpitations and leg swelling.  Gastrointestinal: Negative for nausea and abdominal pain.  Genitourinary: Negative for dysuria.  Musculoskeletal: Negative for falls.  Skin: Negative for rash.  Neurological: Negative for loss of consciousness and headaches.  Endo/Heme/Allergies: Negative for environmental allergies.  Psychiatric/Behavioral: Negative for depression. The patient is nervous/anxious.        Objective:    Physical Exam  Constitutional: She is oriented to person, place, and time. She appears well-developed and well-nourished. No distress.  HENT:  Head: Normocephalic  and atraumatic.  Nose: Nose normal.  Eyes: Right eye exhibits no discharge. Left eye exhibits no discharge.  Neck: Normal range of motion. Neck supple.  Cardiovascular: Normal rate and regular rhythm.   No murmur heard. Pulmonary/Chest: Effort normal and breath sounds normal.  Abdominal: Soft. Bowel sounds are normal. There is no tenderness.  Musculoskeletal: She exhibits no edema.  Neurological: She is alert and oriented to person, place, and time.  Skin: Skin is warm and dry.  Psychiatric: She has a normal mood and affect.  Nursing note and vitals  reviewed.   BP 128/90 mmHg  Pulse 78  Temp(Src) 97.9 F (36.6 C) (Oral)  Resp 18  Ht  (1.6 m)  Wt 177 lb (80.287 kg)  BMI 31.36 kg/m2  SpO2 98%  LMP 04/09/2015 Wt Readings from Last 3 Encounters:  05/16/15 177 lb (80.287 kg)  11/14/14 167 lb 8 oz (75.978 kg)  08/06/14 178 lb 6.4 oz (80.922 kg)     Lab Results  Component Value Date   WBC 7.5 08/17/2013   HGB 13.6 08/17/2013   HCT 40.3 08/17/2013   PLT 343 08/17/2013   GLUCOSE 143* 08/06/2014   CHOL 206* 08/06/2014   TRIG 153.0* 08/06/2014   HDL 51.60 08/06/2014   LDLDIRECT 131.3 09/30/2010   LDLCALC 124* 08/06/2014   ALT 23 08/06/2014   AST 23 08/06/2014   NA 136 08/06/2014   K 4.0 08/06/2014   CL 108 08/06/2014   CREATININE 0.8 08/06/2014   BUN 14 08/06/2014   CO2 23 08/06/2014   TSH 1.128 08/17/2013   HGBA1C 7.0* 05/16/2015   MICROALBUR 0.72 10/20/2012    Lab Results  Component Value Date   TSH 1.128 08/17/2013   Lab Results  Component Value Date   WBC 7.5 08/17/2013   HGB 13.6 08/17/2013   HCT 40.3 08/17/2013   MCV 85.4 08/17/2013   PLT 343 08/17/2013   Lab Results  Component Value Date   NA 136 08/06/2014   K 4.0 08/06/2014   CO2 23 08/06/2014   GLUCOSE 143* 08/06/2014   BUN 14 08/06/2014   CREATININE 0.8 08/06/2014   BILITOT 0.3 08/06/2014   ALKPHOS 51 08/06/2014   AST 23 08/06/2014   ALT 23 08/06/2014   PROT 7.4 08/06/2014   ALBUMIN 4.0 08/06/2014   CALCIUM 9.1 08/06/2014   GFR 79.68 08/06/2014   Lab Results  Component Value Date   CHOL 206* 08/06/2014   Lab Results  Component Value Date   HDL 51.60 08/06/2014   Lab Results  Component Value Date   LDLCALC 124* 08/06/2014   Lab Results  Component Value Date   TRIG 153.0* 08/06/2014   Lab Results  Component Value Date   CHOLHDL 4 08/06/2014   Lab Results  Component Value Date   HGBA1C 7.0* 05/16/2015       Assessment & Plan:   Problem List Items Addressed This Visit    TOBACCO USER    Encouraged complete  cessation. Discussed need to quit as relates to risk of numerous cancers, cardiac and pulmonary disease as well as neurologic complications. Counseled for greater than 3 minutes      Obesity    Encouraged DASH diet, decrease po intake and increase exercise as tolerated. Needs 7-8 hours of sleep nightly. Avoid trans fats, eat small, frequent meals every 4-5 hours with lean proteins, complex carbs and healthy fats. Minimize simple carbs, GMO foods.      Hyperlipidemia, mixed    Encouraged heart healthy  diet, increase exercise, avoid trans fats, consider a krill oil cap daily      Elevated blood pressure    Well controlled. Encouraged heart healthy diet such as the DASH diet and exercise as tolerated.       Diabetes mellitus type 2 in obese    hgba1c acceptable, minimize simple carbs. Increase exercise as tolerated. Continue current meds      Relevant Orders   Hemoglobin A1c (Completed)   Depression with anxiety    Doing well on current meds       Other Visit Diagnoses    Nocturia    -  Primary    Relevant Orders    Urinalysis    Urine culture (Completed)       I am having Ms. Kolk maintain her sertraline and ALPRAZolam.  Meds ordered this encounter  Medications  . ALPRAZolam (XANAX) 0.5 MG tablet    Sig: Take 1 tablet (0.5 mg total) by mouth 3 (three) times daily as needed for anxiety.    Dispense:  90 tablet    Refill:  5     Danise Edge, MD

## 2015-05-18 LAB — URINE CULTURE: Colony Count: 15000

## 2015-05-23 ENCOUNTER — Encounter: Payer: Self-pay | Admitting: Family Medicine

## 2015-05-23 NOTE — Assessment & Plan Note (Signed)
Encouraged heart healthy diet, increase exercise, avoid trans fats, consider a krill oil cap daily 

## 2015-05-23 NOTE — Assessment & Plan Note (Signed)
Doing well on current meds.

## 2015-05-23 NOTE — Assessment & Plan Note (Addendum)
Encouraged complete cessation. Discussed need to quit as relates to risk of numerous cancers, cardiac and pulmonary disease as well as neurologic complications. Counseled for greater than 3 minutes 

## 2015-05-23 NOTE — Assessment & Plan Note (Signed)
Well controlled. Encouraged heart healthy diet such as the DASH diet and exercise as tolerated.  

## 2015-05-23 NOTE — Assessment & Plan Note (Signed)
hgba1c acceptable, minimize simple carbs. Increase exercise as tolerated. Continue current meds 

## 2015-05-23 NOTE — Assessment & Plan Note (Signed)
Encouraged DASH diet, decrease po intake and increase exercise as tolerated. Needs 7-8 hours of sleep nightly. Avoid trans fats, eat small, frequent meals every 4-5 hours with lean proteins, complex carbs and healthy fats. Minimize simple carbs, GMO foods. 

## 2015-08-05 ENCOUNTER — Telehealth: Payer: Self-pay | Admitting: Behavioral Health

## 2015-08-05 NOTE — Telephone Encounter (Signed)
Care gaps to be addressed: Mammogram Cervical Screening Colonoscopy Microalbumin Flu Shot  Unable to reach patient at this time. Left message for patient to return call when available. Yearly physical has already been scheduled for 11/14/15 with PCP.

## 2015-08-20 ENCOUNTER — Other Ambulatory Visit: Payer: Self-pay | Admitting: Family Medicine

## 2015-09-24 MED FILL — SERTRALINE HCL 100 MG TAB: 100 | 30 days supply | Qty: 30 | Fill #1

## 2015-09-24 MED FILL — ALPRAZolam 0.5 MG TABS: 0.5 | 30 days supply | Qty: 90 | Fill #3

## 2015-09-24 MED FILL — AMOXICILLIN 500 MG CAPSULE: 500 | 30 days supply | Qty: 24 | Fill #1

## 2015-11-10 MED FILL — ALPRAZolam 0.5 MG TABS: 0.5 | 30 days supply | Qty: 90 | Fill #4

## 2015-11-10 MED FILL — SERTRALINE HCL 100 MG TAB: 100 | 30 days supply | Qty: 30 | Fill #2

## 2015-11-14 ENCOUNTER — Encounter: Payer: 59 | Admitting: Family Medicine

## 2015-12-22 ENCOUNTER — Other Ambulatory Visit: Payer: Self-pay | Admitting: Family Medicine

## 2015-12-22 MED FILL — SERTRALINE HCL 100 MG TAB: 100 | 30 days supply | Qty: 30 | Fill #3

## 2015-12-25 MED ORDER — ALPRAZOLAM 0.5 MG PO TABS: 0.5000 mg | ORAL_TABLET | Freq: Three times a day (TID) | ORAL | Status: DC | PRN

## 2015-12-25 MED FILL — ALPRAZolam 0.5 MG TABS: 0.5 | 30 days supply | Qty: 90 | Fill #0

## 2015-12-25 NOTE — Addendum Note (Signed)
Addended by: Scharlene GlossEWING, Khyle Goodell B on: 12/25/2015 09:26 AM   Modules accepted: Orders

## 2015-12-25 NOTE — Telephone Encounter (Signed)
Faxed hardcopy to Alprazolam to [facility]

## 2015-12-25 NOTE — Telephone Encounter (Signed)
Pt called to schedule appt for 01/20/16 and asked if her Alprazolam can be refilled until her appt

## 2016-01-20 ENCOUNTER — Encounter: Payer: Self-pay | Admitting: Family Medicine

## 2016-01-20 ENCOUNTER — Ambulatory Visit (INDEPENDENT_AMBULATORY_CARE_PROVIDER_SITE_OTHER): Payer: BLUE CROSS/BLUE SHIELD | Admitting: Family Medicine

## 2016-01-20 VITALS — BP 120/82 | HR 63 | Temp 98.2°F | Ht 63.0 in | Wt 173.1 lb

## 2016-01-20 DIAGNOSIS — E119 Type 2 diabetes mellitus without complications: Secondary | ICD-10-CM

## 2016-01-20 DIAGNOSIS — F418 Other specified anxiety disorders: Secondary | ICD-10-CM

## 2016-01-20 DIAGNOSIS — E1169 Type 2 diabetes mellitus with other specified complication: Secondary | ICD-10-CM

## 2016-01-20 DIAGNOSIS — R002 Palpitations: Secondary | ICD-10-CM | POA: Insufficient documentation

## 2016-01-20 DIAGNOSIS — E118 Type 2 diabetes mellitus with unspecified complications: Secondary | ICD-10-CM | POA: Diagnosis not present

## 2016-01-20 DIAGNOSIS — E669 Obesity, unspecified: Secondary | ICD-10-CM

## 2016-01-20 DIAGNOSIS — F17200 Nicotine dependence, unspecified, uncomplicated: Secondary | ICD-10-CM | POA: Diagnosis not present

## 2016-01-20 HISTORY — DX: Palpitations: R00.2

## 2016-01-20 LAB — HEMOGLOBIN A1C: HEMOGLOBIN A1C: 7 % — AB (ref 4.6–6.5)

## 2016-01-20 MED ORDER — ALPRAZOLAM 0.5 MG PO TABS: 0.5000 mg | ORAL_TABLET | Freq: Three times a day (TID) | ORAL | Status: DC | PRN

## 2016-01-20 MED ORDER — SERTRALINE HCL 100 MG PO TABS: 100.0000 mg | ORAL_TABLET | Freq: Every day | ORAL | Status: DC

## 2016-01-20 NOTE — Progress Notes (Signed)
Patient ID: Ashley SiaMary K Linehan, female   DOB: 11-Jan-1961, 55 y.o.   MRN: [id number]   Subjective:    Patient ID: Ashley Jackson, female    DOB: 11-Jan-1961, 55 y.o.   MRN: [id number]  Chief Complaint  Patient presents with  . Follow-up    HPI Patient is in today for follow up. She had a prolonged episode at work several weeks ago. Went to ER at [facility] and had cardiology follow up. EKG, vitals and labs unremarkable per patient and she has felt well sicne then. She acknowledges hi levels of caffeine and stress prior to this episode. No further episodes since then. Denies CP/SOB/HA/congestion/fevers/GI or GU c/o. Taking meds as prescribed  Past Medical History  Diagnosis Date  . DIABETES MELLITUS, TYPE II, UNCONTROLLED 02/25/2010  . HYPERLIPIDEMIA 02/07/2008  . EXOGENOUS OBESITY 11/07/2007  . Overweight(278.02) 06/24/2010  . Anxiety state, unspecified 07/12/2007  . TOBACCO USER 06/24/2010  . DEPRESSION 07/12/2007  . HYPERGLYCEMIA 07/12/2007  . COLONIC POLYPS, HX OF 05/02/2007  . Tobacco abuse 10/24/2012  . Preventative health care 04/29/2013  . Low back pain 04/29/2013  . Palpitations 01/20/2016    Past Surgical History  Procedure Laterality Date  . Colonoscopy  2006    Family History  Problem Relation Age of Onset  . Liver cancer Mother   . Hypertension Other   . Breast cancer Neg Hx   . Prostate cancer Neg Hx   . Heart disease Neg Hx   . Diabetes Maternal Uncle   . Hypertension Father   . Stroke Brother   . Seizures Son     Social History   Social History  . Marital Status: Married    Spouse Name: N/A  . Number of Children: N/A  . Years of Education: N/A   Occupational History  . Not on file.   Social History Main Topics  . Smoking status: Current Every Day Smoker -- 1.00 packs/day  . Smokeless tobacco: Never Used  . Alcohol Use: Not on file  . Drug Use: No  . Sexual Activity: Not Currently   Other Topics Concern  . Not on file   Social History Narrative    Outpatient  Prescriptions Prior to Visit  Medication Sig Dispense Refill  . ALPRAZolam (XANAX) 0.5 MG tablet Take 1 tablet (0.5 mg total) by mouth 3 (three) times daily as needed for anxiety. 90 tablet 5  . sertraline (ZOLOFT) 100 MG tablet TAKE 1 TABLET BY MOUTH DAILY 90 tablet 3   No facility-administered medications prior to visit.    Allergies  Allergen Reactions  . Levofloxacin     anxiety  . Sulfa Antibiotics     Panic attacks  . Sulfonamide Derivatives     REACTION: Panic attacks    Review of Systems  Constitutional: Negative for fever and malaise/fatigue.  HENT: Negative for congestion.   Eyes: Negative for blurred vision.  Respiratory: Negative for shortness of breath.   Cardiovascular: Positive for palpitations. Negative for chest pain and leg swelling.  Gastrointestinal: Negative for nausea, abdominal pain and blood in stool.  Genitourinary: Negative for dysuria and frequency.  Musculoskeletal: Negative for falls.  Skin: Negative for rash.  Neurological: Negative for dizziness, loss of consciousness and headaches.  Endo/Heme/Allergies: Negative for environmental allergies.  Psychiatric/Behavioral: Negative for depression. The patient is nervous/anxious.        Objective:    Physical Exam  Constitutional: She is oriented to person, place, and time. She appears well-developed and well-nourished. No distress.  HENT:  Head: Normocephalic and atraumatic.  Nose: Nose normal.  Eyes: Right eye exhibits no discharge. Left eye exhibits no discharge.  Neck: Normal range of motion. Neck supple.  Cardiovascular: Normal rate and regular rhythm.   No murmur heard. Pulmonary/Chest: Effort normal and breath sounds normal.  Abdominal: Soft. Bowel sounds are normal. There is no tenderness.  Musculoskeletal: She exhibits no edema.  Neurological: She is alert and oriented to person, place, and time.  Skin: Skin is warm and dry.  Psychiatric: She has a normal mood and affect.  Nursing  note and vitals reviewed.   BP 120/82 mmHg  Pulse 63  Temp(Src) 98.2 F (36.8 C) (Oral)  Ht 5\' 3"  (1.6 m)  Wt 173 lb 2 oz (78.529 kg)  BMI 30.68 kg/m2  SpO2 95% Wt Readings from Last 3 Encounters:  01/20/16 173 lb 2 oz (78.529 kg)  05/16/15 177 lb (80.287 kg)  11/14/14 167 lb 8 oz (75.978 kg)     Lab Results  Component Value Date   WBC 7.5 08/17/2013   HGB 13.6 08/17/2013   HCT 40.3 08/17/2013   PLT 343 08/17/2013   GLUCOSE 143* 08/06/2014   CHOL 206* 08/06/2014   TRIG 153.0* 08/06/2014   HDL 51.60 08/06/2014   LDLDIRECT 131.3 09/30/2010   LDLCALC 124* 08/06/2014   ALT 23 08/06/2014   AST 23 08/06/2014   NA 136 08/06/2014   K 4.0 08/06/2014   CL 108 08/06/2014   CREATININE 0.8 08/06/2014   BUN 14 08/06/2014   CO2 23 08/06/2014   TSH 1.128 08/17/2013   HGBA1C 7.0* 05/16/2015   MICROALBUR 0.72 10/20/2012    Lab Results  Component Value Date   TSH 1.128 08/17/2013   Lab Results  Component Value Date   WBC 7.5 08/17/2013   HGB 13.6 08/17/2013   HCT 40.3 08/17/2013   MCV 85.4 08/17/2013   PLT 343 08/17/2013   Lab Results  Component Value Date   NA 136 08/06/2014   K 4.0 08/06/2014   CO2 23 08/06/2014   GLUCOSE 143* 08/06/2014   BUN 14 08/06/2014   CREATININE 0.8 08/06/2014   BILITOT 0.3 08/06/2014   ALKPHOS 51 08/06/2014   AST 23 08/06/2014   ALT 23 08/06/2014   PROT 7.4 08/06/2014   ALBUMIN 4.0 08/06/2014   CALCIUM 9.1 08/06/2014   GFR 79.68 08/06/2014   Lab Results  Component Value Date   CHOL 206* 08/06/2014   Lab Results  Component Value Date   HDL 51.60 08/06/2014   Lab Results  Component Value Date   LDLCALC 124* 08/06/2014   Lab Results  Component Value Date   TRIG 153.0* 08/06/2014   Lab Results  Component Value Date   CHOLHDL 4 08/06/2014   Lab Results  Component Value Date   HGBA1C 7.0* 05/16/2015       Assessment & Plan:   Problem List Items Addressed This Visit    TOBACCO USER    Encouraged complete  cessation. Discussed need to quit as relates to risk of numerous cancers, cardiac and pulmonary disease as well as neurologic complications. Counseled for greater than 3 minutes. Maybe slightly under a ppd      Palpitations - Primary    Had an episode of palpitations that lasted almost 12 hours a couple of weeks ago, presented to [facility] ER and had a cardiology follow up. EKG and labs were unremarkable. Will request records. No associated       Relevant Medications   ALPRAZolam (  XANAX) 0.5 MG tablet   Diabetes mellitus type 2 in obese (HCC)    hgba1c acceptable on last check, minimize simple carbs. Increase exercise as tolerated. Continue current meds. Recheck A1C today. Patient declines foot exam today      Depression with anxiety   Relevant Medications   ALPRAZolam (XANAX) 0.5 MG tablet    Other Visit Diagnoses    Controlled type 2 diabetes mellitus with complication, without long-term current use of insulin (HCC)        Relevant Orders    Hemoglobin A1c       I have changed Ms. Kissner's sertraline. I am also having her maintain her ALPRAZolam.  Meds ordered this encounter  Medications  . sertraline (ZOLOFT) 100 MG tablet    Sig: Take 1 tablet (100 mg total) by mouth daily.    Dispense:  90 tablet    Refill:  3  . ALPRAZolam (XANAX) 0.5 MG tablet    Sig: Take 1 tablet (0.5 mg total) by mouth 3 (three) times daily as needed for anxiety.    Dispense:  90 tablet    Refill:  5     Danise Edge, MD

## 2016-01-20 NOTE — Assessment & Plan Note (Signed)
Encouraged complete cessation. Discussed need to quit as relates to risk of numerous cancers, cardiac and pulmonary disease as well as neurologic complications. Counseled for greater than 3 minutes. Maybe slightly under a ppd

## 2016-01-20 NOTE — Assessment & Plan Note (Signed)
Had an episode of palpitations that lasted almost 12 hours a couple of weeks ago, presented to [facility] ER and had a cardiology follow up. EKG and labs were unremarkable. Will request records. No associated

## 2016-01-20 NOTE — Assessment & Plan Note (Addendum)
hgba1c acceptable on last check, minimize simple carbs. Increase exercise as tolerated. Continue current meds. Recheck A1C today. Patient declines foot exam today

## 2016-01-20 NOTE — Patient Instructions (Signed)

## 2016-01-20 NOTE — Progress Notes (Signed)
Pre visit review using our clinic review tool, if applicable. No additional management support is needed unless otherwise documented below in the visit note. 

## 2016-02-11 ENCOUNTER — Encounter: Payer: Self-pay | Admitting: Family Medicine

## 2016-02-11 MED FILL — ALPRAZolam 0.5 MG TABS: 0.5 | 30 days supply | Qty: 90 | Fill #1

## 2016-02-11 MED FILL — SERTRALINE HCL 100 MG TAB: 100 | 30 days supply | Qty: 30 | Fill #4

## 2016-03-25 MED FILL — ALPRAZolam 0.5 MG TABS: 0.5 | 30 days supply | Qty: 90 | Fill #2

## 2016-03-25 MED FILL — AMOXICILLIN 500 MG CAPSULE: 500 | 30 days supply | Qty: 24 | Fill #2

## 2016-03-25 MED FILL — SERTRALINE HCL 100 MG TAB: 100 | 30 days supply | Qty: 30 | Fill #5

## 2016-04-28 MED FILL — SERTRALINE HCL 100 MG TAB: 100 | 30 days supply | Qty: 30 | Fill #6

## 2016-04-28 MED FILL — ALPRAZolam 0.5 MG TABS: 0.5 | 30 days supply | Qty: 90 | Fill #3

## 2016-06-17 MED FILL — SERTRALINE HCL 100 MG TAB: 100 | 30 days supply | Qty: 30 | Fill #7

## 2016-06-17 MED FILL — ALPRAZolam 0.5 MG TABS: 0.5 | 30 days supply | Qty: 90 | Fill #4

## 2016-07-22 ENCOUNTER — Encounter: Payer: Self-pay | Admitting: Family Medicine

## 2016-07-22 ENCOUNTER — Ambulatory Visit (INDEPENDENT_AMBULATORY_CARE_PROVIDER_SITE_OTHER): Payer: BLUE CROSS/BLUE SHIELD | Admitting: Family Medicine

## 2016-07-22 ENCOUNTER — Telehealth: Payer: Self-pay | Admitting: Family Medicine

## 2016-07-22 ENCOUNTER — Other Ambulatory Visit: Payer: Self-pay | Admitting: Family Medicine

## 2016-07-22 VITALS — BP 128/86 | HR 58 | Temp 97.7°F | Wt 186.8 lb

## 2016-07-22 DIAGNOSIS — R002 Palpitations: Secondary | ICD-10-CM

## 2016-07-22 DIAGNOSIS — E669 Obesity, unspecified: Secondary | ICD-10-CM

## 2016-07-22 DIAGNOSIS — E782 Mixed hyperlipidemia: Secondary | ICD-10-CM

## 2016-07-22 DIAGNOSIS — E1169 Type 2 diabetes mellitus with other specified complication: Secondary | ICD-10-CM | POA: Diagnosis not present

## 2016-07-22 DIAGNOSIS — F418 Other specified anxiety disorders: Secondary | ICD-10-CM | POA: Diagnosis not present

## 2016-07-22 LAB — LIPID PANEL
CHOL/HDL RATIO: 3
CHOLESTEROL: 187 mg/dL (ref 0–200)
HDL: 57.1 mg/dL (ref >39.00)
LDL CALC: 90 mg/dL (ref 0–99)
NonHDL: 130.04
TRIGLYCERIDES: 199 mg/dL — AB (ref 0.0–149.0)
VLDL: 39.8 mg/dL (ref 0.0–40.0)

## 2016-07-22 LAB — HEMOGLOBIN A1C: Hgb A1c MFr Bld: 7.6 % — ABNORMAL HIGH (ref 4.6–6.5)

## 2016-07-22 MED ORDER — ALPRAZOLAM 0.5 MG PO TABS: 0.5000 mg | ORAL_TABLET | Freq: Three times a day (TID) | ORAL | 5 refills | Status: DC | PRN

## 2016-07-22 NOTE — Progress Notes (Signed)
Patient ID: Ashley Jackson, female   DOB: November 21, 1960, 55 y.o.   MRN: [id number]   Subjective:    Patient ID: Ashley Jackson, female    DOB: November 21, 1960, 55 y.o.   MRN: [id number]  Chief Complaint  Patient presents with  . Follow-up    HPI Patient is in today for 6 month follow, she is accompanied by her daughter. No recent illness or acute concerns. She is doing well at home. She denies any recent hospitalizations. She is doing well with ADLs. Denies CP/palp/SOB/HA/congestion/fevers/GI or GU c/o. Taking meds as prescribed  Past Medical History:  Diagnosis Date  . Anxiety state, unspecified 07/12/2007  . COLONIC POLYPS, HX OF 05/02/2007  . DEPRESSION 07/12/2007  . DIABETES MELLITUS, TYPE II, UNCONTROLLED 02/25/2010  . EXOGENOUS OBESITY 11/07/2007  . HYPERGLYCEMIA 07/12/2007  . HYPERLIPIDEMIA 02/07/2008  . Low back pain 04/29/2013  . Overweight(278.02) 06/24/2010  . Palpitations 01/20/2016  . Preventative health care 04/29/2013  . Tobacco abuse 10/24/2012  . TOBACCO USER 06/24/2010    Past Surgical History:  Procedure Laterality Date  . COLONOSCOPY  2006    Family History  Problem Relation Age of Onset  . Liver cancer Mother   . Hypertension Other   . Breast cancer Neg Hx   . Prostate cancer Neg Hx   . Heart disease Neg Hx   . Diabetes Maternal Uncle   . Hypertension Father   . Stroke Brother   . Seizures Son     Social History   Social History  . Marital status: Married    Spouse name: N/A  . Number of children: N/A  . Years of education: N/A   Occupational History  . Not on file.   Social History Main Topics  . Smoking status: Current Every Day Smoker    Packs/day: 1.00  . Smokeless tobacco: Never Used  . Alcohol use Not on file  . Drug use: No  . Sexual activity: Not Currently   Other Topics Concern  . Not on file   Social History Narrative  . No narrative on file    Outpatient Medications Prior to Visit  Medication Sig Dispense Refill  . sertraline (ZOLOFT) 100  MG tablet Take 1 tablet (100 mg total) by mouth daily. 90 tablet 3  . ALPRAZolam (XANAX) 0.5 MG tablet Take 1 tablet (0.5 mg total) by mouth 3 (three) times daily as needed for anxiety. 90 tablet 5   No facility-administered medications prior to visit.     Allergies  Allergen Reactions  . Levofloxacin     anxiety  . Sulfa Antibiotics     Panic attacks  . Sulfonamide Derivatives     REACTION: Panic attacks    Review of Systems  Constitutional: Negative for fever.  Eyes: Negative for blurred vision.  Respiratory: Negative for cough and shortness of breath.   Cardiovascular: Negative for chest pain and palpitations.  Gastrointestinal: Negative for vomiting.  Musculoskeletal: Negative for back pain.  Skin: Negative for rash.  Neurological: Negative for loss of consciousness and headaches.       Objective:    Physical Exam  Constitutional: She is oriented to person, place, and time. She appears well-developed and well-nourished. No distress.  HENT:  Head: Normocephalic and atraumatic.  Eyes: Conjunctivae are normal.  Neck: Normal range of motion. No thyromegaly present.  Cardiovascular: Normal rate and regular rhythm.   Pulmonary/Chest: Effort normal and breath sounds normal. She has no wheezes.  Abdominal: Soft. Bowel sounds are normal. There  is no tenderness.  Musculoskeletal: Normal range of motion. She exhibits no edema or deformity.  Neurological: She is alert and oriented to person, place, and time.  Skin: Skin is warm and dry. She is not diaphoretic.  Psychiatric: She has a normal mood and affect.    BP 128/86 (BP Location: Left Arm, Patient Position: Sitting, Cuff Size: Normal)   Pulse (!) 58   Temp 97.7 F (36.5 C) (Oral)   Wt 186 lb 12.8 oz (84.7 kg)   SpO2 97%   BMI 33.09 kg/m  Wt Readings from Last 3 Encounters:  07/22/16 186 lb 12.8 oz (84.7 kg)  01/20/16 173 lb 2 oz (78.5 kg)  05/16/15 177 lb (80.3 kg)     Lab Results  Component Value Date   WBC  7.5 08/17/2013   HGB 13.6 08/17/2013   HCT 40.3 08/17/2013   PLT 343 08/17/2013   GLUCOSE 143 (H) 08/06/2014   CHOL 187 07/22/2016   TRIG 199.0 (H) 07/22/2016   HDL 57.10 07/22/2016   LDLDIRECT 131.3 09/30/2010   LDLCALC 90 07/22/2016   ALT 23 08/06/2014   AST 23 08/06/2014   NA 136 08/06/2014   K 4.0 08/06/2014   CL 108 08/06/2014   CREATININE 0.8 08/06/2014   BUN 14 08/06/2014   CO2 23 08/06/2014   TSH 1.128 08/17/2013   HGBA1C 7.6 (H) 07/22/2016   MICROALBUR 0.72 10/20/2012    Lab Results  Component Value Date   TSH 1.128 08/17/2013   Lab Results  Component Value Date   WBC 7.5 08/17/2013   HGB 13.6 08/17/2013   HCT 40.3 08/17/2013   MCV 85.4 08/17/2013   PLT 343 08/17/2013   Lab Results  Component Value Date   NA 136 08/06/2014   K 4.0 08/06/2014   CO2 23 08/06/2014   GLUCOSE 143 (H) 08/06/2014   BUN 14 08/06/2014   CREATININE 0.8 08/06/2014   BILITOT 0.3 08/06/2014   ALKPHOS 51 08/06/2014   AST 23 08/06/2014   ALT 23 08/06/2014   PROT 7.4 08/06/2014   ALBUMIN 4.0 08/06/2014   CALCIUM 9.1 08/06/2014   GFR 79.68 08/06/2014   Lab Results  Component Value Date   CHOL 187 07/22/2016   Lab Results  Component Value Date   HDL 57.10 07/22/2016   Lab Results  Component Value Date   LDLCALC 90 07/22/2016   Lab Results  Component Value Date   TRIG 199.0 (H) 07/22/2016   Lab Results  Component Value Date   CHOLHDL 3 07/22/2016   Lab Results  Component Value Date   HGBA1C 7.6 (H) 07/22/2016       Assessment & Plan:   Problem List Items Addressed This Visit    Diabetes mellitus type 2 in obese (HCC)    hgba1c acceptable, minimize simple carbs. Increase exercise as tolerated.       Relevant Orders   Hemoglobin A1c (Completed)   Hyperlipidemia, mixed    Encouraged heart healthy diet, increase exercise, avoid trans fats, consider a krill oil cap daily      Depression with anxiety    Doing well on current meds. No changes.        Relevant Medications   ALPRAZolam (XANAX) 0.5 MG tablet   Palpitations   Relevant Medications   ALPRAZolam (XANAX) 0.5 MG tablet    Other Visit Diagnoses    Mixed hyperlipidemia    -  Primary   Relevant Orders   Lipid panel (Completed)  I am having Ashley Jackson maintain her sertraline and ALPRAZolam.  Meds ordered this encounter  Medications  . ALPRAZolam (XANAX) 0.5 MG tablet    Sig: Take 1 tablet (0.5 mg total) by mouth 3 (three) times daily as needed for anxiety.    Dispense:  90 tablet    Refill:  5   CMA served as scribe during visit. Agree with her documentation and attest to all history and physical information provided.   Danise EdgeBLYTH, STACEY, MD

## 2016-07-22 NOTE — Patient Instructions (Signed)
Carbohydrate Counting for Diabetes Mellitus, Adult Carbohydrate counting is a method for keeping track of how many carbohydrates you eat. Eating carbohydrates naturally increases the amount of sugar (glucose) in the blood. Counting how many carbohydrates you eat helps keep your blood glucose within normal limits, which helps you manage your diabetes (diabetes mellitus). It is important to know how many carbohydrates you can safely have in each meal. This is different for every person. A diet and nutrition specialist (registered dietitian) can help you make a meal plan and calculate how many carbohydrates you should have at each meal and snack. Carbohydrates are found in the following foods:  Grains, such as breads and cereals.  Dried beans and soy products.  Starchy vegetables, such as potatoes, peas, and corn.  Fruit and fruit juices.  Milk and yogurt.  Sweets and snack foods, such as cake, cookies, candy, chips, and soft drinks. How do I count carbohydrates? There are two ways to count carbohydrates in food. You can use either of the methods or a combination of both. Reading "Nutrition Facts" on packaged food  The "Nutrition Facts" list is included on the labels of almost all packaged foods and beverages in the [place] It includes:  The serving size.  Information about nutrients in each serving, including the grams (g) of carbohydrate per serving. To use the "Nutrition Facts":  Decide how many servings you will have.  Multiply the number of servings by the number of carbohydrates per serving.  The resulting number is the total amount of carbohydrates that you will be having. Learning standard serving sizes of other foods  When you eat foods containing carbohydrates that are not packaged or do not include "Nutrition Facts" on the label, you need to measure the servings in order to count the amount of carbohydrates:  Measure the foods that you will eat with a food scale or measuring  cup, if needed.  Decide how many standard-size servings you will eat.  Multiply the number of servings by 15. Most carbohydrate-rich foods have about 15 g of carbohydrates per serving.  For example, if you eat 8 oz (170 g) of strawberries, you will have eaten 2 servings and 30 g of carbohydrates (2 servings x 15 g = 30 g).  For foods that have more than one food mixed, such as soups and casseroles, you must count the carbohydrates in each food that is included. The following list contains standard serving sizes of common carbohydrate-rich foods. Each of these servings has about 15 g of carbohydrates:   hamburger bun or  English muffin.   oz (15 mL) syrup.   oz (14 g) jelly.  1 slice of bread.  1 six-inch tortilla.  3 oz (85 g) cooked rice or pasta.  4 oz (113 g) cooked dried beans.  4 oz (113 g) starchy vegetable, such as peas, corn, or potatoes.  4 oz (113 g) hot cereal.  4 oz (113 g) mashed potatoes or  of a large baked potato.  4 oz (113 g) canned or frozen fruit.  4 oz (120 mL) fruit juice.  4-6 crackers.  6 chicken nuggets.  6 oz (170 g) unsweetened dry cereal.  6 oz (170 g) plain fat-free yogurt or yogurt sweetened with artificial sweeteners.  8 oz (240 mL) milk.  8 oz (170 g) fresh fruit or one small piece of fruit.  24 oz (680 g) popped popcorn. Example of carbohydrate counting Sample meal  3 oz (85 g) chicken breast.  6 oz (  170 g) brown rice.  4 oz (113 g) corn.  8 oz (240 mL) milk.  8 oz (170 g) strawberries with sugar-free whipped topping. Carbohydrate calculation 1. Identify the foods that contain carbohydrates:  Rice.  Corn.  Milk.  Strawberries. 2. Calculate how many servings you have of each food:  2 servings rice.  1 serving corn.  1 serving milk.  1 serving strawberries. 3. Multiply each number of servings by 15 g:  2 servings rice x 15 g = 30 g.  1 serving corn x 15 g = 15 g.  1 serving milk x 15 g = 15  g.  1 serving strawberries x 15 g = 15 g. 4. Add together all of the amounts to find the total grams of carbohydrates eaten:  30 g + 15 g + 15 g + 15 g = 75 g of carbohydrates total. This information is not intended to replace advice given to you by your health care provider. Make sure you discuss any questions you have with your health care provider. Document Released: 08/09/2005 Document Revised: 02/27/2016 Document Reviewed: 01/21/2016 Elsevier Interactive Patient Education  2017 [employer]  

## 2016-07-22 NOTE — Telephone Encounter (Signed)
Done, see lab results dated 07/22/16 patient has been informed

## 2016-07-22 NOTE — Progress Notes (Signed)
Pre visit review using our clinic review tool, if applicable. No additional management support is needed unless otherwise documented below in the visit note. 

## 2016-07-22 NOTE — Telephone Encounter (Signed)
Relation to GN:FAOZpt:self Call back number:[phone]   Reason for call:  Patient returning call advised to leave detail message regarding lab results.

## 2016-08-01 NOTE — Assessment & Plan Note (Signed)
Doing well on current meds. No changes  

## 2016-08-01 NOTE — Assessment & Plan Note (Signed)
hgba1c acceptable, minimize simple carbs. Increase exercise as tolerated.  

## 2016-08-01 NOTE — Assessment & Plan Note (Signed)
Encouraged heart healthy diet, increase exercise, avoid trans fats, consider a krill oil cap daily 

## 2016-08-10 MED FILL — SERTRALINE HCL 100 MG TAB: 100 | 30 days supply | Qty: 30 | Fill #8

## 2016-08-10 MED FILL — ALPRAZolam 0.5 MG TABS: 0.5 | 30 days supply | Qty: 90 | Fill #0

## 2016-08-11 MED FILL — AMOXICILLIN 500 MG CAPSULE: 500 | 30 days supply | Qty: 24 | Fill #0

## 2016-09-22 MED FILL — ALPRAZolam 0.5 MG TABS: 0.5 | 30 days supply | Qty: 90 | Fill #1

## 2016-09-22 MED FILL — SERTRALINE HCL 100 MG TAB: 100 | 90 days supply | Qty: 90 | Fill #0

## 2016-10-19 ENCOUNTER — Other Ambulatory Visit (INDEPENDENT_AMBULATORY_CARE_PROVIDER_SITE_OTHER): Payer: BLUE CROSS/BLUE SHIELD

## 2016-10-19 DIAGNOSIS — E782 Mixed hyperlipidemia: Secondary | ICD-10-CM

## 2016-10-19 DIAGNOSIS — E1169 Type 2 diabetes mellitus with other specified complication: Secondary | ICD-10-CM | POA: Diagnosis not present

## 2016-10-19 LAB — LIPID PANEL
CHOL/HDL RATIO: 4
Cholesterol: 184 mg/dL (ref 0–200)
HDL: 49.5 mg/dL (ref >39.00)
LDL CALC: 100 mg/dL — AB (ref 0–99)
NonHDL: 134.1
Triglycerides: 171 mg/dL — ABNORMAL HIGH (ref 0.0–149.0)
VLDL: 34.2 mg/dL (ref 0.0–40.0)

## 2016-10-19 LAB — COMPREHENSIVE METABOLIC PANEL
ALT: 20 U/L (ref 0–35)
AST: 20 U/L (ref 0–37)
Albumin: 4.1 g/dL (ref 3.5–5.2)
Alkaline Phosphatase: 60 U/L (ref 39–117)
BUN: 16 mg/dL (ref 6–23)
CHLORIDE: 105 meq/L (ref 96–112)
CO2: 28 meq/L (ref 19–32)
CREATININE: 0.92 mg/dL (ref 0.40–1.20)
Calcium: 9.4 mg/dL (ref 8.4–10.5)
GFR: 67.25 mL/min (ref >60.00)
Glucose, Bld: 139 mg/dL — ABNORMAL HIGH (ref 70–99)
Potassium: 4.4 mEq/L (ref 3.5–5.1)
SODIUM: 140 meq/L (ref 135–145)
Total Bilirubin: 0.3 mg/dL (ref 0.2–1.2)
Total Protein: 7.3 g/dL (ref 6.0–8.3)

## 2016-10-19 LAB — HEMOGLOBIN A1C: HEMOGLOBIN A1C: 7.4 % — AB (ref 4.6–6.5)

## 2016-10-22 ENCOUNTER — Encounter: Payer: Self-pay | Admitting: Family Medicine

## 2016-10-22 ENCOUNTER — Ambulatory Visit (INDEPENDENT_AMBULATORY_CARE_PROVIDER_SITE_OTHER): Payer: BLUE CROSS/BLUE SHIELD | Admitting: Family Medicine

## 2016-10-22 DIAGNOSIS — R002 Palpitations: Secondary | ICD-10-CM

## 2016-10-22 DIAGNOSIS — F17200 Nicotine dependence, unspecified, uncomplicated: Secondary | ICD-10-CM | POA: Diagnosis not present

## 2016-10-22 DIAGNOSIS — E669 Obesity, unspecified: Secondary | ICD-10-CM

## 2016-10-22 DIAGNOSIS — E1169 Type 2 diabetes mellitus with other specified complication: Secondary | ICD-10-CM | POA: Diagnosis not present

## 2016-10-22 DIAGNOSIS — F418 Other specified anxiety disorders: Secondary | ICD-10-CM | POA: Diagnosis not present

## 2016-10-22 MED ORDER — ALPRAZOLAM 0.5 MG PO TABS: 0.5000 mg | ORAL_TABLET | Freq: Three times a day (TID) | ORAL | 5 refills | Status: DC | PRN

## 2016-10-22 MED ORDER — SERTRALINE HCL 50 MG PO TABS: 50.0000 mg | ORAL_TABLET | Freq: Every day | ORAL | 3 refills | Status: DC

## 2016-10-22 MED ORDER — GUAIFENESIN-CODEINE 100-10 MG/5ML PO SYRP: 5.0000 mL | ORAL_SOLUTION | Freq: Three times a day (TID) | ORAL | 0 refills | Status: DC | PRN

## 2016-10-22 MED FILL — ALPRAZolam 0.5 MG TABS: 0.5 | 30 days supply | Qty: 90 | Fill #0

## 2016-10-22 MED FILL — SERTRALINE HCL 50 MG TABLET: 50 | 30 days supply | Qty: 30 | Fill #0

## 2016-10-22 MED FILL — VIRTUSSIN AC LIQUID: 100-10 | 8 days supply | Qty: 120 | Fill #0

## 2016-10-22 NOTE — Assessment & Plan Note (Signed)
Encouraged complete cessation. Discussed need to quit as relates to risk of numerous cancers, cardiac and pulmonary disease as well as neurologic complications. Counseled for greater than 3 minutes. Still smoking with illness. Declines to try cessation

## 2016-10-22 NOTE — Assessment & Plan Note (Signed)
Has noted a big increase in stress since nshe had to move in with her father who is in the early stages of Alzheimer's and she is struggling with lose of privacy and independence. No ability to have a social life or visit with friends. She continues on Sertraline and Alprazolam

## 2016-10-22 NOTE — Assessment & Plan Note (Signed)
Encouraged DASH diet, decrease po intake and increase exercise as tolerated. Needs 7-8 hours of sleep nightly. Avoid trans fats, eat small, frequent meals every 4-5 hours with lean proteins, complex carbs and healthy fats. Minimize simple carbs. She has been eating less due to stress.

## 2016-10-22 NOTE — Progress Notes (Signed)
Pre visit review using our clinic review tool, if applicable. No additional management support is needed unless otherwise documented below in the visit note. 

## 2016-10-22 NOTE — Patient Instructions (Addendum)
Encouraged to incorporate Aged/black garlic, elderberry (liquid or capsule), Vitamin C (500-1,000), zinc and the NOW Probiotic into your daily regimen. Carbohydrate Counting for Diabetes Mellitus, Adult Carbohydrate counting is a method for keeping track of how many carbohydrates you eat. Eating carbohydrates naturally increases the amount of sugar (glucose) in the blood. Counting how many carbohydrates you eat helps keep your blood glucose within normal limits, which helps you manage your diabetes (diabetes mellitus). It is important to know how many carbohydrates you can safely have in each meal. This is different for every person. A diet and nutrition specialist (registered dietitian) can help you make a meal plan and calculate how many carbohydrates you should have at each meal and snack. Carbohydrates are found in the following foods:  Grains, such as breads and cereals.  Dried beans and soy products.  Starchy vegetables, such as potatoes, peas, and corn.  Fruit and fruit juices.  Milk and yogurt.  Sweets and snack foods, such as cake, cookies, candy, chips, and soft drinks. How do I count carbohydrates? There are two ways to count carbohydrates in food. You can use either of the methods or a combination of both. Reading "Nutrition Facts" on packaged food  The "Nutrition Facts" list is included on the labels of almost all packaged foods and beverages in the [place] It includes:  The serving size.  Information about nutrients in each serving, including the grams (g) of carbohydrate per serving. To use the "Nutrition Facts":  Decide how many servings you will have.  Multiply the number of servings by the number of carbohydrates per serving.  The resulting number is the total amount of carbohydrates that you will be having. Learning standard serving sizes of other foods  When you eat foods containing carbohydrates that are not packaged or do not include "Nutrition Facts" on the label,  you need to measure the servings in order to count the amount of carbohydrates:  Measure the foods that you will eat with a food scale or measuring cup, if needed.  Decide how many standard-size servings you will eat.  Multiply the number of servings by 15. Most carbohydrate-rich foods have about 15 g of carbohydrates per serving.  For example, if you eat 8 oz (170 g) of strawberries, you will have eaten 2 servings and 30 g of carbohydrates (2 servings x 15 g = 30 g).  For foods that have more than one food mixed, such as soups and casseroles, you must count the carbohydrates in each food that is included. The following list contains standard serving sizes of common carbohydrate-rich foods. Each of these servings has about 15 g of carbohydrates:   hamburger bun or  English muffin.   oz (15 mL) syrup.   oz (14 g) jelly.  1 slice of bread.  1 six-inch tortilla.  3 oz (85 g) cooked rice or pasta.  4 oz (113 g) cooked dried beans.  4 oz (113 g) starchy vegetable, such as peas, corn, or potatoes.  4 oz (113 g) hot cereal.  4 oz (113 g) mashed potatoes or  of a large baked potato.  4 oz (113 g) canned or frozen fruit.  4 oz (120 mL) fruit juice.  4-6 crackers.  6 chicken nuggets.  6 oz (170 g) unsweetened dry cereal.  6 oz (170 g) plain fat-free yogurt or yogurt sweetened with artificial sweeteners.  8 oz (240 mL) milk.  8 oz (170 g) fresh fruit or one small piece of fruit.  24  oz (680 g) popped popcorn. Example of carbohydrate counting Sample meal   3 oz (85 g) chicken breast.  6 oz (170 g) brown rice.  4 oz (113 g) corn.  8 oz (240 mL) milk.  8 oz (170 g) strawberries with sugar-free whipped topping. Carbohydrate calculation  1. Identify the foods that contain carbohydrates:  Rice.  Corn.  Milk.  Strawberries. 2. Calculate how many servings you have of each food:  2 servings rice.  1 serving corn.  1 serving milk.  1 serving  strawberries. 3. Multiply each number of servings by 15 g:  2 servings rice x 15 g = 30 g.  1 serving corn x 15 g = 15 g.  1 serving milk x 15 g = 15 g.  1 serving strawberries x 15 g = 15 g. 4. Add together all of the amounts to find the total grams of carbohydrates eaten:  30 g + 15 g + 15 g + 15 g = 75 g of carbohydrates total. This information is not intended to replace advice given to you by your health care provider. Make sure you discuss any questions you have with your health care provider. Document Released: 08/09/2005 Document Revised: 02/27/2016 Document Reviewed: 01/21/2016 Elsevier Interactive Patient Education  2017 [employer].

## 2016-10-22 NOTE — Progress Notes (Signed)
Patient ID: Ashley Jackson, female   DOB: 1960-12-04, 56 y.o.   MRN: [id number]   Subjective:  I acted as a Neurosurgeoncribe for Danise EdgeStacey Blyth, MD. Diamond NickelBeatrice, [place]   Patient ID: Ashley SiaMary K Ramesh, female    DOB: 1960-12-04, 56 y.o.   MRN: [id number]  Chief Complaint  Patient presents with  . Follow-up    Lab results. Was seen at an Urgent Care on 10/14/2016. Was told that her Dx was an ear infection and a viral infection.  . Hypertension  . Diabetes  . Hyperlipidemia    Hypertension  This is a chronic problem. The problem is controlled. Pertinent negatives include no blurred vision, chest pain, headaches, malaise/fatigue, palpitations or shortness of breath. Risk factors for coronary artery disease include obesity and diabetes mellitus.  Diabetes  She presents for her follow-up diabetic visit. She has type 2 diabetes mellitus. Hypoglycemia symptoms include nervousness/anxiousness. Pertinent negatives for hypoglycemia include no headaches. Pertinent negatives for diabetes include no blurred vision and no chest pain. Risk factors for coronary artery disease include obesity, hypertension and diabetes mellitus.  Hyperlipidemia  This is a chronic problem. The problem is controlled. Pertinent negatives include no chest pain or shortness of breath. Risk factors for coronary artery disease include obesity and diabetes mellitus.    Patient is in today for a follow up to discuss lab results. Was seen at an Urgent Care on 10/14/2016. Was told that her Dx was an ear infection and a viral infection. Patient also has an Hx of Type II DM, hyperlipidemia, obesity, HTN. Patient has no additional acute concerns noted at this time. No fevers or chills but still acknowledges cough productive of yellow phlegm and head congestion. She is noting increased stress due to having to move in with her father who is in early stages of dementia. She is acknowledging anhedonia but not suicidal ideation. Denies CP/palp/SOB/fevers/GI or GU c/o.  Taking meds as prescribed  Patient Care Team: Bradd CanaryStacey A Blyth, MD as PCP - General (Family Medicine)   Past Medical History:  Diagnosis Date  . Anxiety state, unspecified 07/12/2007  . COLONIC POLYPS, HX OF 05/02/2007  . DEPRESSION 07/12/2007  . DIABETES MELLITUS, TYPE II, UNCONTROLLED 02/25/2010  . EXOGENOUS OBESITY 11/07/2007  . HYPERGLYCEMIA 07/12/2007  . HYPERLIPIDEMIA 02/07/2008  . Low back pain 04/29/2013  . Overweight(278.02) 06/24/2010  . Palpitations 01/20/2016  . Preventative health care 04/29/2013  . Tobacco abuse 10/24/2012  . TOBACCO USER 06/24/2010    Past Surgical History:  Procedure Laterality Date  . COLONOSCOPY  2006    Family History  Problem Relation Age of Onset  . Liver cancer Mother   . Hypertension Other   . Hypertension Father   . Stroke Brother   . Seizures Son   . Diabetes Maternal Uncle   . Breast cancer Neg Hx   . Prostate cancer Neg Hx   . Heart disease Neg Hx     Social History   Social History  . Marital status: Married    Spouse name: N/A  . Number of children: N/A  . Years of education: N/A   Occupational History  . Not on file.   Social History Main Topics  . Smoking status: Current Every Day Smoker    Packs/day: 1.00  . Smokeless tobacco: Never Used  . Alcohol use Not on file  . Drug use: No  . Sexual activity: Not Currently   Other Topics Concern  . Not on file   Social History Narrative  .  No narrative on file    Outpatient Medications Prior to Visit  Medication Sig Dispense Refill  . ALPRAZolam (XANAX) 0.5 MG tablet Take 1 tablet (0.5 mg total) by mouth 3 (three) times daily as needed for anxiety. 90 tablet 5  . sertraline (ZOLOFT) 100 MG tablet Take 1 tablet (100 mg total) by mouth daily. (Patient not taking: Reported on 10/22/2016) 90 tablet 3   No facility-administered medications prior to visit.     Allergies  Allergen Reactions  . Levofloxacin     anxiety  . Sulfa Antibiotics     Panic attacks  . Sulfonamide  Derivatives     REACTION: Panic attacks    Review of Systems  Constitutional: Negative for fever and malaise/fatigue.  HENT: Positive for congestion.   Eyes: Negative for blurred vision.  Respiratory: Positive for cough. Negative for shortness of breath.   Cardiovascular: Negative for chest pain, palpitations and leg swelling.  Gastrointestinal: Negative for vomiting.  Musculoskeletal: Negative for back pain.  Skin: Negative for rash.  Neurological: Negative for loss of consciousness and headaches.  Psychiatric/Behavioral: Positive for depression. The patient is nervous/anxious.        Objective:    Physical Exam  Constitutional: She is oriented to person, place, and time. She appears well-developed and well-nourished. No distress.  HENT:  Head: Normocephalic and atraumatic.  Dry mucus mucus membranes  Eyes: Conjunctivae are normal.  Neck: Normal range of motion. No thyromegaly present.  Cardiovascular: Normal rate and regular rhythm.   Pulmonary/Chest: Effort normal and breath sounds normal. She has no wheezes.  Abdominal: Soft. Bowel sounds are normal. There is no tenderness.  Musculoskeletal: She exhibits no edema or deformity.  Neurological: She is alert and oriented to person, place, and time.  Skin: Skin is warm and dry. She is not diaphoretic.  Psychiatric: She has a normal mood and affect.    BP 124/68 (BP Location: Left Arm, Patient Position: Sitting, Cuff Size: Large)   Pulse 63   Temp 97.4 F (36.3 C) (Oral)   Ht 5\' 3"  (1.6 m)   Wt 177 lb 3.2 oz (80.4 kg)   SpO2 97% Comment: RA  BMI 31.39 kg/m  Wt Readings from Last 3 Encounters:  10/22/16 177 lb 3.2 oz (80.4 kg)  07/22/16 186 lb 12.8 oz (84.7 kg)  01/20/16 173 lb 2 oz (78.5 kg)     Lab Results  Component Value Date   WBC 7.5 08/17/2013   HGB 13.6 08/17/2013   HCT 40.3 08/17/2013   PLT 343 08/17/2013   GLUCOSE 139 (H) 10/19/2016   CHOL 184 10/19/2016   TRIG 171.0 (H) 10/19/2016   HDL 49.50  10/19/2016   LDLDIRECT 131.3 09/30/2010   LDLCALC 100 (H) 10/19/2016   ALT 20 10/19/2016   AST 20 10/19/2016   NA 140 10/19/2016   K 4.4 10/19/2016   CL 105 10/19/2016   CREATININE 0.92 10/19/2016   BUN 16 10/19/2016   CO2 28 10/19/2016   TSH 1.128 08/17/2013   HGBA1C 7.4 (H) 10/19/2016   MICROALBUR 0.72 10/20/2012    Lab Results  Component Value Date   TSH 1.128 08/17/2013   Lab Results  Component Value Date   WBC 7.5 08/17/2013   HGB 13.6 08/17/2013   HCT 40.3 08/17/2013   MCV 85.4 08/17/2013   PLT 343 08/17/2013   Lab Results  Component Value Date   NA 140 10/19/2016   K 4.4 10/19/2016   CO2 28 10/19/2016   GLUCOSE 139 (H)  10/19/2016   BUN 16 10/19/2016   CREATININE 0.92 10/19/2016   BILITOT 0.3 10/19/2016   ALKPHOS 60 10/19/2016   AST 20 10/19/2016   ALT 20 10/19/2016   PROT 7.3 10/19/2016   ALBUMIN 4.1 10/19/2016   CALCIUM 9.4 10/19/2016   GFR 67.25 10/19/2016   Lab Results  Component Value Date   CHOL 184 10/19/2016   Lab Results  Component Value Date   HDL 49.50 10/19/2016   Lab Results  Component Value Date   LDLCALC 100 (H) 10/19/2016   Lab Results  Component Value Date   TRIG 171.0 (H) 10/19/2016   Lab Results  Component Value Date   CHOLHDL 4 10/19/2016   Lab Results  Component Value Date   HGBA1C 7.4 (H) 10/19/2016       Assessment & Plan:   Problem List Items Addressed This Visit    Diabetes mellitus type 2 in obese (HCC)    hgba1c improving, minimize simple carbs. Increase exercise as tolerated.       Relevant Orders   Hemoglobin A1c   Obesity    Encouraged DASH diet, decrease po intake and increase exercise as tolerated. Needs 7-8 hours of sleep nightly. Avoid trans fats, eat small, frequent meals every 4-5 hours with lean proteins, complex carbs and healthy fats. Minimize simple carbs. She has been eating less due to stress.       TOBACCO USER    Encouraged complete cessation. Discussed need to quit as relates to  risk of numerous cancers, cardiac and pulmonary disease as well as neurologic complications. Counseled for greater than 3 minutes. Still smoking with illness. Declines to try cessation      Depression with anxiety    Has noted a big increase in stress since nshe had to move in with her father who is in the early stages of Alzheimer's and she is struggling with lose of privacy and independence. No ability to have a social life or visit with friends. She continues on Sertraline and Alprazolam      Relevant Medications   ALPRAZolam (XANAX) 0.5 MG tablet   Palpitations   Relevant Medications   ALPRAZolam (XANAX) 0.5 MG tablet      I have discontinued Ms. Mcvicar's sertraline. I have also changed her sertraline. Additionally, I am having her start on guaiFENesin-codeine. Lastly, I am having her maintain her ALPRAZolam.  Meds ordered this encounter  Medications  . ALPRAZolam (XANAX) 0.5 MG tablet    Sig: Take 1 tablet (0.5 mg total) by mouth 3 (three) times daily as needed for anxiety.    Dispense:  90 tablet    Refill:  5  . DISCONTD: sertraline (ZOLOFT) 50 MG tablet    Sig: Take 50 mg by mouth daily.  . sertraline (ZOLOFT) 50 MG tablet    Sig: Take 1 tablet (50 mg total) by mouth daily.    Dispense:  30 tablet    Refill:  3  . guaiFENesin-codeine (VIRTUSSIN A/C) 100-10 MG/5ML syrup    Sig: Take 5 mLs by mouth 3 (three) times daily as needed for cough.    Dispense:  120 mL    Refill:  0    CMA served as scribe during this visit. History, Physical and Plan performed by medical provider. Documentation and orders reviewed and attested to.  Danise Edge, MD

## 2016-10-22 NOTE — Assessment & Plan Note (Signed)
Encouraged heart healthy diet, increase exercise, avoid trans fats, consider a krill oil cap daily 

## 2016-10-22 NOTE — Assessment & Plan Note (Signed)
hgba1c improving, minimize simple carbs. Increase exercise as tolerated.

## 2016-11-01 MED FILL — AMOXICILLIN 500 MG CAPSULE: 500 | 6 days supply | Qty: 24 | Fill #0

## 2016-11-30 ENCOUNTER — Encounter: Payer: Self-pay | Admitting: Family Medicine

## 2016-11-30 DIAGNOSIS — R002 Palpitations: Secondary | ICD-10-CM

## 2016-11-30 DIAGNOSIS — F418 Other specified anxiety disorders: Secondary | ICD-10-CM

## 2016-11-30 MED FILL — AMOXICILLIN 500 MG CAPSULE: 500 | 6 days supply | Qty: 24 | Fill #1

## 2016-11-30 MED FILL — ALPRAZolam 0.5 MG TABS: 0.5 | 30 days supply | Qty: 90 | Fill #1

## 2016-12-02 ENCOUNTER — Encounter: Payer: Self-pay | Admitting: Family Medicine

## 2016-12-02 MED ORDER — ALPRAZOLAM 0.5 MG PO TABS: 0.5000 mg | ORAL_TABLET | Freq: Every day | ORAL | 2 refills | Status: DC | PRN

## 2016-12-03 ENCOUNTER — Other Ambulatory Visit: Payer: Self-pay | Admitting: Family Medicine

## 2016-12-03 MED ORDER — SERTRALINE HCL 50 MG PO TABS: 50.0000 mg | ORAL_TABLET | Freq: Every day | ORAL | 3 refills | Status: DC

## 2016-12-09 ENCOUNTER — Encounter: Payer: Self-pay | Admitting: Family Medicine

## 2016-12-19 ENCOUNTER — Encounter: Payer: Self-pay | Admitting: Family Medicine

## 2016-12-20 ENCOUNTER — Other Ambulatory Visit: Payer: Self-pay | Admitting: Family Medicine

## 2016-12-20 MED ORDER — SERTRALINE HCL 50 MG PO TABS: 50.0000 mg | ORAL_TABLET | Freq: Every day | ORAL | 3 refills | Status: DC

## 2016-12-20 MED FILL — SERTRALINE HCL 50 MG TABLET: 50 | 30 days supply | Qty: 30 | Fill #0

## 2017-01-24 ENCOUNTER — Ambulatory Visit (INDEPENDENT_AMBULATORY_CARE_PROVIDER_SITE_OTHER): Payer: BLUE CROSS/BLUE SHIELD | Admitting: Family Medicine

## 2017-01-24 ENCOUNTER — Encounter: Payer: Self-pay | Admitting: Family Medicine

## 2017-01-24 VITALS — BP 132/78 | HR 78 | Temp 98.4°F | Resp 18 | Wt 178.0 lb

## 2017-01-24 DIAGNOSIS — T7840XD Allergy, unspecified, subsequent encounter: Secondary | ICD-10-CM | POA: Diagnosis not present

## 2017-01-24 DIAGNOSIS — F418 Other specified anxiety disorders: Secondary | ICD-10-CM | POA: Diagnosis not present

## 2017-01-24 DIAGNOSIS — E782 Mixed hyperlipidemia: Secondary | ICD-10-CM | POA: Diagnosis not present

## 2017-01-24 DIAGNOSIS — E669 Obesity, unspecified: Secondary | ICD-10-CM

## 2017-01-24 DIAGNOSIS — F17200 Nicotine dependence, unspecified, uncomplicated: Secondary | ICD-10-CM | POA: Diagnosis not present

## 2017-01-24 DIAGNOSIS — R002 Palpitations: Secondary | ICD-10-CM | POA: Diagnosis not present

## 2017-01-24 DIAGNOSIS — T7840XA Allergy, unspecified, initial encounter: Secondary | ICD-10-CM | POA: Insufficient documentation

## 2017-01-24 DIAGNOSIS — E1169 Type 2 diabetes mellitus with other specified complication: Secondary | ICD-10-CM | POA: Diagnosis not present

## 2017-01-24 HISTORY — DX: Allergy, unspecified, initial encounter: T78.40XA

## 2017-01-24 LAB — COMPREHENSIVE METABOLIC PANEL
ALT: 16 U/L (ref 0–35)
AST: 16 U/L (ref 0–37)
Albumin: 4.1 g/dL (ref 3.5–5.2)
Alkaline Phosphatase: 57 U/L (ref 39–117)
BUN: 15 mg/dL (ref 6–23)
CALCIUM: 9.4 mg/dL (ref 8.4–10.5)
CO2: 29 meq/L (ref 19–32)
CREATININE: 1.06 mg/dL (ref 0.40–1.20)
Chloride: 103 mEq/L (ref 96–112)
GFR: 57.06 mL/min — ABNORMAL LOW (ref >60.00)
GLUCOSE: 258 mg/dL — AB (ref 70–99)
Potassium: 4.2 mEq/L (ref 3.5–5.1)
Sodium: 137 mEq/L (ref 135–145)
Total Bilirubin: 0.4 mg/dL (ref 0.2–1.2)
Total Protein: 7.1 g/dL (ref 6.0–8.3)

## 2017-01-24 LAB — CBC
HCT: 44.4 % (ref 36.0–46.0)
Hemoglobin: 15 g/dL (ref 12.0–15.0)
MCHC: 33.7 g/dL (ref 30.0–36.0)
MCV: 91.7 fl (ref 78.0–100.0)
Platelets: 281 10*3/uL (ref 150.0–400.0)
RBC: 4.84 Mil/uL (ref 3.87–5.11)
RDW: 12.6 % (ref 11.5–15.5)
WBC: 6.7 10*3/uL (ref 4.0–10.5)

## 2017-01-24 LAB — TSH: TSH: 1.31 u[IU]/mL (ref 0.35–4.50)

## 2017-01-24 LAB — HEMOGLOBIN A1C: HEMOGLOBIN A1C: 8.2 % — AB (ref 4.6–6.5)

## 2017-01-24 MED ORDER — ALPRAZOLAM 0.5 MG PO TABS: 0.5000 mg | ORAL_TABLET | Freq: Every day | ORAL | 2 refills | Status: DC | PRN

## 2017-01-24 MED FILL — ALPRAZolam 0.5 MG TABS: 0.5 | 30 days supply | Qty: 30 | Fill #0

## 2017-01-24 NOTE — Progress Notes (Signed)
Subjective:  I acted as a Neurosurgeonscribe for Dr. Abner GreenspanBlyth. [place], [place]  Patient ID: Ashley Jackson Staff, female    DOB: [birth year], 56 y.o.   MRN: [id number]  Chief Complaint  Patient presents with  . Follow-up    HPI  Patient is in today for a 3 month follow up. She is following up on her Diabetes, hyperlipidemia and other medical concerns. Patient has no acute concerns. Patient states she is trying to quit smoking, she is also titrate on her zoloft and Xanax. She will come in for a 3 month visit to see how she is doing off the medication.  No recent febrile illness or acute hospitalizations. Denies CP/palp/SOB/HA/congestion/fevers/GI or GU c/o. Taking meds as prescribed    Patient Care Team: Bradd CanaryBlyth, Zaydenn Balaguer A, MD as PCP - General (Family Medicine)   Past Medical History:  Diagnosis Date  . Allergic state 01/24/2017  . Anxiety state, unspecified 07/12/2007  . COLONIC POLYPS, HX OF 05/02/2007  . DEPRESSION 07/12/2007  . DIABETES MELLITUS, TYPE II, UNCONTROLLED 02/25/2010  . EXOGENOUS OBESITY 11/07/2007  . HYPERGLYCEMIA 07/12/2007  . HYPERLIPIDEMIA 02/07/2008  . Low back pain 04/29/2013  . Overweight(278.02) 06/24/2010  . Palpitations 01/20/2016  . Preventative health care 04/29/2013  . Tobacco abuse 10/24/2012  . TOBACCO USER 06/24/2010    Past Surgical History:  Procedure Laterality Date  . COLONOSCOPY  2006    Family History  Problem Relation Age of Onset  . Liver cancer Mother   . Hypertension Other   . Hypertension Father   . Stroke Brother   . Seizures Son   . Diabetes Maternal Uncle   . Breast cancer Neg Hx   . Prostate cancer Neg Hx   . Heart disease Neg Hx     Social History   Social History  . Marital status: Married    Spouse name: N/A  . Number of children: N/A  . Years of education: N/A   Occupational History  . Not on file.   Social History Main Topics  . Smoking status: Current Every Day Smoker    Packs/day: 1.00  . Smokeless tobacco: Never Used  . Alcohol use Not on  file  . Drug use: No  . Sexual activity: Not Currently   Other Topics Concern  . Not on file   Social History Narrative  . No narrative on file    Outpatient Medications Prior to Visit  Medication Sig Dispense Refill  . guaiFENesin-codeine (VIRTUSSIN A/C) 100-10 MG/5ML syrup Take 5 mLs by mouth 3 (three) times daily as needed for cough. 120 mL 0  . ALPRAZolam (XANAX) 0.5 MG tablet Take 1 tablet (0.5 mg total) by mouth daily as needed for anxiety. 20 tablet 2  . sertraline (ZOLOFT) 50 MG tablet Take 1 tablet (50 mg total) by mouth daily. (Patient taking differently: Take 25 mg by mouth daily. ) 30 tablet 3   No facility-administered medications prior to visit.     Allergies  Allergen Reactions  . Levofloxacin     anxiety  . Sulfa Antibiotics     Panic attacks  . Sulfonamide Derivatives     REACTION: Panic attacks    Review of Systems  Constitutional: Positive for malaise/fatigue. Negative for fever.  HENT: Negative for congestion.   Eyes: Negative for blurred vision.  Respiratory: Negative for cough and shortness of breath.   Cardiovascular: Negative for chest pain, palpitations and leg swelling.  Gastrointestinal: Negative for vomiting.  Musculoskeletal: Negative for back pain.  Skin:  Negative for rash.  Neurological: Negative for loss of consciousness and headaches.  Psychiatric/Behavioral: Positive for depression. The patient is nervous/anxious and has insomnia.        Objective:    Physical Exam  Constitutional: She is oriented to person, place, and time. She appears well-developed and well-nourished. No distress.  HENT:  Head: Normocephalic and atraumatic.  Eyes: Conjunctivae are normal.  Neck: Normal range of motion. No thyromegaly present.  Cardiovascular: Normal rate and regular rhythm.   Pulmonary/Chest: Effort normal and breath sounds normal. She has no wheezes.  Abdominal: Soft. Bowel sounds are normal. There is no tenderness.  Musculoskeletal: Normal  range of motion. She exhibits no edema or deformity.  Neurological: She is alert and oriented to person, place, and time.  Skin: Skin is warm and dry. She is not diaphoretic.  Psychiatric: She has a normal mood and affect.    BP 132/78 (BP Location: Left Arm, Patient Position: Sitting, Cuff Size: Normal)   Pulse 78   Temp 98.4 F (36.9 C) (Oral)   Resp 18   Wt 178 lb (80.7 kg)   SpO2 98%   BMI 31.53 kg/m  Wt Readings from Last 3 Encounters:  01/24/17 178 lb (80.7 kg)  10/22/16 177 lb 3.2 oz (80.4 kg)  07/22/16 186 lb 12.8 oz (84.7 kg)   BP Readings from Last 3 Encounters:  01/24/17 132/78  10/22/16 124/68  07/22/16 128/86     Immunization History  Administered Date(s) Administered  . Td 08/23/1998  . Tdap 11/13/2012    Health Maintenance  Topic Date Due  . PNEUMOCOCCAL POLYSACCHARIDE VACCINE (1) 04/26/2017 (Originally 05[birth year]14/1963)  . FOOT EXAM  02/09/2018 (Originally 05/14/1971)  . OPHTHALMOLOGY EXAM  02/09/2018 (Originally 05/14/1971)  . MAMMOGRAM  02/13/2018 (Originally 08/31/2012)  . PAP SMEAR  02/13/2018 (Originally 08/31/2014)  . URINE MICROALBUMIN  02/13/2018 (Originally 10/20/2013)  . COLONOSCOPY  02/13/2018 (Originally 12/01/2014)  . Hepatitis C Screening  02/13/2018 (Originally 24-May-1961)  . HIV Screening  02/13/2018 (Originally 05/13/1976)  . INFLUENZA VACCINE  03/23/2017  . HEMOGLOBIN A1C  07/26/2017  . TETANUS/TDAP  11/14/2022    Lab Results  Component Value Date   WBC 6.7 01/24/2017   HGB 15.0 01/24/2017   HCT 44.4 01/24/2017   PLT 281.0 01/24/2017   GLUCOSE 258 (H) 01/24/2017   CHOL 184 10/19/2016   TRIG 171.0 (H) 10/19/2016   HDL 49.50 10/19/2016   LDLDIRECT 131.3 09/30/2010   LDLCALC 100 (H) 10/19/2016   ALT 16 01/24/2017   AST 16 01/24/2017   NA 137 01/24/2017   Jackson 4.2 01/24/2017   CL 103 01/24/2017   CREATININE 1.06 01/24/2017   BUN 15 01/24/2017   CO2 29 01/24/2017   TSH 1.31 01/24/2017   HGBA1C 8.2 (H) 01/24/2017   MICROALBUR 0.72  10/20/2012    Lab Results  Component Value Date   TSH 1.31 01/24/2017   Lab Results  Component Value Date   WBC 6.7 01/24/2017   HGB 15.0 01/24/2017   HCT 44.4 01/24/2017   MCV 91.7 01/24/2017   PLT 281.0 01/24/2017   Lab Results  Component Value Date   NA 137 01/24/2017   Jackson 4.2 01/24/2017   CO2 29 01/24/2017   GLUCOSE 258 (H) 01/24/2017   BUN 15 01/24/2017   CREATININE 1.06 01/24/2017   BILITOT 0.4 01/24/2017   ALKPHOS 57 01/24/2017   AST 16 01/24/2017   ALT 16 01/24/2017   PROT 7.1 01/24/2017   ALBUMIN 4.1 01/24/2017   CALCIUM 9.4 01/24/2017  GFR 57.06 (L) 01/24/2017   Lab Results  Component Value Date   CHOL 184 10/19/2016   Lab Results  Component Value Date   HDL 49.50 10/19/2016   Lab Results  Component Value Date   LDLCALC 100 (H) 10/19/2016   Lab Results  Component Value Date   TRIG 171.0 (H) 10/19/2016   Lab Results  Component Value Date   CHOLHDL 4 10/19/2016   Lab Results  Component Value Date   HGBA1C 8.2 (H) 01/24/2017         Assessment & Plan:   Problem List Items Addressed This Visit    Diabetes mellitus type 2 in obese (HCC) - Primary    hgba1c acceptable, minimize simple carbs. Increase exercise as tolerated. Continue current meds      Relevant Orders   CBC (Completed)   Comprehensive metabolic panel (Completed)   TSH (Completed)   Hemoglobin A1c (Completed)   Hyperlipidemia, mixed    Encouraged heart healthy diet, increase exercise, avoid trans fats, consider a krill oil cap daily      Relevant Orders   Lipid panel   Obesity    Encouraged DASH diet, decrease po intake and increase exercise as tolerated. Needs 7-8 hours of sleep nightly. Avoid trans fats, eat small, frequent meals every 4-5 hours with lean proteins, complex carbs and healthy fats. Minimize simple carbs, bariatric referral      TOBACCO USER    Encouraged complete cessation. Discussed need to quit as relates to risk of numerous cancers, cardiac and  pulmonary disease as well as neurologic complications. Counseled for greater than 3 minutes. Work and caring for her father with Alzheimer's is really stressing her. She is currently smoking 1 to 1.5 ppd.       Depression with anxiety   Relevant Medications   ALPRAZolam (XANAX) 0.5 MG tablet   Palpitations   Relevant Medications   ALPRAZolam (XANAX) 0.5 MG tablet   Allergic state    Has noted some increased congestion, is allowed to take Zyrtec and Mucinex bid         I have discontinued Ms. Lance's sertraline. I am also having her maintain her guaiFENesin-codeine and ALPRAZolam.  Meds ordered this encounter  Medications  . ALPRAZolam (XANAX) 0.5 MG tablet    Sig: Take 1 tablet (0.5 mg total) by mouth daily as needed for anxiety.    Dispense:  30 tablet    Refill:  2    CMA served as scribe during this visit. History, Physical and Plan performed by medical provider. Documentation and orders reviewed and attested to.  Danise Edge, MD

## 2017-01-24 NOTE — Assessment & Plan Note (Signed)
Has noted some increased congestion, is allowed to take Zyrtec and Mucinex bid

## 2017-01-24 NOTE — Assessment & Plan Note (Signed)
Encouraged heart healthy diet, increase exercise, avoid trans fats, consider a krill oil cap daily 

## 2017-01-24 NOTE — Patient Instructions (Signed)

## 2017-01-24 NOTE — Assessment & Plan Note (Signed)
Encouraged DASH diet, decrease po intake and increase exercise as tolerated. Needs 7-8 hours of sleep nightly. Avoid trans fats, eat small, frequent meals every 4-5 hours with lean proteins, complex carbs and healthy fats. Minimize simple carbs, bariatric referral 

## 2017-01-24 NOTE — Assessment & Plan Note (Signed)
Encouraged complete cessation. Discussed need to quit as relates to risk of numerous cancers, cardiac and pulmonary disease as well as neurologic complications. Counseled for greater than 3 minutes. Work and caring for her father with Alzheimer's is really stressing her. She is currently smoking 1 to 1.5 ppd.

## 2017-01-24 NOTE — Assessment & Plan Note (Signed)
hgba1c acceptable, minimize simple carbs. Increase exercise as tolerated. Continue current meds 

## 2017-01-25 ENCOUNTER — Encounter: Payer: BLUE CROSS/BLUE SHIELD | Admitting: Family Medicine

## 2017-01-25 ENCOUNTER — Encounter: Payer: Self-pay | Admitting: Family Medicine

## 2017-01-27 ENCOUNTER — Other Ambulatory Visit: Payer: BLUE CROSS/BLUE SHIELD

## 2017-02-03 ENCOUNTER — Other Ambulatory Visit: Payer: BLUE CROSS/BLUE SHIELD

## 2017-03-08 MED FILL — ALPRAZolam 0.5 MG TABS: 0.5 | 30 days supply | Qty: 30 | Fill #1

## 2017-04-26 ENCOUNTER — Telehealth: Payer: Self-pay | Admitting: Family Medicine

## 2017-04-26 NOTE — Telephone Encounter (Signed)
Relation to ZO:XWRUpt:self Call back number:[phone]   Reason for call:  Patient scheduled 3 month follow up for 06/03/17 with PCP, patient inquiring if pre visit labs are necessary prior to appointment, please respond via My Chart

## 2017-04-28 ENCOUNTER — Ambulatory Visit: Payer: BLUE CROSS/BLUE SHIELD | Admitting: Family Medicine

## 2017-05-06 MED FILL — ALPRAZolam 0.5 MG TABS: 0.5 | 30 days supply | Qty: 30 | Fill #2

## 2017-05-20 ENCOUNTER — Encounter: Payer: Self-pay | Admitting: Family Medicine

## 2017-05-20 DIAGNOSIS — E1169 Type 2 diabetes mellitus with other specified complication: Secondary | ICD-10-CM

## 2017-05-24 ENCOUNTER — Other Ambulatory Visit: Payer: Self-pay | Admitting: Family Medicine

## 2017-05-24 DIAGNOSIS — R319 Hematuria, unspecified: Secondary | ICD-10-CM

## 2017-05-24 NOTE — Telephone Encounter (Signed)
Patient checking on status, ph [phone]

## 2017-05-30 ENCOUNTER — Other Ambulatory Visit (INDEPENDENT_AMBULATORY_CARE_PROVIDER_SITE_OTHER): Payer: BLUE CROSS/BLUE SHIELD

## 2017-05-30 DIAGNOSIS — E1169 Type 2 diabetes mellitus with other specified complication: Secondary | ICD-10-CM

## 2017-05-30 DIAGNOSIS — R319 Hematuria, unspecified: Secondary | ICD-10-CM

## 2017-05-30 LAB — URINALYSIS, ROUTINE W REFLEX MICROSCOPIC
Bilirubin Urine: NEGATIVE
KETONES UR: NEGATIVE
LEUKOCYTES UA: NEGATIVE
Nitrite: NEGATIVE
PH: 5.5 (ref 5.0–8.0)
Total Protein, Urine: NEGATIVE
URINE GLUCOSE: 250 — AB
UROBILINOGEN UA: 0.2 (ref 0.0–1.0)

## 2017-05-30 LAB — HEMOGLOBIN A1C: HEMOGLOBIN A1C: 7.7 % — AB (ref 4.6–6.5)

## 2017-05-31 LAB — URINE CULTURE
MICRO NUMBER: 81117306
RESULT: NO GROWTH
SPECIMEN QUALITY: ADEQUATE

## 2017-06-03 ENCOUNTER — Encounter: Payer: Self-pay | Admitting: Family Medicine

## 2017-06-03 ENCOUNTER — Ambulatory Visit (INDEPENDENT_AMBULATORY_CARE_PROVIDER_SITE_OTHER): Payer: BLUE CROSS/BLUE SHIELD | Admitting: Family Medicine

## 2017-06-03 VITALS — BP 130/80 | HR 67 | Temp 97.7°F | Resp 18 | Wt 174.6 lb

## 2017-06-03 DIAGNOSIS — E1169 Type 2 diabetes mellitus with other specified complication: Secondary | ICD-10-CM

## 2017-06-03 DIAGNOSIS — R319 Hematuria, unspecified: Secondary | ICD-10-CM | POA: Diagnosis not present

## 2017-06-03 DIAGNOSIS — E782 Mixed hyperlipidemia: Secondary | ICD-10-CM

## 2017-06-03 DIAGNOSIS — E669 Obesity, unspecified: Secondary | ICD-10-CM

## 2017-06-03 DIAGNOSIS — F17200 Nicotine dependence, unspecified, uncomplicated: Secondary | ICD-10-CM | POA: Diagnosis not present

## 2017-06-03 DIAGNOSIS — R002 Palpitations: Secondary | ICD-10-CM

## 2017-06-03 DIAGNOSIS — Z79899 Other long term (current) drug therapy: Secondary | ICD-10-CM

## 2017-06-03 DIAGNOSIS — F418 Other specified anxiety disorders: Secondary | ICD-10-CM

## 2017-06-03 HISTORY — DX: Hematuria, unspecified: R31.9

## 2017-06-03 LAB — COMPREHENSIVE METABOLIC PANEL
ALT: 22 U/L (ref 0–35)
AST: 19 U/L (ref 0–37)
Albumin: 4.3 g/dL (ref 3.5–5.2)
Alkaline Phosphatase: 56 U/L (ref 39–117)
BUN: 13 mg/dL (ref 6–23)
CHLORIDE: 103 meq/L (ref 96–112)
CO2: 27 meq/L (ref 19–32)
CREATININE: 0.84 mg/dL (ref 0.40–1.20)
Calcium: 8.9 mg/dL (ref 8.4–10.5)
GFR: 74.53 mL/min (ref >60.00)
GLUCOSE: 149 mg/dL — AB (ref 70–99)
Potassium: 4.3 mEq/L (ref 3.5–5.1)
SODIUM: 140 meq/L (ref 135–145)
Total Bilirubin: 0.5 mg/dL (ref 0.2–1.2)
Total Protein: 7.4 g/dL (ref 6.0–8.3)

## 2017-06-03 LAB — URINALYSIS, ROUTINE W REFLEX MICROSCOPIC
BILIRUBIN URINE: NEGATIVE
Ketones, ur: NEGATIVE
Leukocytes, UA: NEGATIVE
Nitrite: NEGATIVE
Specific Gravity, Urine: >=1.03 — AB (ref 1.000–1.030)
TOTAL PROTEIN, URINE-UPE24: NEGATIVE
Urine Glucose: NEGATIVE
Urobilinogen, UA: 0.2 (ref 0.0–1.0)
pH: 5.5 (ref 5.0–8.0)

## 2017-06-03 MED ORDER — ALPRAZOLAM 0.5 MG PO TABS: 0.5000 mg | ORAL_TABLET | Freq: Every day | ORAL | 5 refills | Status: DC | PRN

## 2017-06-03 NOTE — Assessment & Plan Note (Signed)
Encouraged complete cessation. Discussed need to quit as relates to risk of numerous cancers, cardiac and pulmonary disease as well as neurologic complications. Counseled for greater than 3 minutes 

## 2017-06-03 NOTE — Assessment & Plan Note (Addendum)
Very stressed secondary to quitting her job because her job asked her to. Dispense meds unprescribed. 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 Declines SSRI despite long conversation. She takes Alprazolam sparingly. Allowed refills and UDS and contract done today

## 2017-06-03 NOTE — Assessment & Plan Note (Signed)
hgba1c acceptable, minimize simple carbs. Increase exercise as tolerated. Continue current meds. improving

## 2017-06-03 NOTE — Progress Notes (Signed)
Subjective:  I acted as a Neurosurgeon for Dr. Abner Greenspan. [place], [place]  Patient ID: Ashley Jackson, female    DOB: 1960-10-07, 56 y.o.   MRN: [id number]  No chief complaint on file.   HPI  Patient is in today for a 3 month follow up and very stressed, quit her job, is caring for her father with dementia and more. Continues to smoke. No recent febrile illness or hospitalization. They found blood in her urine at work. No dysuria or frequency. Denies CP/SOB/HA/congestion/fevers/GI or GU c/o. Taking meds as prescribed. Notes a fluttering in her throat that lasts seconds and resolves. No associated symptoms.  Patient Care Team: Bradd Canary, MD as PCP - General (Family Medicine)   Past Medical History:  Diagnosis Date  . Allergic state 01/24/2017  . Anxiety state, unspecified 07/12/2007  . COLONIC POLYPS, HX OF 05/02/2007  . DEPRESSION 07/12/2007  . DIABETES MELLITUS, TYPE II, UNCONTROLLED 02/25/2010  . EXOGENOUS OBESITY 11/07/2007  . Hematuria 06/03/2017  . HYPERGLYCEMIA 07/12/2007  . HYPERLIPIDEMIA 02/07/2008  . Low back pain 04/29/2013  . Overweight(278.02) 06/24/2010  . Palpitations 01/20/2016  . Preventative health care 04/29/2013  . Tobacco abuse 10/24/2012  . TOBACCO USER 06/24/2010    Past Surgical History:  Procedure Laterality Date  . COLONOSCOPY  2006    Family History  Problem Relation Age of Onset  . Liver cancer Mother   . Hypertension Other   . Hypertension Father   . Stroke Brother   . Seizures Son   . Diabetes Maternal Uncle   . Breast cancer Neg Hx   . Prostate cancer Neg Hx   . Heart disease Neg Hx     Social History   Social History  . Marital status: Married    Spouse name: N/A  . Number of children: N/A  . Years of education: N/A   Occupational History  . Not on file.   Social History Main Topics  . Smoking status: Current Every Day Smoker    Packs/day: 1.00  . Smokeless tobacco: Never Used  . Alcohol use Not on file  . Drug use: No  . Sexual activity:  Not Currently   Other Topics Concern  . Not on file   Social History Narrative  . No narrative on file    Outpatient Medications Prior to Visit  Medication Sig Dispense Refill  . guaiFENesin-codeine (VIRTUSSIN A/C) 100-10 MG/5ML syrup Take 5 mLs by mouth 3 (three) times daily as needed for cough. 120 mL 0  . ALPRAZolam (XANAX) 0.5 MG tablet Take 1 tablet (0.5 mg total) by mouth daily as needed for anxiety. 30 tablet 2   No facility-administered medications prior to visit.     Allergies  Allergen Reactions  . Levofloxacin     anxiety  . Sulfa Antibiotics     Panic attacks  . Sulfonamide Derivatives     REACTION: Panic attacks    Review of Systems  Constitutional: Positive for malaise/fatigue. Negative for fever.  HENT: Negative for congestion.   Eyes: Negative for blurred vision.  Respiratory: Negative for cough and shortness of breath.   Cardiovascular: Positive for palpitations. Negative for chest pain and leg swelling.  Gastrointestinal: Negative for vomiting.  Musculoskeletal: Negative for back pain.  Skin: Negative for rash.  Neurological: Negative for loss of consciousness and headaches.  Psychiatric/Behavioral: Positive for depression. Negative for suicidal ideas. The patient is nervous/anxious.        Objective:    Physical Exam  Constitutional:  She is oriented to person, place, and time. She appears well-developed and well-nourished. No distress.  HENT:  Head: Normocephalic and atraumatic.  Eyes: Conjunctivae are normal.  Neck: Normal range of motion. No thyromegaly present.  Cardiovascular: Normal rate and regular rhythm.   Pulmonary/Chest: Effort normal and breath sounds normal. She has no wheezes.  Abdominal: Soft. Bowel sounds are normal. There is no tenderness.  Musculoskeletal: Normal range of motion. She exhibits no edema or deformity.  Neurological: She is alert and oriented to person, place, and time.  Skin: Skin is warm and dry. She is not  diaphoretic.  Psychiatric: She has a normal mood and affect.    BP 130/80 (BP Location: Left Arm, Patient Position: Sitting, Cuff Size: Normal)   Pulse 67   Temp 97.7 F (36.5 C) (Oral)   Resp 18   Wt 174 lb 9.6 oz (79.2 kg)   SpO2 99%   BMI 30.93 kg/m  Wt Readings from Last 3 Encounters:  06/03/17 174 lb 9.6 oz (79.2 kg)  01/24/17 178 lb (80.7 kg)  10/22/16 177 lb 3.2 oz (80.4 kg)   BP Readings from Last 3 Encounters:  06/03/17 130/80  01/24/17 132/78  10/22/16 124/68     Immunization History  Administered Date(s) Administered  . Td 08/23/1998  . Tdap 11/13/2012    Health Maintenance  Topic Date Due  . PNEUMOCOCCAL POLYSACCHARIDE VACCINE (1) 05/14/1963  . INFLUENZA VACCINE  11/20/2017 (Originally 03/23/2017)  . FOOT EXAM  02/09/2018 (Originally 05/14/1971)  . OPHTHALMOLOGY EXAM  02/09/2018 (Originally 05/14/1971)  . MAMMOGRAM  02/13/2018 (Originally 08/31/2012)  . PAP SMEAR  02/13/2018 (Originally 08/31/2014)  . URINE MICROALBUMIN  02/13/2018 (Originally 10/20/2013)  . COLONOSCOPY  02/13/2018 (Originally 12/01/2014)  . Hepatitis C Screening  02/13/2018 (Originally [birth year])  . HIV Screening  02/13/2018 (Originally 05/13/1976)  . HEMOGLOBIN A1C  11/28/2017  . TETANUS/TDAP  11/14/2022    Lab Results  Component Value Date   WBC 6.7 01/24/2017   HGB 15.0 01/24/2017   HCT 44.4 01/24/2017   PLT 281.0 01/24/2017   GLUCOSE 258 (H) 01/24/2017   CHOL 184 10/19/2016   TRIG 171.0 (H) 10/19/2016   HDL 49.50 10/19/2016   LDLDIRECT 131.3 09/30/2010   LDLCALC 100 (H) 10/19/2016   ALT 16 01/24/2017   AST 16 01/24/2017   NA 137 01/24/2017   K 4.2 01/24/2017   CL 103 01/24/2017   CREATININE 1.06 01/24/2017   BUN 15 01/24/2017   CO2 29 01/24/2017   TSH 1.31 01/24/2017   HGBA1C 7.7 (H) 05/30/2017   MICROALBUR 0.72 10/20/2012    Lab Results  Component Value Date   TSH 1.31 01/24/2017   Lab Results  Component Value Date   WBC 6.7 01/24/2017   HGB 15.0 01/24/2017   HCT  44.4 01/24/2017   MCV 91.7 01/24/2017   PLT 281.0 01/24/2017   Lab Results  Component Value Date   NA 137 01/24/2017   K 4.2 01/24/2017   CO2 29 01/24/2017   GLUCOSE 258 (H) 01/24/2017   BUN 15 01/24/2017   CREATININE 1.06 01/24/2017   BILITOT 0.4 01/24/2017   ALKPHOS 57 01/24/2017   AST 16 01/24/2017   ALT 16 01/24/2017   PROT 7.1 01/24/2017   ALBUMIN 4.1 01/24/2017   CALCIUM 9.4 01/24/2017   GFR 57.06 (L) 01/24/2017   Lab Results  Component Value Date   CHOL 184 10/19/2016   Lab Results  Component Value Date   HDL 49.50 10/19/2016   Lab Results  Component Value Date   LDLCALC 100 (H) 10/19/2016   Lab Results  Component Value Date   TRIG 171.0 (H) 10/19/2016   Lab Results  Component Value Date   CHOLHDL 4 10/19/2016   Lab Results  Component Value Date   HGBA1C 7.7 (H) 05/30/2017         Assessment & Plan:   Problem List Items Addressed This Visit    Diabetes mellitus type 2 in obese (HCC)    hgba1c acceptable, minimize simple carbs. Increase exercise as tolerated. Continue current meds. improving      Hyperlipidemia, mixed    Declines test      TOBACCO USER    Encouraged complete cessation. Discussed need to quit as relates to risk of numerous cancers, cardiac and pulmonary disease as well as neurologic complications. Counseled for greater than 3 minutes      Depression with anxiety    Very stressed secondary to quitting her job because her job asked her to. Dispense meds unprescribed. 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 Declines SSRI despite long conversation. She takes Alprazolam sparingly. Allowed refills and UDS and contract done today      Relevant Medications   ALPRAZolam (XANAX) 0.5 MG tablet   Palpitations    Occasional without associated symptoms. Will avoid caffeine and report worse symptoms      Relevant Medications   ALPRAZolam (XANAX) 0.5 MG tablet   Other Relevant Orders   Comprehensive  metabolic panel   Hematuria    Found at work in a screen, repeat on 10/8 persistent without infection with moderate blood. She will run out of insurance soon. Repeat UA today, renal ultrasound and referred to urology. Advised again to quit smoking as it is her greatest risk factor for concerning disease.      Relevant Orders   US Renal   Urinalysis   Ambulatory referral to Urology    Other Visit Diagnoses    High risk medication use    -  Primary   Relevant Orders   Pain Mgmt, Profile 8 w/Conf, U      I am having Ms. Hadsall maintain her guaiFENesin-codeine and ALPRAZolam.  Meds ordered this encounter  Medications  . ALPRAZolam (XANAX) 0.5 MG tablet    Sig: Take 1 tablet (0.5 mg total) by mouth daily as needed for anxiety.    Dispense:  30 tablet    Refill:  5    CMA served as scribe during this visit. History, Physical and Plan performed by medical provider. Documentation and orders reviewed and attested to.  Danise Edge, MD

## 2017-06-03 NOTE — Patient Instructions (Signed)

## 2017-06-03 NOTE — Assessment & Plan Note (Signed)
Occasional without associated symptoms. Will avoid caffeine and report worse symptoms

## 2017-06-03 NOTE — Assessment & Plan Note (Signed)
Found at work in a screen, repeat on 10/8 persistent without infection with moderate blood. She will run out of insurance soon. Repeat UA today, renal ultrasound and referred to urology. Advised again to quit smoking as it is her greatest risk factor for concerning disease.

## 2017-06-03 NOTE — Assessment & Plan Note (Signed)
Declines test

## 2017-06-06 ENCOUNTER — Encounter: Payer: Self-pay | Admitting: Family Medicine

## 2017-06-06 ENCOUNTER — Ambulatory Visit (HOSPITAL_BASED_OUTPATIENT_CLINIC_OR_DEPARTMENT_OTHER)
Admission: RE | Admit: 2017-06-06 | Discharge: 2017-06-06 | Disposition: A | Discharge status: Home / Self Care | Payer: BLUE CROSS/BLUE SHIELD | Source: Ambulatory Visit | Attending: Family Medicine | Admitting: Family Medicine

## 2017-06-06 DIAGNOSIS — R319 Hematuria, unspecified: Secondary | ICD-10-CM | POA: Insufficient documentation

## 2017-06-08 LAB — PAIN MGMT, PROFILE 8 W/CONF, U
6 Acetylmorphine: NEGATIVE ng/mL (ref <10)
ALPHAHYDROXYALPRAZOLAM: 200 ng/mL — AB (ref <25)
ALPHAHYDROXYMIDAZOLAM: NEGATIVE ng/mL (ref <50)
AMINOCLONAZEPAM: NEGATIVE ng/mL (ref <25)
Alcohol Metabolites: NEGATIVE ng/mL (ref <500)
Alphahydroxytriazolam: NEGATIVE ng/mL (ref <50)
Amphetamines: NEGATIVE ng/mL (ref <500)
BENZODIAZEPINES: POSITIVE ng/mL — AB (ref <100)
BUPRENORPHINE, URINE: NEGATIVE ng/mL (ref <5)
CREATININE: 155.4 mg/dL
Cocaine Metabolite: NEGATIVE ng/mL (ref <150)
Hydroxyethylflurazepam: NEGATIVE ng/mL (ref <50)
LORAZEPAM: NEGATIVE ng/mL (ref <50)
MARIJUANA METABOLITE: NEGATIVE ng/mL (ref <20)
MDMA: NEGATIVE ng/mL (ref <500)
Nordiazepam: NEGATIVE ng/mL (ref <50)
OXAZEPAM: NEGATIVE ng/mL (ref <50)
OXYCODONE: NEGATIVE ng/mL (ref <100)
Opiates: NEGATIVE ng/mL (ref <100)
Oxidant: NEGATIVE ug/mL (ref <200)
TEMAZEPAM: NEGATIVE ng/mL (ref <50)
pH: 4.65 (ref 4.5–9.0)

## 2017-06-17 ENCOUNTER — Encounter: Payer: Self-pay | Admitting: Family Medicine

## 2017-06-28 MED FILL — AMOXICILLIN 500 MG CAPSULE: 500 | 6 days supply | Qty: 24 | Fill #2

## 2017-07-05 MED FILL — ALPRAZolam 0.5 MG TABS: 0.5 | 30 days supply | Qty: 30 | Fill #0

## 2017-08-12 ENCOUNTER — Telehealth: Payer: Self-pay | Admitting: Family Medicine

## 2017-08-12 NOTE — Telephone Encounter (Signed)
Rx for Prudy FeelerXANAX was not picked up dated 12/02/2016

## 2017-08-17 MED FILL — AMOXICILLIN 500 MG CAPSULE: 500 | 6 days supply | Qty: 24 | Fill #3

## 2017-08-17 MED FILL — ALPRAZolam 0.5 MG TABS: 0.5 | 30 days supply | Qty: 30 | Fill #1

## 2017-08-22 ENCOUNTER — Telehealth: Payer: Self-pay | Admitting: Family Medicine

## 2017-08-22 NOTE — Telephone Encounter (Unsigned)
Copied from CRM [phone]. Topic: Quick Communication - See Telephone Encounter >> Aug 22, 2017  9:05 AM Cipriano BunkerLambe, Annette S wrote: CRM for notification. See Telephone encounter for:  Pt. Went to see Dr. Lindley MagnusEskew and he said to come to  08/22/17.

## 2017-09-06 ENCOUNTER — Ambulatory Visit: Payer: BLUE CROSS/BLUE SHIELD | Admitting: Family Medicine

## 2017-09-21 MED FILL — ALPRAZolam 0.5 MG TABS: 0.5 | 30 days supply | Qty: 30 | Fill #2

## 2017-09-27 ENCOUNTER — Telehealth: Payer: Self-pay

## 2017-09-27 DIAGNOSIS — R300 Dysuria: Secondary | ICD-10-CM

## 2017-09-27 NOTE — Telephone Encounter (Signed)
Copied from CRM [id number]. Topic: Quick Communication - See Telephone Encounter >> Sep 27, 2017 11:40 AM Elliot GaultBell, Tiffany M wrote: CRM for notification. See Telephone encounter for:   09/27/17.   Relation to pt: self  Call back number: [phone]  Reason for call:  Patient experiencing abdominal pressure, no burning, patient declined appointment and requesting urine orders, please advise >> Sep 27, 2017 11:43 AM Elliot GaultBell, Tiffany M wrote: CRM for notification. See Telephone encounter for:   09/27/17.   Relation to pt: self  Call back number: [phone]  Reason for call:  Patient experiencing abdominal pressure, no burning, patient declined appointment and requesting urine orders, please advise

## 2017-09-28 ENCOUNTER — Other Ambulatory Visit (INDEPENDENT_AMBULATORY_CARE_PROVIDER_SITE_OTHER): Payer: BLUE CROSS/BLUE SHIELD

## 2017-09-28 DIAGNOSIS — R300 Dysuria: Secondary | ICD-10-CM

## 2017-09-28 LAB — URINALYSIS, ROUTINE W REFLEX MICROSCOPIC
Bilirubin Urine: NEGATIVE
KETONES UR: NEGATIVE
Leukocytes, UA: NEGATIVE
Nitrite: NEGATIVE
PH: 5 (ref 5.0–8.0)
Total Protein, Urine: NEGATIVE
UROBILINOGEN UA: 0.2 (ref 0.0–1.0)
Urine Glucose: NEGATIVE

## 2017-09-30 LAB — URINE CULTURE
MICRO NUMBER:: 90160637
SPECIMEN QUALITY: ADEQUATE

## 2017-10-01 ENCOUNTER — Other Ambulatory Visit: Payer: Self-pay | Admitting: Family Medicine

## 2017-10-01 ENCOUNTER — Encounter: Payer: Self-pay | Admitting: Family Medicine

## 2017-10-01 MED ORDER — AMOXICILLIN 500 MG PO CAPS: 500.0000 mg | ORAL_CAPSULE | Freq: Three times a day (TID) | ORAL | 0 refills | Status: DC

## 2017-10-04 ENCOUNTER — Telehealth: Payer: Self-pay | Admitting: Family Medicine

## 2017-10-04 MED ORDER — AMOXICILLIN 500 MG PO CAPS: 500.0000 mg | ORAL_CAPSULE | Freq: Three times a day (TID) | ORAL | 0 refills | Status: DC

## 2017-10-04 MED FILL — AMOXICILLIN 500 MG CAPSULE: 500 | 5 days supply | Qty: 15 | Fill #0

## 2017-10-04 NOTE — Telephone Encounter (Signed)
Updated pharmacy

## 2017-10-04 NOTE — Telephone Encounter (Signed)
Patient wants all RX to go [facility]

## 2017-10-22 ENCOUNTER — Other Ambulatory Visit: Payer: Self-pay | Admitting: Family Medicine

## 2017-10-24 ENCOUNTER — Encounter: Payer: Self-pay | Admitting: Family Medicine

## 2017-10-24 MED FILL — AMOXICILLIN 500 MG CAPSULE: 500 | 6 days supply | Qty: 24 | Fill #4

## 2017-10-24 MED FILL — ALPRAZolam 0.5 MG TABS: 0.5 | 30 days supply | Qty: 30 | Fill #3

## 2017-12-05 ENCOUNTER — Ambulatory Visit: Payer: BLUE CROSS/BLUE SHIELD | Admitting: Family Medicine

## 2017-12-08 ENCOUNTER — Ambulatory Visit: Payer: BLUE CROSS/BLUE SHIELD | Admitting: Family Medicine

## 2017-12-08 ENCOUNTER — Encounter: Payer: Self-pay | Admitting: Family Medicine

## 2017-12-08 ENCOUNTER — Other Ambulatory Visit: Payer: Self-pay | Admitting: Family Medicine

## 2017-12-08 VITALS — BP 154/98 | HR 92 | Temp 98.0°F | Resp 16 | Ht 67.0 in | Wt 162.0 lb

## 2017-12-08 DIAGNOSIS — Z79899 Other long term (current) drug therapy: Secondary | ICD-10-CM | POA: Diagnosis not present

## 2017-12-08 DIAGNOSIS — E1169 Type 2 diabetes mellitus with other specified complication: Secondary | ICD-10-CM

## 2017-12-08 DIAGNOSIS — F418 Other specified anxiety disorders: Secondary | ICD-10-CM

## 2017-12-08 DIAGNOSIS — E669 Obesity, unspecified: Secondary | ICD-10-CM | POA: Diagnosis not present

## 2017-12-08 DIAGNOSIS — R002 Palpitations: Secondary | ICD-10-CM

## 2017-12-08 MED ORDER — ONDANSETRON HCL 4 MG PO TABS: 4.0000 mg | ORAL_TABLET | Freq: Three times a day (TID) | ORAL | 1 refills | Status: DC | PRN

## 2017-12-08 MED ORDER — ALPRAZOLAM 0.5 MG PO TABS: 0.5000 mg | ORAL_TABLET | Freq: Two times a day (BID) | ORAL | 3 refills | Status: DC | PRN

## 2017-12-08 MED FILL — ALPRAZolam 0.5 MG TABS: 0.5 | 30 days supply | Qty: 60 | Fill #0

## 2017-12-08 MED FILL — ONDANSETRON HCL 4 MG TABLET: 4 | 1 days supply | Qty: 5 | Fill #0

## 2017-12-08 NOTE — Patient Instructions (Addendum)
Please stop in the lab for your urine test. Generalized Anxiety Disorder, Adult Generalized anxiety disorder (GAD) is a mental health disorder. People with this condition constantly worry about everyday events. Unlike normal anxiety, worry related to GAD is not triggered by a specific event. These worries also do not fade or get better with time. GAD interferes with life functions, including relationships, work, and school. GAD can vary from mild to severe. People with severe GAD can have intense waves of anxiety with physical symptoms (panic attacks). What are the causes? The exact cause of GAD is not known. What increases the risk? This condition is more likely to develop in:  Women.  People who have a family history of anxiety disorders.  People who are very shy.  People who experience very stressful life events, such as the death of a loved one.  People who have a very stressful family environment.  What are the signs or symptoms? People with GAD often worry excessively about many things in their lives, such as their health and family. They may also be overly concerned about:  Doing well at work.  Being on time.  Natural disasters.  Friendships.  Physical symptoms of GAD include:  Fatigue.  Muscle tension or having muscle twitches.  Trembling or feeling shaky.  Being easily startled.  Feeling like your heart is pounding or racing.  Feeling out of breath or like you cannot take a deep breath.  Having trouble falling asleep or staying asleep.  Sweating.  Nausea, diarrhea, or irritable bowel syndrome (IBS).  Headaches.  Trouble concentrating or remembering facts.  Restlessness.  Irritability.  How is this diagnosed? Your health care provider can diagnose GAD based on your symptoms and medical history. You will also have a physical exam. The health care provider will ask specific questions about your symptoms, including how severe they are, when they  started, and if they come and go. Your health care provider may ask you about your use of alcohol or drugs, including prescription medicines. Your health care provider may refer you to a mental health specialist for further evaluation. Your health care provider will do a thorough examination and may perform additional tests to rule out other possible causes of your symptoms. To be diagnosed with GAD, a person must have anxiety that:  Is out of his or her control.  Affects several different aspects of his or her life, such as work and relationships.  Causes distress that makes him or her unable to take part in normal activities.  Includes at least three physical symptoms of GAD, such as restlessness, fatigue, trouble concentrating, irritability, muscle tension, or sleep problems.  Before your health care provider can confirm a diagnosis of GAD, these symptoms must be present more days than they are not, and they must last for six months or longer. How is this treated? The following therapies are usually used to treat GAD:  Medicine. Antidepressant medicine is usually prescribed for long-term daily control. Antianxiety medicines may be added in severe cases, especially when panic attacks occur.  Talk therapy (psychotherapy). Certain types of talk therapy can be helpful in treating GAD by providing support, education, and guidance. Options include: ? Cognitive behavioral therapy (CBT). People learn coping skills and techniques to ease their anxiety. They learn to identify unrealistic or negative thoughts and behaviors and to replace them with positive ones. ? Acceptance and commitment therapy (ACT). This treatment teaches people how to be mindful as a way to cope with unwanted thoughts  and feelings. ? Biofeedback. This process trains you to manage your body's response (physiological response) through breathing techniques and relaxation methods. You will work with a therapist while machines are used  to monitor your physical symptoms.  Stress management techniques. These include yoga, meditation, and exercise.  A mental health specialist can help determine which treatment is best for you. Some people see improvement with one type of therapy. However, other people require a combination of therapies. Follow these instructions at home:  Take over-the-counter and prescription medicines only as told by your health care provider.  Try to maintain a normal routine.  Try to anticipate stressful situations and allow extra time to manage them.  Practice any stress management or self-calming techniques as taught by your health care provider.  Do not punish yourself for setbacks or for not making progress.  Try to recognize your accomplishments, even if they are small.  Keep all follow-up visits as told by your health care provider. This is important. Contact a health care provider if:  Your symptoms do not get better.  Your symptoms get worse.  You have signs of depression, such as: ? A persistently sad, cranky, or irritable mood. ? Loss of enjoyment in activities that used to bring you joy. ? Change in weight or eating. ? Changes in sleeping habits. ? Avoiding friends or family members. ? Loss of energy for normal tasks. ? Feelings of guilt or worthlessness. Get help right away if:  You have serious thoughts about hurting yourself or others. If you ever feel like you may hurt yourself or others, or have thoughts about taking your own life, get help right away. You can go to your nearest emergency department or call:  Your local emergency services (911 in the [place]  A suicide crisis helpline, such as the National Suicide Prevention Lifeline at (719)738-81231-657-390-3029. This is open 24 hours a day.  Summary  Generalized anxiety disorder (GAD) is a mental health disorder that involves worry that is not triggered by a specific event.  People with GAD often worry excessively about many  things in their lives, such as their health and family.  GAD may cause physical symptoms such as restlessness, trouble concentrating, sleep problems, frequent sweating, nausea, diarrhea, headaches, and trembling or muscle twitching.  A mental health specialist can help determine which treatment is best for you. Some people see improvement with one type of therapy. However, other people require a combination of therapies. This information is not intended to replace advice given to you by your health care provider. Make sure you discuss any questions you have with your health care provider. Document Released: 12/04/2012 Document Revised: 06/29/2016 Document Reviewed: 06/29/2016 Elsevier Interactive Patient Education  [employer]2018 [employer]

## 2017-12-08 NOTE — Telephone Encounter (Signed)
Requesting:Alprazolam Contract:06/03/17 UDS:10/812/818 low risk due for updated UDS Last Visit:06/03/17 Next Visit:12/08/17  Last Refill:06/03/17 5 Refills  Please Advise

## 2017-12-09 LAB — COMPREHENSIVE METABOLIC PANEL
AG RATIO: 1.5 (calc) (ref 1.0–2.5)
ALKALINE PHOSPHATASE (APISO): 67 U/L (ref 33–130)
ALT: 15 U/L (ref 6–29)
AST: 15 U/L (ref 10–35)
Albumin: 4.3 g/dL (ref 3.6–5.1)
BILIRUBIN TOTAL: 0.3 mg/dL (ref 0.2–1.2)
BUN: 10 mg/dL (ref 7–25)
CALCIUM: 9.5 mg/dL (ref 8.6–10.4)
CHLORIDE: 105 mmol/L (ref 98–110)
CO2: 26 mmol/L (ref 20–32)
Creat: 0.84 mg/dL (ref 0.50–1.05)
GLOBULIN: 2.8 g/dL (ref 1.9–3.7)
GLUCOSE: 182 mg/dL — AB (ref 65–99)
Potassium: 3.9 mmol/L (ref 3.5–5.3)
Sodium: 139 mmol/L (ref 135–146)
Total Protein: 7.1 g/dL (ref 6.1–8.1)

## 2017-12-09 LAB — HEMOGLOBIN A1C
HEMOGLOBIN A1C: 6.8 %{Hb} — AB (ref <5.7)
Mean Plasma Glucose: 148 (calc)
eAG (mmol/L): 8.2 (calc)

## 2017-12-11 LAB — PAIN MGMT, PROFILE 8 W/CONF, U
6 Acetylmorphine: NEGATIVE ng/mL (ref <10)
ALPHAHYDROXYALPRAZOLAM: 177 ng/mL — AB (ref <25)
ALPHAHYDROXYTRIAZOLAM: NEGATIVE ng/mL (ref <50)
AMINOCLONAZEPAM: NEGATIVE ng/mL (ref <25)
AMPHETAMINES: NEGATIVE ng/mL (ref <500)
Alcohol Metabolites: NEGATIVE ng/mL (ref <500)
Alphahydroxymidazolam: NEGATIVE ng/mL (ref <50)
BENZODIAZEPINES: POSITIVE ng/mL — AB (ref <100)
BUPRENORPHINE, URINE: NEGATIVE ng/mL (ref <5)
CREATININE: 121 mg/dL
Cocaine Metabolite: NEGATIVE ng/mL (ref <150)
Hydroxyethylflurazepam: NEGATIVE ng/mL (ref <50)
Lorazepam: NEGATIVE ng/mL (ref <50)
MDMA: NEGATIVE ng/mL (ref <500)
Marijuana Metabolite: NEGATIVE ng/mL (ref <20)
Nordiazepam: NEGATIVE ng/mL (ref <50)
OPIATES: NEGATIVE ng/mL (ref <100)
OXAZEPAM: NEGATIVE ng/mL (ref <50)
OXYCODONE: NEGATIVE ng/mL (ref <100)
Oxidant: NEGATIVE ug/mL (ref <200)
PH: 6.14 (ref 4.5–9.0)
TEMAZEPAM: NEGATIVE ng/mL (ref <50)

## 2017-12-12 NOTE — Progress Notes (Signed)
Patient ID: Ashley Jackson, female   DOB: March 01, 1961, 57 y.o.   MRN: [id number]   Subjective:    Patient ID: Ashley Jackson, female    DOB: October 22, 1960, 58 y.o.   MRN: [id number]  Chief Complaint  Patient presents with  . Diabetes    Pt here for follow up  . depression with anxiety    Pt here for follow up. Pt continues to have stress, has lost 13 pounds. Wants to discuss increasing alprazolam dose.    HPI Patient is in today for follow up . She is very tearful and sad. She is having to care for her father with dementia 24/7 by herself. She does not get any respite. She is very tired and stressed. Does not get to sleep through the night. She has lost a good bit of weight due to stress and anxiety no recent febrile illness or hospitalizations. She notes anhedonia but denies suicidal ideation. Denies CP/palp/SOB/HA/congestion/fevers/GI or GU c/o. Taking meds as prescribed  Past Medical History:  Diagnosis Date  . Allergic state 01/24/2017  . Anxiety state, unspecified 07/12/2007  . COLONIC POLYPS, HX OF 05/02/2007  . DEPRESSION 07/12/2007  . DIABETES MELLITUS, TYPE II, UNCONTROLLED 02/25/2010  . EXOGENOUS OBESITY 11/07/2007  . Hematuria 06/03/2017  . HYPERGLYCEMIA 07/12/2007  . HYPERLIPIDEMIA 02/07/2008  . Low back pain 04/29/2013  . Overweight(278.02) 06/24/2010  . Palpitations 01/20/2016  . Preventative health care 04/29/2013  . Tobacco abuse 10/24/2012  . TOBACCO USER 06/24/2010    Past Surgical History:  Procedure Laterality Date  . COLONOSCOPY  2006    Family History  Problem Relation Age of Onset  . Liver cancer Mother   . Hypertension Other   . Hypertension Father   . Stroke Brother   . Seizures Son   . Diabetes Maternal Uncle   . Breast cancer Neg Hx   . Prostate cancer Neg Hx   . Heart disease Neg Hx     Social History   Socioeconomic History  . Marital status: Married    Spouse name: Not on file  . Number of children: Not on file  . Years of education: Not on file  .  Highest education level: Not on file  Occupational History  . Not on file  Social Needs  . Financial resource strain: Not on file  . Food insecurity:    Worry: Not on file    Inability: Not on file  . Transportation needs:    Medical: Not on file    Non-medical: Not on file  Tobacco Use  . Smoking status: Current Every Day Smoker    Packs/day: 1.00  . Smokeless tobacco: Never Used  Substance and Sexual Activity  . Alcohol use: Not on file  . Drug use: No  . Sexual activity: Not Currently  Lifestyle  . Physical activity:    Days per week: Not on file    Minutes per session: Not on file  . Stress: Not on file  Relationships  . Social connections:    Talks on phone: Not on file    Gets together: Not on file    Attends religious service: Not on file    Active member of club or organization: Not on file    Attends meetings of clubs or organizations: Not on file    Relationship status: Not on file  . Intimate partner violence:    Fear of current or ex partner: Not on file    Emotionally abused: Not on file  Physically abused: Not on file    Forced sexual activity: Not on file  Other Topics Concern  . Not on file  Social History Narrative  . Not on file    Outpatient Medications Prior to Visit  Medication Sig Dispense Refill  . diphenhydrAMINE (BENADRYL) 25 MG tablet Take 25 mg by mouth every other day.    . ALPRAZolam (XANAX) 0.5 MG tablet Take 1 tablet (0.5 mg total) by mouth daily as needed for anxiety. 30 tablet 5  . amoxicillin (AMOXIL) 500 MG capsule Take 1 capsule (500 mg total) by mouth 3 (three) times daily. (Patient not taking: Reported on 12/08/2017) 15 capsule 0  . guaiFENesin-codeine (VIRTUSSIN A/C) 100-10 MG/5ML syrup Take 5 mLs by mouth 3 (three) times daily as needed for cough. (Patient not taking: Reported on 12/08/2017) 120 mL 0   No facility-administered medications prior to visit.     Allergies  Allergen Reactions  . Levofloxacin     anxiety  .  Sulfa Antibiotics     Panic attacks  . Sulfonamide Derivatives     REACTION: Panic attacks    Review of Systems  Constitutional: Positive for malaise/fatigue. Negative for fever.  HENT: Negative for congestion.   Eyes: Negative for blurred vision.  Respiratory: Negative for shortness of breath.   Cardiovascular: Negative for chest pain, palpitations and leg swelling.  Gastrointestinal: Negative for abdominal pain, blood in stool and nausea.  Genitourinary: Negative for dysuria and frequency.  Musculoskeletal: Negative for falls.  Skin: Negative for rash.  Neurological: Negative for dizziness, loss of consciousness and headaches.  Endo/Heme/Allergies: Negative for environmental allergies.  Psychiatric/Behavioral: Positive for depression. Negative for hallucinations, substance abuse and suicidal ideas. The patient is nervous/anxious and has insomnia.        Objective:    Physical Exam  Constitutional: She is oriented to person, place, and time. She appears well-developed and well-nourished. No distress.  HENT:  Head: Normocephalic and atraumatic.  Nose: Nose normal.  Eyes: Right eye exhibits no discharge. Left eye exhibits no discharge.  Neck: Normal range of motion. Neck supple.  Cardiovascular: Normal rate and regular rhythm.  No murmur heard. Pulmonary/Chest: Effort normal and breath sounds normal.  Abdominal: Soft. Bowel sounds are normal. There is no tenderness.  Musculoskeletal: She exhibits no edema.  Neurological: She is alert and oriented to person, place, and time.  Skin: Skin is warm and dry.  Psychiatric: She has a normal mood and affect.  Labile and tearful   Nursing note and vitals reviewed.   BP (!) 154/98 (BP Location: Right Arm, Cuff Size: Normal)   Pulse 92   Temp 98 F (36.7 C) (Oral)   Resp 16   Ht 5' 7"  (1.702 m)   Wt 162 lb (73.5 kg)   LMP 12/09/2015   SpO2 98%   BMI 25.37 kg/m  Wt Readings from Last 3 Encounters:  12/08/17 162 lb (73.5 kg)    06/03/17 174 lb 9.6 oz (79.2 kg)  01/24/17 178 lb (80.7 kg)     Lab Results  Component Value Date   WBC 6.7 01/24/2017   HGB 15.0 01/24/2017   HCT 44.4 01/24/2017   PLT 281.0 01/24/2017   GLUCOSE 182 (H) 12/08/2017   CHOL 184 10/19/2016   TRIG 171.0 (H) 10/19/2016   HDL 49.50 10/19/2016   LDLDIRECT 131.3 09/30/2010   LDLCALC 100 (H) 10/19/2016   ALT 15 12/08/2017   AST 15 12/08/2017   NA 139 12/08/2017   K 3.9 12/08/2017  CL 105 12/08/2017   CREATININE 0.84 12/08/2017   BUN 10 12/08/2017   CO2 26 12/08/2017   TSH 1.31 01/24/2017   HGBA1C 6.8 (H) 12/08/2017   MICROALBUR 0.72 10/20/2012    Lab Results  Component Value Date   TSH 1.31 01/24/2017   Lab Results  Component Value Date   WBC 6.7 01/24/2017   HGB 15.0 01/24/2017   HCT 44.4 01/24/2017   MCV 91.7 01/24/2017   PLT 281.0 01/24/2017   Lab Results  Component Value Date   NA 139 12/08/2017   K 3.9 12/08/2017   CO2 26 12/08/2017   GLUCOSE 182 (H) 12/08/2017   BUN 10 12/08/2017   CREATININE 0.84 12/08/2017   BILITOT 0.3 12/08/2017   ALKPHOS 56 06/03/2017   AST 15 12/08/2017   ALT 15 12/08/2017   PROT 7.1 12/08/2017   ALBUMIN 4.3 06/03/2017   CALCIUM 9.5 12/08/2017   GFR 74.53 06/03/2017   Lab Results  Component Value Date   CHOL 184 10/19/2016   Lab Results  Component Value Date   HDL 49.50 10/19/2016   Lab Results  Component Value Date   LDLCALC 100 (H) 10/19/2016   Lab Results  Component Value Date   TRIG 171.0 (H) 10/19/2016   Lab Results  Component Value Date   CHOLHDL 4 10/19/2016   Lab Results  Component Value Date   HGBA1C 6.8 (H) 12/08/2017       Assessment & Plan:   Problem List Items Addressed This Visit    Diabetes mellitus type 2 in obese ([place])    hgba1c improved quite a bit no changes      Relevant Orders   HgB A1c (Completed)   Comp Met (CMET) (Completed)   Obesity    Encouraged DASH diet, decrease po intake and increase exercise as tolerated. Needs 7-8  hours of sleep nightly. Avoid trans fats, eat small, frequent meals every 4-5 hours with lean proteins, complex carbs and healthy fats. Minimize simple carbs, has noted weight loss due to all of her stressors unfortunately      Depression with anxiety    Patient very tearful. 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 He is becoming increasingly more agitated and her brothers are refusing to help her. She is encouraged to consider an SSRI but declines. She is also encouraged to tell her family she cannot do the work alone but she feels she has already told them. There is a family meeting soon she will use to discuss again. She may continue Alprazolam prn fo rnow. UDS and contract UTD      Relevant Medications   ALPRAZolam (XANAX) 0.5 MG tablet    Other Visit Diagnoses    High risk medication use    -  Primary   Relevant Medications   ALPRAZolam (XANAX) 0.5 MG tablet   Other Relevant Orders   Pain Mgmt, Profile 8 w/Conf, U (Completed)      I have discontinued Garvin Fila. Paluch's guaiFENesin-codeine, ALPRAZolam, and amoxicillin. I am also having her start on ALPRAZolam and ondansetron. Additionally, I am having her maintain her diphenhydrAMINE.  Meds ordered this encounter  Medications  . ALPRAZolam (XANAX) 0.5 MG tablet    Sig: Take 1 tablet (0.5 mg total) by mouth 2 (two) times daily as needed for anxiety.    Dispense:  60 tablet    Refill:  3  . ondansetron (ZOFRAN) 4 MG tablet    Sig: Take 1 tablet (4 mg  total) by mouth every 8 (eight) hours as needed for nausea or vomiting.    Dispense:  5 tablet    Refill:  1     Penni Homans, MD

## 2017-12-12 NOTE — Assessment & Plan Note (Signed)
Encouraged DASH diet, decrease po intake and increase exercise as tolerated. Needs 7-8 hours of sleep nightly. Avoid trans fats, eat small, frequent meals every 4-5 hours with lean proteins, complex carbs and healthy fats. Minimize simple carbs, has noted weight loss due to all of her stressors unfortunately

## 2017-12-12 NOTE — Assessment & Plan Note (Signed)
Patient very tearful. 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 He is becoming increasingly more agitated and her brothers are refusing to help her. She is encouraged to consider an SSRI but declines. She is also encouraged to tell her family she cannot do the work alone but she feels she has already told them. There is a family meeting soon she will use to discuss again. She may continue Alprazolam prn fo rnow. UDS and contract UTD

## 2017-12-12 NOTE — Assessment & Plan Note (Signed)
hgba1c improved quite a bit no changes

## 2018-01-05 ENCOUNTER — Encounter: Payer: Self-pay | Admitting: Family Medicine

## 2018-01-10 MED FILL — ALPRAZolam 0.5 MG TABS: 0.5 | 30 days supply | Qty: 60 | Fill #1

## 2018-01-21 ENCOUNTER — Encounter: Payer: Self-pay | Admitting: Family Medicine

## 2018-01-23 ENCOUNTER — Telehealth: Payer: Self-pay | Admitting: Hematology

## 2018-01-23 NOTE — Telephone Encounter (Signed)
Patient called to see why her medication had not been called in. I let the patient know there is a 48-72 hour window for medication refill. She said she was not going to wait that long and asked to make an appointment. She is scheduled for tomorrow morning at 9am

## 2018-01-23 NOTE — Telephone Encounter (Signed)
LOV 12/08/2017 with Dr Abner GreenspanBlyth  Per below patient is requesting to increase medication Copied from CRM [id number]. Topic: Quick Communication - Rx Refill/Question >> Jan 23, 2018  7:39 AM Oneal GroutSebastian, Jennifer S wrote: Medication: sertraline (ZOLOFT) 100 MG tablet and ALPRAZolam (XANAX) 0.5 MG tablet, would like to increase dose to 3 tab daily  Has the patient contacted their pharmacy? No, requesting new dose (Agent: If no, request that the patient contact the pharmacy for the refill.) (Agent: If yes, when and what did the pharmacy advise?)  Preferred Pharmacy (with phone number or street name): [facility] [place]  Agent: Please be advised that RX refills may take up to 3 business days. We ask that you follow-up with your pharmacy.

## 2018-01-23 NOTE — Telephone Encounter (Signed)
OK to increase her Alprazolam to tid disp #90 with 1 rf but warn her it becomes harder to come off of the more she takes it. Keep appt in July to discuss ongoing use and options

## 2018-01-24 ENCOUNTER — Ambulatory Visit: Payer: BLUE CROSS/BLUE SHIELD | Admitting: Family Medicine

## 2018-01-24 VITALS — BP 138/90 | HR 77 | Temp 97.7°F | Resp 16 | Ht 67.0 in | Wt 158.2 lb

## 2018-01-24 DIAGNOSIS — F17200 Nicotine dependence, unspecified, uncomplicated: Secondary | ICD-10-CM

## 2018-01-24 DIAGNOSIS — M25512 Pain in left shoulder: Secondary | ICD-10-CM | POA: Diagnosis not present

## 2018-01-24 DIAGNOSIS — Z79899 Other long term (current) drug therapy: Secondary | ICD-10-CM | POA: Diagnosis not present

## 2018-01-24 DIAGNOSIS — M7051 Other bursitis of knee, right knee: Secondary | ICD-10-CM | POA: Diagnosis not present

## 2018-01-24 DIAGNOSIS — M7580 Other shoulder lesions, unspecified shoulder: Secondary | ICD-10-CM

## 2018-01-24 DIAGNOSIS — M25511 Pain in right shoulder: Secondary | ICD-10-CM | POA: Diagnosis not present

## 2018-01-24 DIAGNOSIS — F418 Other specified anxiety disorders: Secondary | ICD-10-CM

## 2018-01-24 DIAGNOSIS — E1169 Type 2 diabetes mellitus with other specified complication: Secondary | ICD-10-CM | POA: Diagnosis not present

## 2018-01-24 DIAGNOSIS — E669 Obesity, unspecified: Secondary | ICD-10-CM

## 2018-01-24 MED ORDER — SERTRALINE HCL 50 MG PO TABS: 50.0000 mg | ORAL_TABLET | Freq: Every day | ORAL | 3 refills | Status: DC

## 2018-01-24 MED ORDER — ALPRAZOLAM 0.5 MG PO TABS: 0.5000 mg | ORAL_TABLET | Freq: Three times a day (TID) | ORAL | 2 refills | Status: DC | PRN

## 2018-01-24 MED FILL — SERTRALINE HCL 50 MG TABLET: 50 | 30 days supply | Qty: 30 | Fill #0

## 2018-01-24 NOTE — Assessment & Plan Note (Signed)
Continues to struggle with severe stress being the live in primary care provider for her very ill father with dementia. She had to go to the ER for a panic attack with SOB, tremors and fear. So she is increased to 3 Alprazolam a day and if that is not enough she can contact us to consider 4 a day for a short time. If she has a full panic attack on 3 a day she is instructed to take a 4th to avoid the ER and let us know. She agrees to restart Sertraline as well due to her ongoing stressors

## 2018-01-24 NOTE — Assessment & Plan Note (Signed)
[facility], Dr Reynolds BowlLu who recommended [facility]

## 2018-01-24 NOTE — Assessment & Plan Note (Signed)
Encouraged complete cessation. Discussed need to quit as relates to risk of numerous cancers, cardiac and pulmonary disease as well as neurologic complications. Counseled for greater than 3 minutes. She agrees to try nicotine replacement and to cut down.

## 2018-01-24 NOTE — Assessment & Plan Note (Signed)
Seen at urgent care last week with knee pain, Encouraged moist heat or ice and gentle stretching as tolerated. May try NSAIDs and prescription meds as directed and report if symptoms worsen or seek immediate care

## 2018-01-24 NOTE — Assessment & Plan Note (Signed)
hgba1c acceptable, minimize simple carbs. Increase exercise as tolerated.  

## 2018-01-24 NOTE — Patient Instructions (Signed)
Panic Attack A panic attack is a sudden episode of severe anxiety, fear, or discomfort that causes physical and emotional symptoms. The attack may be in response to something frightening, or it may occur for no known reason. Symptoms of a panic attack can be similar to symptoms of a heart attack or stroke. It is important to see your health care provider when you have a panic attack so that these conditions can be ruled out. A panic attack is a symptom of another condition. Most panic attacks go away with treatment of the underlying problem. If you have panic attacks often, you may have a condition called panic disorder. What are the causes? A panic attack may be caused by:  An extreme, life-threatening situation, such as a war or natural disaster.  An anxiety disorder, such as post-traumatic stress disorder.  Depression.  Certain medical conditions, including heart problems, neurological conditions, and infections.  Certain over-the-counter and prescription medicines.  Illegal drugs that increase heart rate and blood pressure, such as methamphetamine.  Alcohol.  Supplements that increase anxiety.  Panic disorder.  What increases the risk? You are more likely to develop this condition if:  You have an anxiety disorder.  You have another mental health condition.  You take certain medicines.  You use alcohol, illegal drugs, or other substances.  You are under extreme stress.  A life event is causing increased feelings of anxiety and depression.  What are the signs or symptoms? A panic attack starts suddenly, usually lasts about 20 minutes, and occurs with one or more of the following:  A pounding heart.  A feeling that your heart is beating irregularly or faster than normal (palpitations).  Sweating.  Trembling or shaking.  Shortness of breath or feeling smothered.  Feeling choked.  Chest pain or discomfort.  Nausea or a strange feeling in your  stomach.  Dizziness, feeling lightheaded, or feeling like you might faint.  Chills or hot flashes.  Numbness or tingling in your lips, hands, or feet.  Feeling confused, or feeling that you are not yourself.  Fear of losing control or being emotionally unstable.  Fear of dying.  How is this diagnosed? A panic attack is diagnosed with an assessment by your health care provider. During the assessment your health care provider will ask questions about:  Your history of anxiety, depression, and panic attacks.  Your medical history.  Whether you drink alcohol, use illegal drugs, take supplements, or take medicines. Be honest about your substance use.  Your health care provider may also:  Order blood tests or other kinds of tests to rule out serious medical conditions.  Refer you to a mental health professional for further evaluation.  How is this treated? Treatment depends on the cause of the panic attack:  If the cause is a medical problem, your health care provider will either treat that problem or refer you to a specialist.  If the cause is emotional, you may be given anti-anxiety medicines or referred to a counselor. These medicines may reduce how often attacks happen, reduce how severe the attacks are, and lower anxiety.  If the cause is a medicine, your health care provider may tell you to stop the medicine, change your dose, or take a different medicine.  If the cause is a drug, treatment may involve letting the drug wear off and taking medicine to help the drug leave your body or to counteract its effects. Attacks caused by drug abuse may continue even if you stop using   the drug.  Follow these instructions at home:  Take over-the-counter and prescription medicines only as told by your health care provider.  If you feel anxious, limit your caffeine intake.  Take good care of your physical and mental health by: ? Eating a balanced diet that includes plenty of fresh  fruits and vegetables, whole grains, lean meats, and low-fat dairy. ? Getting plenty of rest. Try to get 7-8 hours of uninterrupted sleep each night. ? Exercising regularly. Try to get 30 minutes of physical activity at least 5 days a week. ? Not smoking. Talk to your health care provider if you need help quitting. ? Limiting alcohol intake to no more than 1 drink a day for nonpregnant women and 2 drinks a day for men. One drink equals 12 oz of beer, 5 oz of wine, or 1 oz of hard liquor.  Keep all follow-up visits as told by your health care provider. This is important. Panic attacks may have underlying physical or emotional problems that take time to accurately diagnose. Contact a health care provider if:  Your symptoms do not improve, or they get worse.  You are not able to take your medicine as prescribed because of side effects. Get help right away if:  You have serious thoughts about hurting yourself or others.  You have symptoms of a panic attack. Do not drive yourself to the hospital. Have someone else drive you or call an ambulance. If you ever feel like you may hurt yourself or others, or you have thoughts about taking your own life, get help right away. You can go to your nearest emergency department or call:  Your local emergency services (911 in the [place]  A suicide crisis helpline, such as the National Suicide Prevention Lifeline at 1-800-273-8255. This is open 24 hours a day.  Summary  A panic attack is a sign of a serious health or mental health condition. Get help right away. Do not drive yourself to the hospital. Have someone else drive you or call an ambulance.  Always see a health care provider to have the reasons for the panic attack correctly diagnosed.  If your panic attack was caused by a physical problem, follow your health care provider's suggestions for medicine, referral to a specialist, and lifestyle changes.  If your panic attack was caused by an  emotional problem, follow through with counseling from a qualified mental health specialist.  If you feel like you may hurt yourself or others, call 911 and get help right away. This information is not intended to replace advice given to you by your health care provider. Make sure you discuss any questions you have with your health care provider. Document Released: 08/09/2005 Document Revised: 09/17/2016 Document Reviewed: 09/17/2016 Elsevier Interactive Patient Education  2018 [employer]  

## 2018-01-24 NOTE — Progress Notes (Signed)
Patient ID: Ashley Jackson, female   DOB: 12-27-1960, 57 y.o.   MRN: [id number]   Subjective:    Patient ID: Ashley Jackson, female    DOB: [birth year], 57 y.o.   MRN: [id number]  Chief Complaint  Patient presents with  . Medication Management    HPI Patient is in today for follow up from an ER visit last week for an anxiety attack. She went to [facility] in [place] and her results from that visit were reviewed with patient. 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 She is very tired but denies nay signs of acute illness such as congestion or Fever. Denies CP/HA/congestion/fevers/GI or GU c/o. Taking meds as prescribed. She was seen at sports med this past week for b/l shoulder pain and right knee pain diagnosed with rotator cuff tendonitis b/l and bursitis in knee. It is not worsening but not improving. Is due to work history and working with her father. No fall or trauma. No redness or warmth.  Past Medical History:  Diagnosis Date  . Allergic state 01/24/2017  . Anxiety state, unspecified 07/12/2007  . COLONIC POLYPS, HX OF 05/02/2007  . DEPRESSION 07/12/2007  . DIABETES MELLITUS, TYPE II, UNCONTROLLED 02/25/2010  . EXOGENOUS OBESITY 11/07/2007  . Hematuria 06/03/2017  . HYPERGLYCEMIA 07/12/2007  . HYPERLIPIDEMIA 02/07/2008  . Low back pain 04/29/2013  . Overweight(278.02) 06/24/2010  . Palpitations 01/20/2016  . Preventative health care 04/29/2013  . Tobacco abuse 10/24/2012  . TOBACCO USER 06/24/2010    Past Surgical History:  Procedure Laterality Date  . COLONOSCOPY  2006    Family History  Problem Relation Age of Onset  . Liver cancer Mother   . Hypertension Other   . Hypertension Father   . Stroke Brother   . Seizures Son   . Diabetes Maternal Uncle   . Breast cancer Neg Hx   . Prostate cancer Neg Hx   . Heart disease Neg Hx     Social History   Socioeconomic History  . Marital  status: Married    Spouse name: Not on file  . Number of children: Not on file  . Years of education: Not on file  . Highest education level: Not on file  Occupational History  . Not on file  Social Needs  . Financial resource strain: Not on file  . Food insecurity:    Worry: Not on file    Inability: Not on file  . Transportation needs:    Medical: Not on file    Non-medical: Not on file  Tobacco Use  . Smoking status: Current Every Day Smoker    Packs/day: 1.00  . Smokeless tobacco: Never Used  Substance and Sexual Activity  . Alcohol use: Not on file  . Drug use: No  . Sexual activity: Not Currently  Lifestyle  . Physical activity:    Days per week: Not on file    Minutes per session: Not on file  . Stress: Not on file  Relationships  . Social connections:    Talks on phone: Not on file    Gets together: Not on file    Attends religious service: Not on file    Active member of club or organization: Not on file    Attends meetings of clubs or organizations: Not on file    Relationship status: Not on file  . Intimate partner violence:  Fear of current or ex partner: Not on file    Emotionally abused: Not on file    Physically abused: Not on file    Forced sexual activity: Not on file  Other Topics Concern  . Not on file  Social History Narrative  . Not on file    Outpatient Medications Prior to Visit  Medication Sig Dispense Refill  . ALPRAZolam (XANAX) 0.5 MG tablet Take 1 tablet (0.5 mg total) by mouth 2 (two) times daily as needed for anxiety. 60 tablet 3  . diphenhydrAMINE (BENADRYL) 25 MG tablet Take 25 mg by mouth every other day.    . ondansetron (ZOFRAN) 4 MG tablet Take 1 tablet (4 mg total) by mouth every 8 (eight) hours as needed for nausea or vomiting. 5 tablet 1   No facility-administered medications prior to visit.     Allergies  Allergen Reactions  . Atarax [Hydroxyzine] Shortness Of Breath    Anxiety, shortness of breath, tachycardia   .  Levofloxacin     anxiety  . Sulfa Antibiotics     Panic attacks  . Sulfonamide Derivatives     REACTION: Panic attacks  . Levofloxacin Anxiety    Review of Systems  Constitutional: Positive for malaise/fatigue. Negative for chills and fever.  HENT: Negative for congestion and hearing loss.   Eyes: Negative for discharge.  Respiratory: Positive for shortness of breath. Negative for cough and sputum production.   Cardiovascular: Positive for palpitations. Negative for chest pain and leg swelling.  Gastrointestinal: Negative for abdominal pain, blood in stool, constipation, diarrhea, heartburn, nausea and vomiting.  Genitourinary: Negative for dysuria, frequency, hematuria and urgency.  Musculoskeletal: Positive for joint pain. Negative for back pain, falls and myalgias.  Skin: Negative for rash.  Neurological: Negative for dizziness, sensory change, loss of consciousness, weakness and headaches.  Endo/Heme/Allergies: Negative for environmental allergies. Does not bruise/bleed easily.  Psychiatric/Behavioral: Positive for depression. Negative for hallucinations and suicidal ideas. The patient is nervous/anxious. The patient does not have insomnia.        Objective:    Physical Exam  Constitutional: She is oriented to person, place, and time. No distress.  HENT:  Head: Normocephalic and atraumatic.  Eyes: Conjunctivae are normal.  Neck: Neck supple. No thyromegaly present.  Cardiovascular: Normal rate, regular rhythm and normal heart sounds.  No murmur heard. Pulmonary/Chest: Effort normal and breath sounds normal. She has no wheezes.  Abdominal: She exhibits no distension and no mass.  Musculoskeletal: She exhibits no edema.  Lymphadenopathy:    She has no cervical adenopathy.  Neurological: She is alert and oriented to person, place, and time.  Skin: Skin is warm and dry. No rash noted. She is not diaphoretic.  Psychiatric: Judgment normal.    BP 138/90 (BP Location: Right  Arm, Patient Position: Sitting, Cuff Size: Normal)   Pulse 77   Temp 97.7 F (36.5 C) (Oral)   Resp 16   Ht 5\' 7"  (1.702 m)   Wt 158 lb 3.2 oz (71.8 kg)   LMP 12/09/2015   SpO2 99%   BMI 24.78 kg/m  Wt Readings from Last 3 Encounters:  01/24/18 158 lb 3.2 oz (71.8 kg)  12/08/17 162 lb (73.5 kg)  06/03/17 174 lb 9.6 oz (79.2 kg)     Lab Results  Component Value Date   WBC 6.7 01/24/2017   HGB 15.0 01/24/2017   HCT 44.4 01/24/2017   PLT 281.0 01/24/2017   GLUCOSE 182 (H) 12/08/2017   CHOL 184 10/19/2016  TRIG 171.0 (H) 10/19/2016   HDL 49.50 10/19/2016   LDLDIRECT 131.3 09/30/2010   LDLCALC 100 (H) 10/19/2016   ALT 15 12/08/2017   AST 15 12/08/2017   NA 139 12/08/2017   K 3.9 12/08/2017   CL 105 12/08/2017   CREATININE 0.84 12/08/2017   BUN 10 12/08/2017   CO2 26 12/08/2017   TSH 1.31 01/24/2017   HGBA1C 6.8 (H) 12/08/2017   MICROALBUR 0.72 10/20/2012    Lab Results  Component Value Date   TSH 1.31 01/24/2017   Lab Results  Component Value Date   WBC 6.7 01/24/2017   HGB 15.0 01/24/2017   HCT 44.4 01/24/2017   MCV 91.7 01/24/2017   PLT 281.0 01/24/2017   Lab Results  Component Value Date   NA 139 12/08/2017   K 3.9 12/08/2017   CO2 26 12/08/2017   GLUCOSE 182 (H) 12/08/2017   BUN 10 12/08/2017   CREATININE 0.84 12/08/2017   BILITOT 0.3 12/08/2017   ALKPHOS 56 06/03/2017   AST 15 12/08/2017   ALT 15 12/08/2017   PROT 7.1 12/08/2017   ALBUMIN 4.3 06/03/2017   CALCIUM 9.5 12/08/2017   GFR 74.53 06/03/2017   Lab Results  Component Value Date   CHOL 184 10/19/2016   Lab Results  Component Value Date   HDL 49.50 10/19/2016   Lab Results  Component Value Date   LDLCALC 100 (H) 10/19/2016   Lab Results  Component Value Date   TRIG 171.0 (H) 10/19/2016   Lab Results  Component Value Date   CHOLHDL 4 10/19/2016   Lab Results  Component Value Date   HGBA1C 6.8 (H) 12/08/2017       Assessment & Plan:   Problem List Items Addressed  This Visit    Diabetes mellitus type 2 in obese (HCC)    hgba1c acceptable, minimize simple carbs. Increase exercise as tolerated.       TOBACCO USER    Encouraged complete cessation. Discussed need to quit as relates to risk of numerous cancers, cardiac and pulmonary disease as well as neurologic complications. Counseled for greater than 3 minutes. She agrees to try nicotine replacement and to cut down.      Depression with anxiety    Continues to struggle with severe stress being the live in primary care provider for her very ill father with dementia. She had to go to the ER for a panic attack with SOB, tremors and fear. So she is increased to 3 Alprazolam a day and if that is not enough she can contact us to consider 4 a day for a short time. If she has a full panic attack on 3 a day she is instructed to take a 4th to avoid the ER and let us know. She agrees to restart Sertraline as well due to her ongoing stressors      Relevant Medications   ALPRAZolam (XANAX) 0.5 MG tablet   sertraline (ZOLOFT) 50 MG tablet   Rotator cuff tendonitis    Seen by sports med at [place], Dr Reynolds Bowl who recommended PT which is ordered today with [facility]      Bursitis of right knee    Seen at urgent care last week with knee pain, Encouraged moist heat or ice and gentle stretching as tolerated. May try NSAIDs and prescription meds as directed and report if symptoms worsen or seek immediate care       Other Visit Diagnoses    Bilateral shoulder pain, unspecified chronicity    -  Primary   Relevant Orders   Ambulatory referral to Physical Therapy   High risk medication use       Relevant Medications   ALPRAZolam (XANAX) 0.5 MG tablet      I have discontinued Jearld Lesch. Morss's diphenhydrAMINE and ondansetron. I have also changed her ALPRAZolam. Additionally, I am having her start on sertraline.  Meds ordered this encounter  Medications  . ALPRAZolam (XANAX) 0.5 MG tablet    Sig: Take 1 tablet (0.5 mg  total) by mouth 3 (three) times daily as needed for anxiety.    Dispense:  90 tablet    Refill:  2  . sertraline (ZOLOFT) 50 MG tablet    Sig: Take 1 tablet (50 mg total) by mouth daily.    Dispense:  30 tablet    Refill:  3    CMA served as scribe during this visit. History, Physical and Plan performed by medical provider. Documentation and orders reviewed and attested to.  Danise Edge, MD

## 2018-02-07 MED FILL — ALPRAZolam 0.5 MG TABS: 0.5 | 30 days supply | Qty: 90 | Fill #0

## 2018-02-08 ENCOUNTER — Encounter: Payer: Self-pay | Admitting: Family Medicine

## 2018-02-09 ENCOUNTER — Telehealth: Payer: Self-pay | Admitting: *Deleted

## 2018-02-09 ENCOUNTER — Other Ambulatory Visit: Payer: Self-pay | Admitting: Family Medicine

## 2018-02-09 MED ORDER — SERTRALINE HCL 100 MG PO TABS: 100.0000 mg | ORAL_TABLET | Freq: Every day | ORAL | 1 refills | Status: DC

## 2018-02-09 MED FILL — SERTRALINE HCL 100 MG TAB: 100 | 30 days supply | Qty: 30 | Fill #0

## 2018-02-09 NOTE — Telephone Encounter (Signed)
[facility] forwarded to provider/SLS 06/20

## 2018-03-08 MED FILL — SERTRALINE HCL 100 MG TAB: 100 | 30 days supply | Qty: 30 | Fill #1

## 2018-03-08 MED FILL — ALPRAZolam 0.5 MG TABS: 0.5 | 30 days supply | Qty: 90 | Fill #1

## 2018-03-09 ENCOUNTER — Ambulatory Visit: Payer: BLUE CROSS/BLUE SHIELD | Admitting: Family Medicine

## 2018-03-09 ENCOUNTER — Encounter: Payer: Self-pay | Admitting: Family Medicine

## 2018-03-09 VITALS — BP 118/80 | HR 60 | Temp 97.9°F | Resp 18 | Wt 158.4 lb

## 2018-03-09 DIAGNOSIS — M25562 Pain in left knee: Secondary | ICD-10-CM

## 2018-03-09 DIAGNOSIS — M25561 Pain in right knee: Secondary | ICD-10-CM

## 2018-03-09 DIAGNOSIS — M25512 Pain in left shoulder: Secondary | ICD-10-CM | POA: Diagnosis not present

## 2018-03-09 DIAGNOSIS — F17200 Nicotine dependence, unspecified, uncomplicated: Secondary | ICD-10-CM | POA: Diagnosis not present

## 2018-03-09 DIAGNOSIS — G8929 Other chronic pain: Secondary | ICD-10-CM

## 2018-03-09 DIAGNOSIS — E669 Obesity, unspecified: Secondary | ICD-10-CM

## 2018-03-09 DIAGNOSIS — M25511 Pain in right shoulder: Secondary | ICD-10-CM

## 2018-03-09 DIAGNOSIS — Z79899 Other long term (current) drug therapy: Secondary | ICD-10-CM | POA: Diagnosis not present

## 2018-03-09 DIAGNOSIS — E1169 Type 2 diabetes mellitus with other specified complication: Secondary | ICD-10-CM

## 2018-03-09 LAB — COMPREHENSIVE METABOLIC PANEL
ALT: 12 U/L (ref 0–35)
AST: 13 U/L (ref 0–37)
Albumin: 4.1 g/dL (ref 3.5–5.2)
Alkaline Phosphatase: 47 U/L (ref 39–117)
BUN: 14 mg/dL (ref 6–23)
CHLORIDE: 104 meq/L (ref 96–112)
CO2: 30 mEq/L (ref 19–32)
CREATININE: 0.85 mg/dL (ref 0.40–1.20)
Calcium: 9.1 mg/dL (ref 8.4–10.5)
GFR: 73.32 mL/min (ref >60.00)
Glucose, Bld: 119 mg/dL — ABNORMAL HIGH (ref 70–99)
Potassium: 4.2 mEq/L (ref 3.5–5.1)
Sodium: 138 mEq/L (ref 135–145)
TOTAL PROTEIN: 6.7 g/dL (ref 6.0–8.3)
Total Bilirubin: 0.4 mg/dL (ref 0.2–1.2)

## 2018-03-09 LAB — HEMOGLOBIN A1C: Hgb A1c MFr Bld: 6.7 % — ABNORMAL HIGH (ref 4.6–6.5)

## 2018-03-09 MED ORDER — ALPRAZOLAM 0.5 MG PO TABS: 0.5000 mg | ORAL_TABLET | Freq: Four times a day (QID) | ORAL | 3 refills | Status: DC | PRN

## 2018-03-09 MED ORDER — SERTRALINE HCL 100 MG PO TABS: 100.0000 mg | ORAL_TABLET | Freq: Every day | ORAL | 5 refills | Status: DC

## 2018-03-09 NOTE — Patient Instructions (Signed)
Lidocaine gel to jaw Jaw Dislocation Jaw dislocation is displacement of the joint at the place where the upper jawbone (maxilla) and the lower jawbone (mandible) meet (temporomandibular joint). Soon after the dislocation, the jaw muscles tighten. This makes it difficult to close the mouth normally. This may also cause pain in the jaw. What are the causes? Common causes of this condition include:  A hard, direct hit or injury (trauma) to the jaw.  Opening the mouth too wide. This can be caused by: ? Eating. ? Yawning. ? Vomiting. ? Having a dental procedure. ? Receiving medicine by mouth to make you fall asleep (general anesthetic).  What increases the risk? This condition is more likely to happen to people:  Who have dislocated their jaws before.  Who participate in contact sports.  Who have a jaw that is loose or can move beyond the normal range.  What are the signs or symptoms? Symptoms of this condition vary depending on the severity of the dislocation. Symptoms may include:  Severe pain.  Inability to move your jaw or close your mouth completely.  Feeling that your teeth do not line up as they usually do when you bite.  Drooling.  Difficulty speaking or swallowing.  How is this diagnosed? This condition is diagnosed based on your medical history and a physical exam. Your health care provider will ask you to move your jaw, and he or she will feel your temporomandibular joints. Your health care provider will also check the inside of your mouth for:  Breaks (fractures) in your jaw bone.  Cuts (lacerations).  How is this treated? The most common treatment for this condition is to have your health care provider move your jaw back into place by hand (manual reduction). After doing a manual reduction, your health care provider may apply a special type of bandage (Barton bandage) to stabilize your jaw while it heals. Your health care provider may recommend:  NSAIDs to  reduce pain and swelling.  Medicines to relax your muscles.  A neck brace to support your mandible.  Follow these instructions at home: Injury care  If you were given a neck brace or a Barton bandage, wear it as told by your health care provider.  If directed, apply ice to the injured area: ? Put ice in a plastic bag. ? Place a towel between your skin and the bag. ? Leave the ice on for 20 minutes, 2-3 times per day. General instructions  Take over-the-counter and prescription medicines only as told by your health care provider.  Rest your jaw as told by your health care provider.  Avoid activities similar to the one that caused your injury.  Do not open your mouth wide until your health care provider says that it is safe for you to do that.  If you need to sneeze or yawn, place a hand under your jaw to prevent it from opening too wide.  Follow instructions from your health care provider about eating or drinking restrictions while your jaw is healing.These may include: ? Eating soft foods. ? Eating liquefied foods. ? Eating food that has been cut into small pieces.  Keep all follow-up visits as told by your health care provider. This is important. Contact a health care provider if:  Your pain gets worse or it does not get better with medicines. Get help right away if:  Your jaw becomes dislocated again.  You have difficulty breathing. This information is not intended to replace advice given to you  by your health care provider. Make sure you discuss any questions you have with your health care provider. Document Released: 08/06/2000 Document Revised: 01/15/2016 Document Reviewed: 01/06/2015 Elsevier Interactive Patient Education  2018 Elsevier Inc.  Shoulder Pain Many things can cause shoulder pain, including:  An injury.  Moving the arm in the same way again and again (overuse).  Joint pain (arthritis).  Follow these instructions at home: Take these actions to  help with your pain:  Squeeze a soft ball or a foam pad as much as you can. This helps to prevent swelling. It also makes the arm stronger.  Take over-the-counter and prescription medicines only as told by your doctor.  If told, put ice on the area: ? Put ice in a plastic bag. ? Place a towel between your skin and the bag. ? Leave the ice on for 20 minutes, 2-3 times per day. Stop putting on ice if it does not help with the pain.  If you were given a shoulder sling or immobilizer: ? Wear it as told. ? Remove it to shower or bathe. ? Move your arm as little as possible. ? Keep your hand moving. This helps prevent swelling.  Contact a doctor if:  Your pain gets worse.  Medicine does not help your pain.  You have new pain in your arm, hand, or fingers. Get help right away if:  Your arm, hand, or fingers: ? Tingle. ? Are numb. ? Are swollen. ? Are painful. ? Turn white or blue. This information is not intended to replace advice given to you by your health care provider. Make sure you discuss any questions you have with your health care provider. Document Released: 01/26/2008 Document Revised: 04/04/2016 Document Reviewed: 12/02/2014 Elsevier Interactive Patient Education  [employer]2018 Elsevier Inc.

## 2018-03-11 ENCOUNTER — Encounter: Payer: Self-pay | Admitting: Family Medicine

## 2018-03-12 DIAGNOSIS — M25511 Pain in right shoulder: Secondary | ICD-10-CM | POA: Insufficient documentation

## 2018-03-12 NOTE — Assessment & Plan Note (Signed)
hgba1c acceptable, minimize simple carbs. Increase exercise as tolerated. Continue current meds 

## 2018-03-12 NOTE — Progress Notes (Signed)
Subjective:    Patient ID: Ashley Jackson, female    DOB: Feb 03, 1961, 57 y.o.   MRN: [id number]  No chief complaint on file.   HPI Patient is in today for follow-up and she continues to be the primary care provider for her failing and declining father.  Her brother and sister-in-law were helping her finally but then her sister-in-law had a stroke so she is back to being the sole care provider.  She endorses significant anhedonia and fatigue.  She gets poor sleep.  She feels she is managing the stress better but her greatest concern today is pain.  She struggles with bilateral shoulder pain as well as bilateral knee pain in her shoulders have been escalating recently.  She denies any new trauma or falls.Denies CP/palp/SOB/HA/congestion/fevers/GI or GU c/o. Taking meds as prescribed  Past Medical History:  Diagnosis Date  . Allergic state 01/24/2017  . Anxiety state, unspecified 07/12/2007  . COLONIC POLYPS, HX OF 05/02/2007  . DEPRESSION 07/12/2007  . DIABETES MELLITUS, TYPE II, UNCONTROLLED 02/25/2010  . EXOGENOUS OBESITY 11/07/2007  . Hematuria 06/03/2017  . HYPERGLYCEMIA 07/12/2007  . HYPERLIPIDEMIA 02/07/2008  . Low back pain 04/29/2013  . Overweight(278.02) 06/24/2010  . Palpitations 01/20/2016  . Preventative health care 04/29/2013  . Tobacco abuse 10/24/2012  . TOBACCO USER 06/24/2010    Past Surgical History:  Procedure Laterality Date  . COLONOSCOPY  2006    Family History  Problem Relation Age of Onset  . Liver cancer Mother   . Hypertension Other   . Hypertension Father   . Stroke Brother   . Seizures Son   . Diabetes Maternal Uncle   . Breast cancer Neg Hx   . Prostate cancer Neg Hx   . Heart disease Neg Hx     Social History   Socioeconomic History  . Marital status: Divorced    Spouse name: Not on file  . Number of children: Not on file  . Years of education: Not on file  . Highest education level: Not on file  Occupational History  . Not on file  Social Needs  .  Financial resource strain: Not on file  . Food insecurity:    Worry: Not on file    Inability: Not on file  . Transportation needs:    Medical: Not on file    Non-medical: Not on file  Tobacco Use  . Smoking status: Current Every Day Smoker    Packs/day: 1.00  . Smokeless tobacco: Never Used  Substance and Sexual Activity  . Alcohol use: Not on file  . Drug use: No  . Sexual activity: Not Currently  Lifestyle  . Physical activity:    Days per week: Not on file    Minutes per session: Not on file  . Stress: Not on file  Relationships  . Social connections:    Talks on phone: Not on file    Gets together: Not on file    Attends religious service: Not on file    Active member of club or organization: Not on file    Attends meetings of clubs or organizations: Not on file    Relationship status: Not on file  . Intimate partner violence:    Fear of current or ex partner: Not on file    Emotionally abused: Not on file    Physically abused: Not on file    Forced sexual activity: Not on file  Other Topics Concern  . Not on file  Social History  Narrative  . Not on file    Outpatient Medications Prior to Visit  Medication Sig Dispense Refill  . ALPRAZolam (XANAX) 0.5 MG tablet Take 1 tablet (0.5 mg total) by mouth 3 (three) times daily as needed for anxiety. 90 tablet 2  . sertraline (ZOLOFT) 100 MG tablet Take 1 tablet (100 mg total) by mouth daily. 30 tablet 1   No facility-administered medications prior to visit.     Allergies  Allergen Reactions  . Atarax [Hydroxyzine] Shortness Of Breath    Anxiety, shortness of breath, tachycardia   . Levofloxacin     anxiety  . Sulfa Antibiotics     Panic attacks  . Sulfonamide Derivatives     REACTION: Panic attacks  . Levofloxacin Anxiety    Review of Systems  Constitutional: Positive for malaise/fatigue. Negative for fever.  HENT: Negative for congestion.   Eyes: Negative for blurred vision.  Respiratory: Negative for  shortness of breath.   Cardiovascular: Negative for chest pain, palpitations and leg swelling.  Gastrointestinal: Negative for abdominal pain, blood in stool and nausea.  Genitourinary: Negative for dysuria and frequency.  Musculoskeletal: Positive for joint pain. Negative for falls.  Skin: Negative for rash.  Neurological: Negative for dizziness, loss of consciousness and headaches.  Endo/Heme/Allergies: Negative for environmental allergies.  Psychiatric/Behavioral: Negative for depression. The patient is nervous/anxious.        Objective:    Physical Exam  Constitutional: She is oriented to person, place, and time. She appears well-developed and well-nourished. No distress.  HENT:  Head: Normocephalic and atraumatic.  Nose: Nose normal.  Eyes: Right eye exhibits no discharge. Left eye exhibits no discharge.  Neck: Normal range of motion. Neck supple.  Cardiovascular: Normal rate and regular rhythm.  No murmur heard. Pulmonary/Chest: Effort normal and breath sounds normal.  Abdominal: Soft. Bowel sounds are normal. There is no tenderness.  Musculoskeletal: She exhibits no edema.  Neurological: She is alert and oriented to person, place, and time.  Skin: Skin is warm and dry.  Psychiatric: She has a normal mood and affect.  Nursing note and vitals reviewed.   BP 118/80 (BP Location: Left Arm, Patient Position: Sitting, Cuff Size: Normal)   Pulse 60   Temp 97.9 F (36.6 C) (Oral)   Resp 18   Wt 158 lb 6.4 oz (71.8 kg)   LMP 12/09/2015   SpO2 98%   BMI 24.81 kg/m  Wt Readings from Last 3 Encounters:  03/09/18 158 lb 6.4 oz (71.8 kg)  01/24/18 158 lb 3.2 oz (71.8 kg)  12/08/17 162 lb (73.5 kg)     Lab Results  Component Value Date   WBC 6.7 01/24/2017   HGB 15.0 01/24/2017   HCT 44.4 01/24/2017   PLT 281.0 01/24/2017   GLUCOSE 119 (H) 03/09/2018   CHOL 184 10/19/2016   TRIG 171.0 (H) 10/19/2016   HDL 49.50 10/19/2016   LDLDIRECT 131.3 09/30/2010   LDLCALC 100  (H) 10/19/2016   ALT 12 03/09/2018   AST 13 03/09/2018   NA 138 03/09/2018   K 4.2 03/09/2018   CL 104 03/09/2018   CREATININE 0.85 03/09/2018   BUN 14 03/09/2018   CO2 30 03/09/2018   TSH 1.31 01/24/2017   HGBA1C 6.7 (H) 03/09/2018   MICROALBUR 0.72 10/20/2012    Lab Results  Component Value Date   TSH 1.31 01/24/2017   Lab Results  Component Value Date   WBC 6.7 01/24/2017   HGB 15.0 01/24/2017   HCT 44.4 01/24/2017  MCV 91.7 01/24/2017   PLT 281.0 01/24/2017   Lab Results  Component Value Date   NA 138 03/09/2018   K 4.2 03/09/2018   CO2 30 03/09/2018   GLUCOSE 119 (H) 03/09/2018   BUN 14 03/09/2018   CREATININE 0.85 03/09/2018   BILITOT 0.4 03/09/2018   ALKPHOS 47 03/09/2018   AST 13 03/09/2018   ALT 12 03/09/2018   PROT 6.7 03/09/2018   ALBUMIN 4.1 03/09/2018   CALCIUM 9.1 03/09/2018   GFR 73.32 03/09/2018   Lab Results  Component Value Date   CHOL 184 10/19/2016   Lab Results  Component Value Date   HDL 49.50 10/19/2016   Lab Results  Component Value Date   LDLCALC 100 (H) 10/19/2016   Lab Results  Component Value Date   TRIG 171.0 (H) 10/19/2016   Lab Results  Component Value Date   CHOLHDL 4 10/19/2016   Lab Results  Component Value Date   HGBA1C 6.7 (H) 03/09/2018       Assessment & Plan:   Problem List Items Addressed This Visit    Diabetes mellitus type 2 in obese (HCC)    hgba1c acceptable, minimize simple carbs. Increase exercise as tolerated. Continue current meds      Relevant Orders   Hemoglobin A1c (Completed)   Comprehensive metabolic panel (Completed)   TOBACCO USER    Encouraged complete cessation. Discussed need to quit as relates to risk of numerous cancers, cardiac and pulmonary disease as well as neurologic complications. Counseled for greater than 3 minutes      Chronic pain of both shoulders - Primary    Referred to orthopaedics for futhe consideration. Encouraged moist heat and gentle stretching as  tolerated. May try NSAIDs and prescription meds as directed and report if symptoms worsen or seek immediate care      Relevant Orders   Ambulatory referral to Orthopedic Surgery    Other Visit Diagnoses    High risk medication use       Relevant Medications   ALPRAZolam (XANAX) 0.5 MG tablet   Pain in both knees, unspecified chronicity       Relevant Orders   Ambulatory referral to Orthopedic Surgery      I have changed Jearld Lesch. Schrack's ALPRAZolam. I am also having her maintain her sertraline.  Meds ordered this encounter  Medications  . sertraline (ZOLOFT) 100 MG tablet    Sig: Take 1 tablet (100 mg total) by mouth daily.    Dispense:  30 tablet    Refill:  5  . ALPRAZolam (XANAX) 0.5 MG tablet    Sig: Take 1 tablet (0.5 mg total) by mouth 4 (four) times daily as needed for anxiety.    Dispense:  120 tablet    Refill:  3     Danise Edge, MD

## 2018-03-12 NOTE — Assessment & Plan Note (Signed)
Referred to orthopaedics for futhe consideration. Encouraged moist heat and gentle stretching as tolerated. May try NSAIDs and prescription meds as directed and report if symptoms worsen or seek immediate care

## 2018-03-12 NOTE — Assessment & Plan Note (Signed)
Encouraged complete cessation. Discussed need to quit as relates to risk of numerous cancers, cardiac and pulmonary disease as well as neurologic complications. Counseled for greater than 3 minutes 

## 2018-03-27 ENCOUNTER — Telehealth: Payer: Self-pay | Admitting: *Deleted

## 2018-03-27 NOTE — Telephone Encounter (Signed)
[facility]; forwarded to provider/SLS 08/05

## 2018-03-30 ENCOUNTER — Telehealth: Payer: Self-pay | Admitting: *Deleted

## 2018-03-30 NOTE — Telephone Encounter (Signed)
[facility]; forwarded to provider/SLS 08/08

## 2018-04-10 MED FILL — SERTRALINE HCL 100 MG TAB: 100 | 30 days supply | Qty: 30 | Fill #0

## 2018-04-10 MED FILL — ALPRAZolam 0.5 MG TABS: 0.5 | 30 days supply | Qty: 90 | Fill #2

## 2018-04-17 ENCOUNTER — Ambulatory Visit (INDEPENDENT_AMBULATORY_CARE_PROVIDER_SITE_OTHER): Payer: BLUE CROSS/BLUE SHIELD | Admitting: Orthopaedic Surgery

## 2018-04-17 ENCOUNTER — Ambulatory Visit (INDEPENDENT_AMBULATORY_CARE_PROVIDER_SITE_OTHER): Payer: Self-pay

## 2018-04-17 ENCOUNTER — Encounter (INDEPENDENT_AMBULATORY_CARE_PROVIDER_SITE_OTHER): Payer: Self-pay | Admitting: Orthopaedic Surgery

## 2018-04-17 VITALS — BP 144/74 | HR 56 | Ht 63.0 in | Wt 155.0 lb

## 2018-04-17 DIAGNOSIS — M25512 Pain in left shoulder: Secondary | ICD-10-CM | POA: Diagnosis not present

## 2018-04-17 DIAGNOSIS — M25511 Pain in right shoulder: Secondary | ICD-10-CM | POA: Diagnosis not present

## 2018-04-17 DIAGNOSIS — M25561 Pain in right knee: Secondary | ICD-10-CM

## 2018-04-17 DIAGNOSIS — G8929 Other chronic pain: Secondary | ICD-10-CM | POA: Diagnosis not present

## 2018-04-17 NOTE — Progress Notes (Signed)
Office Visit Note   Patient: Ashley Jackson           Date of Birth: 1960/12/18           MRN: [id number] Visit Date: 04/17/2018              Requested by: Bradd CanaryBlyth, Stacey A, MD [address] [place], [place] [id number] PCP: Bradd CanaryBlyth, Stacey A, MD   Assessment & Plan: Visit Diagnoses:  1. Acute pain of right knee   2. Chronic left shoulder pain   3. Chronic right shoulder pain     Plan: Bilateral shoulder impingement syndrome.  Long discussion with Ashley Jackson regarding the diagnosis.  She has already had a cortisone injection in the left more symptomatic shoulder without much relief.  She is hesitant to consider an MRI scan.  She is involved with physical therapy and will continue with that.  I talked about using Tylenol and NSAIDs.  Mild osteoarthritis right knee which I suspect is what is causing her pain. Long discussion regarding her shoulders and her knee and different treatment options.  Also discussed MRI scans of both of her shoulders.  Total time spent was approximately 45 minutes 50% of the time in counseling.  He wants to continue with conservative treatment and will let me know if she like to proceed with further cortisone in either shoulder or her right knee or consider MRI scan  Follow-Up Instructions: No follow-ups on file.   Orders:  Orders Placed This Encounter  Procedures  . XR KNEE 3 VIEW RIGHT  . XR Shoulder Left  . XR Shoulder Right   No orders of the defined types were placed in this encounter.     Procedures: No procedures performed   Clinical Data: No additional findings.   Subjective: Chief Complaint  Patient presents with  . New Patient (Initial Visit)    BI LAT SHOULDER PAIN OFF AND ON 1 YR GOTTEN WORSE SINCE FEB.. NO INJURY HAD L SHOULDER CORTISONE INJECTION IN JUNE. SEEN WIC DR LUE TOLD BURSTIS TENDINITIS, HAS POPPING AND CATCHES IN LEFT SHOULDER, RIGHT SHOULDER IS NOT AS BAD.Marland Kitchen.FOLLOWED UP WITH PCP WHO REFERRED HER HERE. RIGHT KNEE  PAIN FOR 2 MO FEELS LIKE KNEES ON FIRE EVERY YONCE IN  AWHILE NO INJURY   Ashley Jackson is presently caring for her elderly and infirmed father.  He has a diagnosis of Alzheimer's.  She is now dedicating her life to his care.  She is "quit my job and sold my home.  She admits to being depressed about 2 years's present status thinks that there may be some impact with the above on her shoulders and knee.  She is had insidious onset of bilateral shoulder pain within the past year.  No injury or trauma.  She is having more trouble on the left and is on the right.  She is hesitant to consider medicine because of the potential side effects but has tried Tylenol with some relief.  Her primary care physician injected cortisone in the left shoulder notes that it did not really "helped much".  She is having some difficulty raising her arm over her head.  No neck pain.  No numbness or tingling.  Some referred pain into both biceps.  Again having more trouble on the left than the right.  No ecchymosis or increased heat.  Right knee pain is been very relatively mild but she is concerned about the impact might be over time.  Occasionally  will have some swelling in the morning and a feeling of stiffness.  HPI  Review of Systems  Constitutional: Negative for fatigue and fever.  HENT: Negative for ear pain.   Eyes: Negative for pain.  Respiratory: Negative for cough and shortness of breath.   Cardiovascular: Negative for leg swelling.  Gastrointestinal: Negative for constipation and diarrhea.  Genitourinary: Negative for difficulty urinating.  Musculoskeletal: Negative for back pain and neck pain.  Skin: Negative for rash.  Allergic/Immunologic: Negative for food allergies.  Neurological: Positive for weakness. Negative for numbness.  Hematological: Does not bruise/bleed easily.  Psychiatric/Behavioral: Positive for sleep disturbance.     Objective: Vital Signs: BP (!) 144/74 (BP Location: Left Arm, Patient  Position: Sitting, Cuff Size: Normal)   Pulse (!) 56   Ht 5\' 3"  (1.6 m)   Wt 155 lb (70.3 kg)   LMP 12/09/2015   BMI 27.46 kg/m   Physical Exam  Constitutional: She is oriented to person, place, and time. She appears well-developed and well-nourished.  HENT:  Mouth/Throat: Oropharynx is clear and moist.  Eyes: Pupils are equal, round, and reactive to light. EOM are normal.  Pulmonary/Chest: Effort normal.  Neurological: She is alert and oriented to person, place, and time.  Skin: Skin is warm and dry.  Psychiatric: She has a normal mood and affect. Her behavior is normal.    Ortho Exam awake alert and oriented x3.  Comfortable sitting.  Positive impingement of both shoulders.  More so on the left than the right.  Positive empty can testing on the left and not the right.  Able to place both arms over her head with a circuitous motion.  Has full motion of both shoulders and able to touch the middle of her back bilaterally.  Good grip and good release.  Skin intact.  Biceps appear to be intact.  Mild anterior lateral subacromial tenderness both shoulders.  No pain with range of motion. Right knee without effusion.  No significant tenderness along either medial lateral joint.  Minimal patellar crepitation.  No popliteal fullness.  No distal edema.  Full extension and flexion over 105 degrees without instability.  Specialty Comments:  No specialty comments available.  Imaging: No results found.   PMFS History: Patient Active Problem List   Diagnosis Date Noted  . Chronic pain of both shoulders 03/12/2018  . Rotator cuff tendonitis 01/24/2018  . Bursitis of right knee 01/24/2018  . Hematuria 06/03/2017  . Allergic state 01/24/2017  . Palpitations 01/20/2016  . Preventative health care 04/29/2013  . Low back pain 04/29/2013  . Elevated blood pressure 12/01/2011  . TOBACCO USER 06/24/2010  . Diabetes mellitus type 2 in obese (HCC) 02/25/2010  . Hyperlipidemia, mixed 02/07/2008  .  Obesity 11/07/2007  . Depression with anxiety 07/12/2007  . COLONIC POLYPS, HX OF 05/02/2007   Past Medical History:  Diagnosis Date  . Allergic state 01/24/2017  . Anxiety state, unspecified 07/12/2007  . COLONIC POLYPS, HX OF 05/02/2007  . DEPRESSION 07/12/2007  . DIABETES MELLITUS, TYPE II, UNCONTROLLED 02/25/2010  . EXOGENOUS OBESITY 11/07/2007  . Hematuria 06/03/2017  . HYPERGLYCEMIA 07/12/2007  . HYPERLIPIDEMIA 02/07/2008  . Low back pain 04/29/2013  . Overweight(278.02) 06/24/2010  . Palpitations 01/20/2016  . Preventative health care 04/29/2013  . Tobacco abuse 10/24/2012  . TOBACCO USER 06/24/2010    Family History  Problem Relation Age of Onset  . Liver cancer Mother   . Hypertension Other   . Hypertension Father   . Stroke Brother   .  Seizures Son   . Diabetes Maternal Uncle   . Breast cancer Neg Hx   . Prostate cancer Neg Hx   . Heart disease Neg Hx     Past Surgical History:  Procedure Laterality Date  . COLONOSCOPY  2006   Social History   Occupational History  . Not on file  Tobacco Use  . Smoking status: Current Every Day Smoker    Packs/day: 1.00  . Smokeless tobacco: Never Used  Substance and Sexual Activity  . Alcohol use: Not Currently  . Drug use: No  . Sexual activity: Not Currently

## 2018-05-05 ENCOUNTER — Telehealth: Payer: Self-pay | Admitting: *Deleted

## 2018-05-05 NOTE — Telephone Encounter (Signed)
[facility]; forwarded to provider/SLS 09/13

## 2018-05-10 MED FILL — SERTRALINE HCL 100 MG TAB: 100 | 30 days supply | Qty: 30 | Fill #1

## 2018-05-10 MED FILL — ALPRAZolam 0.5 MG TABS: 0.5 | 30 days supply | Qty: 120 | Fill #0

## 2018-06-09 MED FILL — SERTRALINE HCL 100 MG TAB: 100 | 30 days supply | Qty: 30 | Fill #2

## 2018-06-09 MED FILL — ALPRAZolam 0.5 MG TABS: 0.5 | 30 days supply | Qty: 120 | Fill #1

## 2018-07-04 ENCOUNTER — Inpatient Hospital Stay: Payer: BLUE CROSS/BLUE SHIELD | Admitting: Family Medicine

## 2018-07-07 ENCOUNTER — Ambulatory Visit (INDEPENDENT_AMBULATORY_CARE_PROVIDER_SITE_OTHER): Payer: Self-pay

## 2018-07-07 ENCOUNTER — Encounter (INDEPENDENT_AMBULATORY_CARE_PROVIDER_SITE_OTHER): Payer: Self-pay | Admitting: Orthopaedic Surgery

## 2018-07-07 ENCOUNTER — Ambulatory Visit (INDEPENDENT_AMBULATORY_CARE_PROVIDER_SITE_OTHER): Payer: BLUE CROSS/BLUE SHIELD | Admitting: Orthopaedic Surgery

## 2018-07-07 VITALS — Ht 63.0 in | Wt 155.0 lb

## 2018-07-07 DIAGNOSIS — M545 Low back pain, unspecified: Secondary | ICD-10-CM

## 2018-07-07 DIAGNOSIS — M542 Cervicalgia: Secondary | ICD-10-CM

## 2018-07-07 DIAGNOSIS — M25512 Pain in left shoulder: Secondary | ICD-10-CM

## 2018-07-07 DIAGNOSIS — M25511 Pain in right shoulder: Secondary | ICD-10-CM | POA: Diagnosis not present
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
## 2018-07-10 MED FILL — SERTRALINE HCL 100 MG TAB: 100 | 30 days supply | Qty: 30 | Fill #3

## 2018-07-10 MED FILL — ALPRAZolam 0.5 MG TABS: 0.5 | 30 days supply | Qty: 120 | Fill #2

## 2018-07-14 ENCOUNTER — Telehealth (INDEPENDENT_AMBULATORY_CARE_PROVIDER_SITE_OTHER): Payer: Self-pay

## 2018-07-14 NOTE — Telephone Encounter (Signed)
Physical therapy office calling asking for order to be faxed to office. Fax # [phone]

## 2018-07-14 NOTE — Telephone Encounter (Signed)
Faxed order

## 2018-08-01 ENCOUNTER — Ambulatory Visit (INDEPENDENT_AMBULATORY_CARE_PROVIDER_SITE_OTHER): Payer: BLUE CROSS/BLUE SHIELD | Admitting: Orthopaedic Surgery

## 2018-08-03 ENCOUNTER — Encounter (INDEPENDENT_AMBULATORY_CARE_PROVIDER_SITE_OTHER): Payer: Self-pay | Admitting: Orthopaedic Surgery

## 2018-08-03 ENCOUNTER — Ambulatory Visit (INDEPENDENT_AMBULATORY_CARE_PROVIDER_SITE_OTHER): Payer: BLUE CROSS/BLUE SHIELD | Admitting: Orthopaedic Surgery

## 2018-08-03 DIAGNOSIS — G8929 Other chronic pain: Secondary | ICD-10-CM | POA: Diagnosis not present

## 2018-08-03 DIAGNOSIS — M25512 Pain in left shoulder: Secondary | ICD-10-CM | POA: Diagnosis not present

## 2018-08-03 DIAGNOSIS — M25511 Pain in right shoulder: Secondary | ICD-10-CM

## 2018-08-03 NOTE — Addendum Note (Signed)
Addended by: Albertina ParrGARCIA, Jesseka Drinkard on: 08/03/2018 11:36 AM   Modules accepted: Orders
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
## 2018-08-08 MED FILL — ALPRAZolam 0.5 MG TABS: 0.5 | 30 days supply | Qty: 120 | Fill #3

## 2018-08-08 MED FILL — SERTRALINE HCL 100 MG TAB: 100 | 30 days supply | Qty: 30 | Fill #4

## 2018-08-21 ENCOUNTER — Ambulatory Visit (INDEPENDENT_AMBULATORY_CARE_PROVIDER_SITE_OTHER): Payer: BLUE CROSS/BLUE SHIELD

## 2018-08-21 DIAGNOSIS — G8929 Other chronic pain: Secondary | ICD-10-CM

## 2018-08-21 DIAGNOSIS — M25512 Pain in left shoulder: Secondary | ICD-10-CM | POA: Diagnosis not present

## 2018-08-21 DIAGNOSIS — M25511 Pain in right shoulder: Principal | ICD-10-CM

## 2018-08-22 ENCOUNTER — Ambulatory Visit (INDEPENDENT_AMBULATORY_CARE_PROVIDER_SITE_OTHER): Payer: BLUE CROSS/BLUE SHIELD | Admitting: Orthopaedic Surgery

## 2018-08-22 ENCOUNTER — Encounter (INDEPENDENT_AMBULATORY_CARE_PROVIDER_SITE_OTHER): Payer: Self-pay | Admitting: Orthopaedic Surgery

## 2018-08-22 DIAGNOSIS — M75101 Unspecified rotator cuff tear or rupture of right shoulder, not specified as traumatic: Secondary | ICD-10-CM | POA: Diagnosis not present

## 2018-08-22 DIAGNOSIS — M75102 Unspecified rotator cuff tear or rupture of left shoulder, not specified as traumatic: Secondary | ICD-10-CM | POA: Diagnosis not present
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
## 2018-09-11 ENCOUNTER — Ambulatory Visit: Payer: BLUE CROSS/BLUE SHIELD | Admitting: Family Medicine

## 2018-09-14 ENCOUNTER — Ambulatory Visit: Payer: PRIVATE HEALTH INSURANCE | Admitting: Family Medicine

## 2018-09-14 VITALS — BP 124/76 | HR 69 | Temp 97.7°F | Resp 18 | Wt 162.6 lb

## 2018-09-14 DIAGNOSIS — F418 Other specified anxiety disorders: Secondary | ICD-10-CM | POA: Diagnosis not present

## 2018-09-14 DIAGNOSIS — Z79899 Other long term (current) drug therapy: Secondary | ICD-10-CM | POA: Diagnosis not present

## 2018-09-14 DIAGNOSIS — F17200 Nicotine dependence, unspecified, uncomplicated: Secondary | ICD-10-CM

## 2018-09-14 DIAGNOSIS — E669 Obesity, unspecified: Secondary | ICD-10-CM | POA: Diagnosis not present

## 2018-09-14 DIAGNOSIS — E1169 Type 2 diabetes mellitus with other specified complication: Secondary | ICD-10-CM

## 2018-09-14 LAB — HEMOGLOBIN A1C: Hgb A1c MFr Bld: 6.7 % — ABNORMAL HIGH (ref 4.6–6.5)

## 2018-09-14 MED ORDER — ALPRAZOLAM 0.5 MG PO TABS: 0.5000 mg | ORAL_TABLET | Freq: Four times a day (QID) | ORAL | 3 refills | Status: DC | PRN

## 2018-09-14 MED ORDER — SERTRALINE HCL 100 MG PO TABS: 100.0000 mg | ORAL_TABLET | Freq: Every day | ORAL | 5 refills | Status: DC

## 2018-09-14 NOTE — Assessment & Plan Note (Signed)
hgba1c acceptable, minimize simple carbs. Increase exercise as tolerated. Continue current meds 

## 2018-09-14 NOTE — Assessment & Plan Note (Signed)
Stable on Sertraline and Alprazolam, refills given, UDS given.
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
## 2018-09-14 NOTE — Patient Instructions (Signed)

## 2018-09-14 NOTE — Assessment & Plan Note (Signed)
Still smoking a ppd. Encouraged to cut down she is unwilling to quit.

## 2018-09-16 LAB — PAIN MGMT, PROFILE 8 W/CONF, U
6 Acetylmorphine: NEGATIVE ng/mL (ref <10)
ALPHAHYDROXYMIDAZOLAM: NEGATIVE ng/mL (ref <50)
Alcohol Metabolites: NEGATIVE ng/mL (ref <500)
Alphahydroxyalprazolam: 189 ng/mL — ABNORMAL HIGH (ref <25)
Alphahydroxytriazolam: NEGATIVE ng/mL (ref <50)
Aminoclonazepam: NEGATIVE ng/mL (ref <25)
Amphetamines: NEGATIVE ng/mL (ref <500)
Benzodiazepines: POSITIVE ng/mL — AB (ref <100)
Buprenorphine, Urine: NEGATIVE ng/mL (ref <5)
COCAINE METABOLITE: NEGATIVE ng/mL (ref <150)
Creatinine: 97.7 mg/dL
Hydroxyethylflurazepam: NEGATIVE ng/mL (ref <50)
Lorazepam: NEGATIVE ng/mL (ref <50)
MDMA: NEGATIVE ng/mL (ref <500)
Marijuana Metabolite: NEGATIVE ng/mL (ref <20)
Nordiazepam: NEGATIVE ng/mL (ref <50)
Opiates: NEGATIVE ng/mL (ref <100)
Oxazepam: NEGATIVE ng/mL (ref <50)
Oxidant: NEGATIVE ug/mL (ref <200)
Oxycodone: NEGATIVE ng/mL (ref <100)
Temazepam: NEGATIVE ng/mL (ref <50)
pH: 6.24 (ref 4.5–9.0)

## 2018-09-17 ENCOUNTER — Encounter: Payer: Self-pay | Admitting: Family Medicine

## 2018-11-12 ENCOUNTER — Encounter: Payer: Self-pay | Admitting: Family Medicine

## 2018-11-13 ENCOUNTER — Other Ambulatory Visit: Payer: Self-pay | Admitting: Family Medicine

## 2018-11-13 MED ORDER — GUAIFENESIN-CODEINE 100-10 MG/5ML PO SYRP: 5.0000 mL | ORAL_SOLUTION | Freq: Three times a day (TID) | ORAL | 0 refills | Status: DC | PRN

## 2018-11-21 ENCOUNTER — Other Ambulatory Visit: Payer: Self-pay

## 2018-11-21 ENCOUNTER — Ambulatory Visit (INDEPENDENT_AMBULATORY_CARE_PROVIDER_SITE_OTHER): Payer: PRIVATE HEALTH INSURANCE | Admitting: Family Medicine

## 2018-11-21 DIAGNOSIS — F17200 Nicotine dependence, unspecified, uncomplicated: Secondary | ICD-10-CM

## 2018-11-21 DIAGNOSIS — T7840XD Allergy, unspecified, subsequent encounter: Secondary | ICD-10-CM | POA: Diagnosis not present

## 2018-11-21 DIAGNOSIS — J40 Bronchitis, not specified as acute or chronic: Secondary | ICD-10-CM | POA: Insufficient documentation

## 2018-11-21 MED ORDER — AMOXICILLIN 500 MG PO CAPS: 500.0000 mg | ORAL_CAPSULE | Freq: Three times a day (TID) | ORAL | 0 refills | Status: DC

## 2018-11-21 MED ORDER — GUAIFENESIN-CODEINE 100-10 MG/5ML PO SYRP: 5.0000 mL | ORAL_SOLUTION | Freq: Three times a day (TID) | ORAL | 0 refills | Status: DC | PRN

## 2018-11-21 MED ORDER — ALBUTEROL SULFATE HFA 108 (90 BASE) MCG/ACT IN AERS: 2.0000 | INHALATION_SPRAY | Freq: Four times a day (QID) | RESPIRATORY_TRACT | 0 refills | Status: DC | PRN

## 2018-11-21 MED ORDER — GUAIFENESIN ER 600 MG PO TB12: 600.0000 mg | ORAL_TABLET | Freq: Two times a day (BID) | ORAL | 1 refills | Status: DC | PRN

## 2018-11-21 NOTE — Assessment & Plan Note (Signed)
Unfortunately she continues to smoke and she feels it is her best way to manage the stress of caring for her father with worsening dementia. She is encouraged to quit and warned that Covid is very tough on smokers so if there is anything we can do to help when she is ready to quit she should let us know

## 2018-11-21 NOTE — Assessment & Plan Note (Signed)
Daily antihistamine can be considered

## 2018-11-21 NOTE — Progress Notes (Signed)
Virtual Visit via Video Note  I connected with Ashley Jackson on 11/21/18 at 10:45 AM EDT by a video enabled telemedicine application and verified that I am speaking with the correct person using two identifiers.   I discussed the limitations of evaluation and management by telemedicine and the availability of in person appointments. The patient expressed understanding and agreed to proceed. Crissie Sickles, CMA was able to get patient set up on Webex from her home.    Subjective:    Patient ID: Ashley Jackson, female    DOB: 09/23/1960, 58 y.o.   MRN: [id number]  No chief complaint on file.   HPI Patient is in today via Webex for evaluation of respiratory illness. She has been ill for roughly 3 weeks. She notes it started more in her head and has moved more into a cough and her chest. She has a cough which disrupts her day and her sleep. The cough syrup only mildly helpfult but does make her sleep. No obvious fevers or chills but she does note feeling SOB especially with a coughing spell. Denies CP/palp/fevers/GI or GU c/o. Taking meds as prescribed  Past Medical History:  Diagnosis Date  . Allergic state 01/24/2017  . Anxiety state, unspecified 07/12/2007  . COLONIC POLYPS, HX OF 05/02/2007  . DEPRESSION 07/12/2007  . DIABETES MELLITUS, TYPE II, UNCONTROLLED 02/25/2010  . EXOGENOUS OBESITY 11/07/2007  . Hematuria 06/03/2017  . HYPERGLYCEMIA 07/12/2007  . HYPERLIPIDEMIA 02/07/2008  . Low back pain 04/29/2013  . Overweight(278.02) 06/24/2010  . Palpitations 01/20/2016  . Preventative health care 04/29/2013  . Tobacco abuse 10/24/2012  . TOBACCO USER 06/24/2010    Past Surgical History:  Procedure Laterality Date  . COLONOSCOPY  2006    Family History  Problem Relation Age of Onset  . Liver cancer Mother   . Hypertension Other   . Hypertension Father   . Stroke Brother   . Seizures Son   . Diabetes Maternal Uncle   . Breast cancer Neg Hx   . Prostate cancer Neg Hx   . Heart disease Neg Hx      Social History   Socioeconomic History  . Marital status: Divorced    Spouse name: Not on file  . Number of children: Not on file  . Years of education: Not on file  . Highest education level: Not on file  Occupational History  . Not on file  Social Needs  . Financial resource strain: Not on file  . Food insecurity:    Worry: Not on file    Inability: Not on file  . Transportation needs:    Medical: Not on file    Non-medical: Not on file  Tobacco Use  . Smoking status: Current Every Day Smoker    Packs/day: 1.00  . Smokeless tobacco: Never Used  Substance and Sexual Activity  . Alcohol use: Not Currently  . Drug use: No  . Sexual activity: Not Currently  Lifestyle  . Physical activity:    Days per week: Not on file    Minutes per session: Not on file  . Stress: Not on file  Relationships  . Social connections:    Talks on phone: Not on file    Gets together: Not on file    Attends religious service: Not on file    Active member of club or organization: Not on file    Attends meetings of clubs or organizations: Not on file    Relationship status: Not on file  .  Intimate partner violence:    Fear of current or ex partner: Not on file    Emotionally abused: Not on file    Physically abused: Not on file    Forced sexual activity: Not on file  Other Topics Concern  . Not on file  Social History Narrative  . Not on file    Outpatient Medications Prior to Visit  Medication Sig Dispense Refill  . ALPRAZolam (XANAX) 0.5 MG tablet Take 1 tablet (0.5 mg total) by mouth 4 (four) times daily as needed for anxiety. 120 tablet 3  . sertraline (ZOLOFT) 100 MG tablet Take 1 tablet (100 mg total) by mouth daily. 30 tablet 5  . guaiFENesin-codeine (VIRTUSSIN A/C) 100-10 MG/5ML syrup Take 5 mLs by mouth 3 (three) times daily as needed for cough. 150 mL 0   No facility-administered medications prior to visit.     Allergies  Allergen Reactions  . Atarax [Hydroxyzine]  Shortness Of Breath    Anxiety, shortness of breath, tachycardia   . Cortisone     HEART RACING  . Levofloxacin     anxiety  . Sulfa Antibiotics     Panic attacks  . Sulfonamide Derivatives     REACTION: Panic attacks  . Levofloxacin Anxiety    Review of Systems  Constitutional: Positive for malaise/fatigue. Negative for chills and fever.  HENT: Positive for congestion. Negative for ear pain and sore throat.   Eyes: Negative for blurred vision.  Respiratory: Positive for cough, sputum production, shortness of breath and wheezing.   Cardiovascular: Negative for chest pain, palpitations and leg swelling.  Gastrointestinal: Negative for abdominal pain, blood in stool and nausea.  Genitourinary: Negative for dysuria and frequency.  Musculoskeletal: Negative for falls.  Skin: Negative for rash.  Neurological: Negative for dizziness, loss of consciousness and headaches.  Endo/Heme/Allergies: Negative for environmental allergies.  Psychiatric/Behavioral: Negative for depression. The patient is not nervous/anxious.        Objective:    Physical Exam Vitals signs and nursing note reviewed.  Constitutional:      General: She is not in acute distress.    Appearance: Normal appearance. She is well-developed.  HENT:     Head: Normocephalic and atraumatic.     Nose: Nose normal.  Pulmonary:     Effort: Pulmonary effort is normal.  Neurological:     Mental Status: She is alert and oriented to person, place, and time.  Psychiatric:        Mood and Affect: Mood normal.        Behavior: Behavior normal.     LMP 12/09/2015  Wt Readings from Last 3 Encounters:  09/14/18 162 lb 9.6 oz (73.8 kg)  07/07/18 155 lb (70.3 kg)  04/17/18 155 lb (70.3 kg)    Diabetic Foot Exam - Simple   No data filed     Lab Results  Component Value Date   WBC 6.7 01/24/2017   HGB 15.0 01/24/2017   HCT 44.4 01/24/2017   PLT 281.0 01/24/2017   GLUCOSE 119 (H) 03/09/2018   CHOL 184 10/19/2016    TRIG 171.0 (H) 10/19/2016   HDL 49.50 10/19/2016   LDLDIRECT 131.3 09/30/2010   LDLCALC 100 (H) 10/19/2016   ALT 12 03/09/2018   AST 13 03/09/2018   NA 138 03/09/2018   K 4.2 03/09/2018   CL 104 03/09/2018   CREATININE 0.85 03/09/2018   BUN 14 03/09/2018   CO2 30 03/09/2018   TSH 1.31 01/24/2017   HGBA1C 6.7 (H) 09/14/2018  MICROALBUR 0.72 10/20/2012    Lab Results  Component Value Date   TSH 1.31 01/24/2017   Lab Results  Component Value Date   WBC 6.7 01/24/2017   HGB 15.0 01/24/2017   HCT 44.4 01/24/2017   MCV 91.7 01/24/2017   PLT 281.0 01/24/2017   Lab Results  Component Value Date   NA 138 03/09/2018   K 4.2 03/09/2018   CO2 30 03/09/2018   GLUCOSE 119 (H) 03/09/2018   BUN 14 03/09/2018   CREATININE 0.85 03/09/2018   BILITOT 0.4 03/09/2018   ALKPHOS 47 03/09/2018   AST 13 03/09/2018   ALT 12 03/09/2018   PROT 6.7 03/09/2018   ALBUMIN 4.1 03/09/2018   CALCIUM 9.1 03/09/2018   GFR 73.32 03/09/2018   Lab Results  Component Value Date   CHOL 184 10/19/2016   Lab Results  Component Value Date   HDL 49.50 10/19/2016   Lab Results  Component Value Date   LDLCALC 100 (H) 10/19/2016   Lab Results  Component Value Date   TRIG 171.0 (H) 10/19/2016   Lab Results  Component Value Date   CHOLHDL 4 10/19/2016   Lab Results  Component Value Date   HGBA1C 6.7 (H) 09/14/2018       Assessment & Plan:   Problem List Items Addressed This Visit    TOBACCO USER    Unfortunately she continues to smoke and she feels it is her best way to manage the stress of caring for her father with worsening dementia. She is encouraged to quit and warned that Covid is very tough on smokers so if there is anything we can do to help when she is ready to quit she should let us know      Allergy    Daily antihistamine can be considered      Bronchitis    She tends to get an annual bronchitis and feels this is her infection this year. Has a cough, congestion, malaise.  Started on Mucinex, Amoxicillin, Albuterol and probiotics. Should self isolate for 7 days and let us know if symptoms worsen. Refill given on cough syrup         I am having Ashley Jackson start on albuterol, amoxicillin, and guaiFENesin. I am also having her maintain her ALPRAZolam, sertraline, and guaiFENesin-codeine.  Meds ordered this encounter  Medications  . guaiFENesin-codeine (VIRTUSSIN A/C) 100-10 MG/5ML syrup    Sig: Take 5 mLs by mouth 3 (three) times daily as needed for cough.    Dispense:  150 mL    Refill:  0  . albuterol (PROVENTIL HFA;VENTOLIN HFA) 108 (90 Base) MCG/ACT inhaler    Sig: Inhale 2 puffs into the lungs every 6 (six) hours as needed for wheezing or shortness of breath.    Dispense:  1 Inhaler    Refill:  0  . amoxicillin (AMOXIL) 500 MG capsule    Sig: Take 1 capsule (500 mg total) by mouth 3 (three) times daily.    Dispense:  30 capsule    Refill:  0  . guaiFENesin (MUCINEX) 600 MG 12 hr tablet    Sig: Take 1 tablet (600 mg total) by mouth 2 (two) times daily as needed.    Dispense:  30 tablet    Refill:  1     Danise Edge, MD     I discussed the assessment and treatment plan with the patient. The patient was provided an opportunity to ask questions and all were answered. The patient agreed with  the plan and demonstrated an understanding of the instructions.   The patient was advised to call back or seek an in-person evaluation if the symptoms worsen or if the condition fails to improve as anticipated.  I provided 18 minutes of non-face-to-face time during this encounter.   Danise Edge, MD

## 2018-11-21 NOTE — Assessment & Plan Note (Addendum)
She tends to get an annual bronchitis and feels this is her infection this year. Has a cough, congestion, malaise. Started on Mucinex, Amoxicillin, Albuterol and probiotics. Should self isolate for 7 days and let us know if symptoms worsen. Refill given on cough syrup

## 2018-12-06 ENCOUNTER — Encounter: Payer: Self-pay | Admitting: Family Medicine

## 2018-12-06 ENCOUNTER — Other Ambulatory Visit: Payer: Self-pay | Admitting: Family Medicine

## 2018-12-08 ENCOUNTER — Other Ambulatory Visit: Payer: Self-pay | Admitting: Family Medicine

## 2018-12-08 MED ORDER — AMOXICILLIN 500 MG PO CAPS: 500.0000 mg | ORAL_CAPSULE | Freq: Three times a day (TID) | ORAL | 0 refills | Status: DC

## 2019-01-06 ENCOUNTER — Other Ambulatory Visit: Payer: Self-pay | Admitting: Family Medicine

## 2019-01-06 DIAGNOSIS — Z79899 Other long term (current) drug therapy: Secondary | ICD-10-CM

## 2019-01-08 NOTE — Telephone Encounter (Signed)
Requesting: Xanax Contract: 06/03/2017 UDS: 09/14/2018, low risk, next screen 03/04/2019 Last OV: 11/21/2018 Next OV: 03/15/2019 Last Refill: 09/14/2018, #120-- 3 RF Database:   Please advise

## 2019-03-06 ENCOUNTER — Other Ambulatory Visit: Payer: Self-pay | Admitting: Family Medicine

## 2019-03-06 DIAGNOSIS — Z79899 Other long term (current) drug therapy: Secondary | ICD-10-CM

## 2019-03-06 NOTE — Telephone Encounter (Signed)
Requesting:xanax Contract:yes UDS: low risk next screen 03/04/19 Last OV:11/21/18 Next OV:03/15/19 Last Refill:01/08/19  #[id number] Database:   Please advise

## 2019-03-15 ENCOUNTER — Ambulatory Visit: Payer: PRIVATE HEALTH INSURANCE | Admitting: Family Medicine

## 2019-03-27 ENCOUNTER — Ambulatory Visit: Payer: PRIVATE HEALTH INSURANCE | Admitting: Family Medicine

## 2019-03-27 ENCOUNTER — Encounter: Payer: Self-pay | Admitting: Family Medicine

## 2019-03-27 ENCOUNTER — Other Ambulatory Visit: Payer: Self-pay

## 2019-03-27 VITALS — BP 120/82 | HR 98 | Temp 98.0°F | Resp 18 | Wt 170.8 lb

## 2019-03-27 DIAGNOSIS — F17200 Nicotine dependence, unspecified, uncomplicated: Secondary | ICD-10-CM

## 2019-03-27 DIAGNOSIS — Z79899 Other long term (current) drug therapy: Secondary | ICD-10-CM

## 2019-03-27 DIAGNOSIS — E1169 Type 2 diabetes mellitus with other specified complication: Secondary | ICD-10-CM

## 2019-03-27 DIAGNOSIS — E782 Mixed hyperlipidemia: Secondary | ICD-10-CM | POA: Diagnosis not present

## 2019-03-27 DIAGNOSIS — L089 Local infection of the skin and subcutaneous tissue, unspecified: Secondary | ICD-10-CM | POA: Insufficient documentation

## 2019-03-27 DIAGNOSIS — E669 Obesity, unspecified: Secondary | ICD-10-CM

## 2019-03-27 DIAGNOSIS — R002 Palpitations: Secondary | ICD-10-CM

## 2019-03-27 DIAGNOSIS — F418 Other specified anxiety disorders: Secondary | ICD-10-CM

## 2019-03-27 LAB — COMPREHENSIVE METABOLIC PANEL
ALT: 18 U/L (ref 0–35)
AST: 17 U/L (ref 0–37)
Albumin: 4.3 g/dL (ref 3.5–5.2)
Alkaline Phosphatase: 59 U/L (ref 39–117)
BUN: 14 mg/dL (ref 6–23)
CO2: 28 mEq/L (ref 19–32)
Calcium: 9.6 mg/dL (ref 8.4–10.5)
Chloride: 104 mEq/L (ref 96–112)
Creatinine, Ser: 0.86 mg/dL (ref 0.40–1.20)
GFR: 67.8 mL/min (ref >60.00)
Glucose, Bld: 143 mg/dL — ABNORMAL HIGH (ref 70–99)
Potassium: 4.5 mEq/L (ref 3.5–5.1)
Sodium: 140 mEq/L (ref 135–145)
Total Bilirubin: 0.4 mg/dL (ref 0.2–1.2)
Total Protein: 7 g/dL (ref 6.0–8.3)

## 2019-03-27 LAB — TSH: TSH: 1.86 u[IU]/mL (ref 0.35–4.50)

## 2019-03-27 LAB — CBC
HCT: 43.8 % (ref 36.0–46.0)
Hemoglobin: 14.6 g/dL (ref 12.0–15.0)
MCHC: 33.3 g/dL (ref 30.0–36.0)
MCV: 92.6 fl (ref 78.0–100.0)
Platelets: 248 10*3/uL (ref 150.0–400.0)
RBC: 4.73 Mil/uL (ref 3.87–5.11)
RDW: 13.4 % (ref 11.5–15.5)
WBC: 6.4 10*3/uL (ref 4.0–10.5)

## 2019-03-27 LAB — LIPID PANEL
Cholesterol: 264 mg/dL — ABNORMAL HIGH (ref 0–200)
HDL: 74.1 mg/dL (ref >39.00)
NonHDL: 189.46
Total CHOL/HDL Ratio: 4
Triglycerides: 234 mg/dL — ABNORMAL HIGH (ref 0.0–149.0)
VLDL: 46.8 mg/dL — ABNORMAL HIGH (ref 0.0–40.0)

## 2019-03-27 LAB — HEMOGLOBIN A1C: Hgb A1c MFr Bld: 7 % — ABNORMAL HIGH (ref 4.6–6.5)

## 2019-03-27 LAB — LDL CHOLESTEROL, DIRECT: Direct LDL: 162 mg/dL

## 2019-03-27 MED ORDER — AMOXICILLIN 500 MG PO CAPS: 500.0000 mg | ORAL_CAPSULE | Freq: Three times a day (TID) | ORAL | 0 refills | Status: DC

## 2019-03-27 MED ORDER — ALPRAZOLAM 0.5 MG PO TABS: ORAL_TABLET | ORAL | 0 refills | Status: DC

## 2019-03-27 MED ORDER — SERTRALINE HCL 100 MG PO TABS: 100.0000 mg | ORAL_TABLET | Freq: Every day | ORAL | 5 refills | Status: DC
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
## 2019-03-27 NOTE — Assessment & Plan Note (Signed)
Lateral left hip. Small abscess started on Amoxicillin and clean daily with peroxide cover with antibiotic oitnement and bandaid

## 2019-03-27 NOTE — Assessment & Plan Note (Signed)
Encouraged cessation once again, she is not ready
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
## 2019-03-27 NOTE — Patient Instructions (Signed)
Carbohydrate Counting for Diabetes Mellitus, Adult  Carbohydrate counting is a method of keeping track of how many carbohydrates you eat. Eating carbohydrates naturally increases the amount of sugar (glucose) in the blood. Counting how many carbohydrates you eat helps keep your blood glucose within normal limits, which helps you manage your diabetes (diabetes mellitus). It is important to know how many carbohydrates you can safely have in each meal. This is different for every person. A diet and nutrition specialist (registered dietitian) can help you make a meal plan and calculate how many carbohydrates you should have at each meal and snack. Carbohydrates are found in the following foods:  Grains, such as breads and cereals.  Dried beans and soy products.  Starchy vegetables, such as potatoes, peas, and corn.  Fruit and fruit juices.  Milk and yogurt.  Sweets and snack foods, such as cake, cookies, candy, chips, and soft drinks. How do I count carbohydrates? There are two ways to count carbohydrates in food. You can use either of the methods or a combination of both. Reading "Nutrition Facts" on packaged food The "Nutrition Facts" list is included on the labels of almost all packaged foods and beverages in the [place] It includes:  The serving size.  Information about nutrients in each serving, including the grams (g) of carbohydrate per serving. To use the "Nutrition Facts":  Decide how many servings you will have.  Multiply the number of servings by the number of carbohydrates per serving.  The resulting number is the total amount of carbohydrates that you will be having. Learning standard serving sizes of other foods When you eat carbohydrate foods that are not packaged or do not include "Nutrition Facts" on the label, you need to measure the servings in order to count the amount of carbohydrates:  Measure the foods that you will eat with a food scale or measuring cup, if needed.   Decide how many standard-size servings you will eat.  Multiply the number of servings by 15. Most carbohydrate-rich foods have about 15 g of carbohydrates per serving. ? For example, if you eat 8 oz (170 g) of strawberries, you will have eaten 2 servings and 30 g of carbohydrates (2 servings x 15 g = 30 g).  For foods that have more than one food mixed, such as soups and casseroles, you must count the carbohydrates in each food that is included. The following list contains standard serving sizes of common carbohydrate-rich foods. Each of these servings has about 15 g of carbohydrates:   hamburger bun or  English muffin.   oz (15 mL) syrup.   oz (14 g) jelly.  1 slice of bread.  1 six-inch tortilla.  3 oz (85 g) cooked rice or pasta.  4 oz (113 g) cooked dried beans.  4 oz (113 g) starchy vegetable, such as peas, corn, or potatoes.  4 oz (113 g) hot cereal.  4 oz (113 g) mashed potatoes or  of a large baked potato.  4 oz (113 g) canned or frozen fruit.  4 oz (120 mL) fruit juice.  4-6 crackers.  6 chicken nuggets.  6 oz (170 g) unsweetened dry cereal.  6 oz (170 g) plain fat-free yogurt or yogurt sweetened with artificial sweeteners.  8 oz (240 mL) milk.  8 oz (170 g) fresh fruit or one small piece of fruit.  24 oz (680 g) popped popcorn. Example of carbohydrate counting Sample meal  3 oz (85 g) chicken breast.  6 oz (170 g)   brown rice.  4 oz (113 g) corn.  8 oz (240 mL) milk.  8 oz (170 g) strawberries with sugar-free whipped topping. Carbohydrate calculation 1. Identify the foods that contain carbohydrates: ? Rice. ? Corn. ? Milk. ? Strawberries. 2. Calculate how many servings you have of each food: ? 2 servings rice. ? 1 serving corn. ? 1 serving milk. ? 1 serving strawberries. 3. Multiply each number of servings by 15 g: ? 2 servings rice x 15 g = 30 g. ? 1 serving corn x 15 g = 15 g. ? 1 serving milk x 15 g = 15 g. ? 1 serving  strawberries x 15 g = 15 g. 4. Add together all of the amounts to find the total grams of carbohydrates eaten: ? 30 g + 15 g + 15 g + 15 g = 75 g of carbohydrates total. Summary  Carbohydrate counting is a method of keeping track of how many carbohydrates you eat.  Eating carbohydrates naturally increases the amount of sugar (glucose) in the blood.  Counting how many carbohydrates you eat helps keep your blood glucose within normal limits, which helps you manage your diabetes.  A diet and nutrition specialist (registered dietitian) can help you make a meal plan and calculate how many carbohydrates you should have at each meal and snack. This information is not intended to replace advice given to you by your health care provider. Make sure you discuss any questions you have with your health care provider. Document Released: 08/09/2005 Document Revised: 03/03/2017 Document Reviewed: 01/21/2016 Elsevier Patient Education  2020 Elsevier Inc.  

## 2019-03-27 NOTE — Assessment & Plan Note (Signed)
hgba1c acceptable, minimize simple carbs. Increase exercise as tolerated. Continue current meds 

## 2019-03-27 NOTE — Assessment & Plan Note (Signed)
Encouraged heart healthy diet, increase exercise, avoid trans fats, consider a krill oil cap daily 

## 2019-04-03 MED ORDER — ATORVASTATIN CALCIUM 10 MG PO TABS: ORAL_TABLET | ORAL | 3 refills | Status: DC

## 2019-04-03 NOTE — Addendum Note (Signed)
Addended by: Magdalene Molly A on: 04/03/2019 09:34 AM   Modules accepted: Orders

## 2019-04-11 ENCOUNTER — Encounter: Payer: Self-pay | Admitting: Family Medicine

## 2019-04-12 ENCOUNTER — Telehealth: Payer: Self-pay | Admitting: *Deleted

## 2019-04-12 NOTE — Telephone Encounter (Signed)
Patient had labs done on 03/27/19 and was not fasting and wants to know if she can come back in for lab only for lipid test.

## 2019-04-12 NOTE — Telephone Encounter (Signed)
See mychart message   Copied from [place] [phone]. Topic: General - Other >> Apr 03, 2019  2:21 PM Leward Quan A wrote: Reason for CRM: Patient called to ask Dr Charlett Blake can she come back and have her Cholesterol rechecked because when she came in and had labs done she was not fasting on that morning. Patient can be reached at Ph#  [phone]

## 2019-04-13 ENCOUNTER — Other Ambulatory Visit: Payer: Self-pay | Admitting: *Deleted

## 2019-04-13 DIAGNOSIS — E782 Mixed hyperlipidemia: Secondary | ICD-10-CM

## 2019-04-16 ENCOUNTER — Other Ambulatory Visit: Payer: Self-pay

## 2019-04-19 ENCOUNTER — Other Ambulatory Visit (INDEPENDENT_AMBULATORY_CARE_PROVIDER_SITE_OTHER): Payer: PRIVATE HEALTH INSURANCE

## 2019-04-19 ENCOUNTER — Other Ambulatory Visit: Payer: Self-pay

## 2019-04-19 DIAGNOSIS — E782 Mixed hyperlipidemia: Secondary | ICD-10-CM

## 2019-04-19 LAB — LIPID PANEL
Cholesterol: 258 mg/dL — ABNORMAL HIGH (ref 0–200)
HDL: 76.5 mg/dL (ref >39.00)
LDL Cholesterol: 149 mg/dL — ABNORMAL HIGH (ref 0–99)
NonHDL: 181.29
Total CHOL/HDL Ratio: 3
Triglycerides: 159 mg/dL — ABNORMAL HIGH (ref 0.0–149.0)
VLDL: 31.8 mg/dL (ref 0.0–40.0)

## 2019-05-07 ENCOUNTER — Other Ambulatory Visit: Payer: Self-pay | Admitting: Family Medicine

## 2019-05-07 DIAGNOSIS — Z79899 Other long term (current) drug therapy: Secondary | ICD-10-CM

## 2019-05-07 NOTE — Telephone Encounter (Signed)
Last written:  03/27/19 Last ov: 03/27/19 Next ov: 09/27/19 Contract: DUE UDS: 09/15/19

## 2019-06-04 ENCOUNTER — Other Ambulatory Visit: Payer: Self-pay | Admitting: Family Medicine

## 2019-06-04 DIAGNOSIS — Z79899 Other long term (current) drug therapy: Secondary | ICD-10-CM

## 2019-06-04 NOTE — Telephone Encounter (Signed)
Refill request for Xanax  Last written:  05/07/2019 Last ov: 03/27/19 Next ov: 09/27/19  Contract: DUE UDS: 09/15/19   Please advise

## 2019-06-30 ENCOUNTER — Other Ambulatory Visit: Payer: Self-pay | Admitting: Family Medicine

## 2019-08-08 ENCOUNTER — Other Ambulatory Visit: Payer: Self-pay | Admitting: Family Medicine

## 2019-08-08 DIAGNOSIS — Z79899 Other long term (current) drug therapy: Secondary | ICD-10-CM

## 2019-08-09 NOTE — Telephone Encounter (Signed)
Last OV 03/27/19 Last refill 06/04/19 # 120/1 Next OV 09/27/19

## 2019-09-04 ENCOUNTER — Encounter: Payer: Self-pay | Admitting: Family Medicine

## 2019-09-26 ENCOUNTER — Encounter: Payer: Self-pay | Admitting: Family Medicine

## 2019-09-26 ENCOUNTER — Ambulatory Visit (INDEPENDENT_AMBULATORY_CARE_PROVIDER_SITE_OTHER): Payer: 59 | Admitting: Family Medicine

## 2019-09-26 ENCOUNTER — Other Ambulatory Visit: Payer: Self-pay

## 2019-09-26 VITALS — Temp 97.3°F | Ht 63.0 in | Wt 175.0 lb

## 2019-09-26 DIAGNOSIS — J40 Bronchitis, not specified as acute or chronic: Secondary | ICD-10-CM

## 2019-09-26 MED ORDER — ALBUTEROL SULFATE HFA 108 (90 BASE) MCG/ACT IN AERS: 1.0000 | INHALATION_SPRAY | RESPIRATORY_TRACT | 0 refills | Status: DC | PRN

## 2019-09-26 MED ORDER — METHYLPREDNISOLONE 4 MG PO TBPK: ORAL_TABLET | ORAL | 0 refills | Status: DC

## 2019-09-26 NOTE — Progress Notes (Addendum)
Chief Complaint  Patient presents with  . Cough    since January. using essential oil defusers  . Medication Problem    has been on an antibiotic since Jan 30 per by dentist    Etheleen Sia here for URI complaints. Due to COVID-19 pandemic, we are interacting via telephone. I verified patient's ID using 2 identifiers. Patient agreed to proceed with visit via this method. Patient is at home, I am at office. Patient and I are present for visit.   Duration: 3 weeks; had been running essential oil diffusers a week prior to onset up symptoms Associated symptoms: wheezing and cough Denies: sinus congestion, sinus pain, rhinorrhea, itchy watery eyes, ear pain, ear drainage, sore throat, shortness of breath, myalgia and fevers Treatment to date: robitussin-codeine, amoxicillin Sick contacts: No  ROS:  Const: Denies fevers HEENT: As noted in HPI Lungs: No SOB  Past Medical History:  Diagnosis Date  . Allergic state 01/24/2017  . Anxiety state, unspecified 07/12/2007  . COLONIC POLYPS, HX OF 05/02/2007  . DEPRESSION 07/12/2007  . DIABETES MELLITUS, TYPE II, UNCONTROLLED 02/25/2010  . EXOGENOUS OBESITY 11/07/2007  . Hematuria 06/03/2017  . HYPERGLYCEMIA 07/12/2007  . HYPERLIPIDEMIA 02/07/2008  . Low back pain 04/29/2013  . Overweight(278.02) 06/24/2010  . Palpitations 01/20/2016  . Preventative health care 04/29/2013  . Tobacco abuse 10/24/2012  . TOBACCO USER 06/24/2010    Temp (!) 97.3 F (36.3 C) (Oral)   Ht 5\' 3"  (1.6 m)   Wt 175 lb (79.4 kg)   LMP 12/09/2015   BMI 31.00 kg/m  No conversational dyspnea Age appropriate judgment and insight Nml affect and mood  Wheezy bronchitis - Plan: albuterol (VENTOLIN HFA) 108 (90 Base) MCG/ACT inhaler, methylPREDNISolone (MEDROL DOSEPAK) 4 MG TBPK tablet  Try SABA first. If no improvement, medrol dosepak. In person visit if no improvement by next week. Suspect irritation from ess oils to lungs.  F/u prn. If starting to experience fevers, shaking, or  shortness of breath, seek immediate care. Total time: 14 min Pt voiced understanding and agreement to the plan.  12/11/2015 Ebony, DO 09/26/19 9:47 AM

## 2019-09-27 ENCOUNTER — Ambulatory Visit: Payer: PRIVATE HEALTH INSURANCE | Admitting: Family Medicine

## 2019-10-12 ENCOUNTER — Other Ambulatory Visit: Payer: Self-pay | Admitting: Family Medicine

## 2019-10-12 ENCOUNTER — Telehealth: Payer: Self-pay

## 2019-10-12 ENCOUNTER — Ambulatory Visit: Payer: 59 | Admitting: Family Medicine

## 2019-10-12 ENCOUNTER — Other Ambulatory Visit: Payer: Self-pay

## 2019-10-12 DIAGNOSIS — Z79899 Other long term (current) drug therapy: Secondary | ICD-10-CM

## 2019-10-12 NOTE — Telephone Encounter (Signed)
Requesting:xanax Contract:yes ANV:BTYOMAYO risk  last OV:03/27/19 with pcp 09/26/19 w/ [place]  Next OV:has appt today  Last Refill:08/09/19  #120-1rf Database:   Please advise

## 2019-10-12 NOTE — Telephone Encounter (Signed)
Should be all set

## 2019-10-12 NOTE — Telephone Encounter (Signed)
Patient was scheduled for today and did not answer, she called the office I spoke with her and RS her to 11/01/19,  She wants to do blood work before her appt and also needs her Xanax refilled  Please advise

## 2019-10-13 ENCOUNTER — Encounter: Payer: Self-pay | Admitting: Family Medicine

## 2019-10-14 NOTE — Progress Notes (Signed)
Left numerous messages for patient and was unable to connect with her

## 2019-10-31 ENCOUNTER — Other Ambulatory Visit: Payer: Self-pay

## 2019-11-01 ENCOUNTER — Other Ambulatory Visit: Payer: Self-pay

## 2019-11-01 ENCOUNTER — Ambulatory Visit (INDEPENDENT_AMBULATORY_CARE_PROVIDER_SITE_OTHER): Payer: 59 | Admitting: Family Medicine

## 2019-11-01 ENCOUNTER — Encounter: Payer: Self-pay | Admitting: Family Medicine

## 2019-11-01 VITALS — BP 150/74 | HR 95 | Temp 97.7°F | Resp 16 | Ht 63.0 in | Wt 182.0 lb

## 2019-11-01 DIAGNOSIS — E782 Mixed hyperlipidemia: Secondary | ICD-10-CM | POA: Diagnosis not present

## 2019-11-01 DIAGNOSIS — M545 Low back pain, unspecified: Secondary | ICD-10-CM

## 2019-11-01 DIAGNOSIS — R002 Palpitations: Secondary | ICD-10-CM

## 2019-11-01 DIAGNOSIS — F418 Other specified anxiety disorders: Secondary | ICD-10-CM | POA: Diagnosis not present

## 2019-11-01 DIAGNOSIS — F17200 Nicotine dependence, unspecified, uncomplicated: Secondary | ICD-10-CM

## 2019-11-01 DIAGNOSIS — E1169 Type 2 diabetes mellitus with other specified complication: Secondary | ICD-10-CM

## 2019-11-01 DIAGNOSIS — E669 Obesity, unspecified: Secondary | ICD-10-CM

## 2019-11-01 DIAGNOSIS — Z79899 Other long term (current) drug therapy: Secondary | ICD-10-CM

## 2019-11-01 LAB — TSH: TSH: 1.33 u[IU]/mL (ref 0.35–4.50)

## 2019-11-01 LAB — CBC
HCT: 43.8 % (ref 36.0–46.0)
Hemoglobin: 14.7 g/dL (ref 12.0–15.0)
MCHC: 33.5 g/dL (ref 30.0–36.0)
MCV: 91 fl (ref 78.0–100.0)
Platelets: 255 10*3/uL (ref 150.0–400.0)
RBC: 4.81 Mil/uL (ref 3.87–5.11)
RDW: 12.7 % (ref 11.5–15.5)
WBC: 8 10*3/uL (ref 4.0–10.5)

## 2019-11-01 LAB — LIPID PANEL
Cholesterol: 228 mg/dL — ABNORMAL HIGH (ref 0–200)
HDL: 67.6 mg/dL (ref >39.00)
NonHDL: 160.86
Total CHOL/HDL Ratio: 3
Triglycerides: 229 mg/dL — ABNORMAL HIGH (ref 0.0–149.0)
VLDL: 45.8 mg/dL — ABNORMAL HIGH (ref 0.0–40.0)

## 2019-11-01 LAB — COMPREHENSIVE METABOLIC PANEL
ALT: 32 U/L (ref 0–35)
AST: 28 U/L (ref 0–37)
Albumin: 4.1 g/dL (ref 3.5–5.2)
Alkaline Phosphatase: 67 U/L (ref 39–117)
BUN: 12 mg/dL (ref 6–23)
CO2: 27 mEq/L (ref 19–32)
Calcium: 9.3 mg/dL (ref 8.4–10.5)
Chloride: 102 mEq/L (ref 96–112)
Creatinine, Ser: 0.84 mg/dL (ref 0.40–1.20)
GFR: 69.52 mL/min (ref >60.00)
Glucose, Bld: 256 mg/dL — ABNORMAL HIGH (ref 70–99)
Potassium: 3.9 mEq/L (ref 3.5–5.1)
Sodium: 137 mEq/L (ref 135–145)
Total Bilirubin: 0.3 mg/dL (ref 0.2–1.2)
Total Protein: 7.4 g/dL (ref 6.0–8.3)

## 2019-11-01 LAB — HEMOGLOBIN A1C: Hgb A1c MFr Bld: 9.3 % — ABNORMAL HIGH (ref 4.6–6.5)

## 2019-11-01 LAB — LDL CHOLESTEROL, DIRECT: Direct LDL: 134 mg/dL

## 2019-11-01 MED ORDER — SERTRALINE HCL 100 MG PO TABS: 100.0000 mg | ORAL_TABLET | Freq: Every day | ORAL | 1 refills | Status: DC

## 2019-11-01 MED ORDER — ALPRAZOLAM 0.5 MG PO TABS: 0.5000 mg | ORAL_TABLET | Freq: Four times a day (QID) | ORAL | 5 refills | Status: DC | PRN

## 2019-11-01 NOTE — Patient Instructions (Signed)
[employer] Blood Pressure cuff, upper arm, want BP 100-140/60-90 Pulse oximeter, want oxygen in 90s  Weekly vitals  Take Multivitamin with minerals, selenium Vitamin D 1000-2000 IU daily Probiotic with lactobacillus and bifidophilus Asprin EC 81 mg daily  Melatonin 2-5 mg at bedtime  [email or web address]  The mRNA technology has been in development for 20 years and we already had the Coronavirus family of viruses (which usually just cause the common cold) genetically mapped already which is why we were able to come up with viable vaccine candidates so quickly in stage 1, then stage 2 scientifically took the correct amount of time what we did to speed it up was just build the manufacturing platform at the same time we were running the experiments so if it worked we could produce faster. And stage 3 has now had many months and millions of people immunized and we are seeing the immunity hold for over 9 months now with sign of it dissipating and no significant numbers of adverse reactions.  During every flu season we see 2 anaphylactic reactions for every million shots given and we initially thought we would see 11 per million with the COVID vaccine but now we see only 2-3 with Moderna and 5 or so with Pfizer so compared to someone is dying every 20 minutes from COVID and more deadly and infectious strains are coming it is definitely best when weighing the risks and benefits to take the shots.  Another pooled analysis of the 5 most utilized vaccines in the world shows that after full immunization so far no one has died from COVID.  

## 2019-11-03 LAB — PAIN MGMT, PROFILE 8 W/CONF, U
6 Acetylmorphine: NEGATIVE ng/mL
Alcohol Metabolites: NEGATIVE ng/mL (ref <500)
Alphahydroxyalprazolam: 97 ng/mL
Alphahydroxymidazolam: NEGATIVE ng/mL
Alphahydroxytriazolam: NEGATIVE ng/mL
Aminoclonazepam: NEGATIVE ng/mL
Amphetamines: NEGATIVE ng/mL
Benzodiazepines: POSITIVE ng/mL
Buprenorphine, Urine: NEGATIVE ng/mL
Cocaine Metabolite: NEGATIVE ng/mL
Creatinine: 96.9 mg/dL
Hydroxyethylflurazepam: NEGATIVE ng/mL
Lorazepam: NEGATIVE ng/mL
MDMA: NEGATIVE ng/mL
Marijuana Metabolite: NEGATIVE ng/mL
Nordiazepam: NEGATIVE ng/mL
Opiates: NEGATIVE ng/mL
Oxazepam: NEGATIVE ng/mL
Oxidant: NEGATIVE ug/mL
Oxycodone: NEGATIVE ng/mL
Temazepam: NEGATIVE ng/mL
pH: 5.3 (ref 4.5–9.0)

## 2019-11-04 NOTE — Assessment & Plan Note (Signed)
Encouraged moist heat and gentle stretching as tolerated. May try NSAIDs and prescription meds as directed and report if symptoms worsen or seek immediate care 

## 2019-11-04 NOTE — Assessment & Plan Note (Signed)
Encouraged complete cessation. Discussed need to quit as relates to risk of numerous cancers, cardiac and pulmonary disease as well as neurologic complications. Counseled for greater than 3 minutes. She continues to decline attempts at quitting

## 2019-11-04 NOTE — Assessment & Plan Note (Signed)
hgba1c acceptable, minimize simple carbs. Increase exercise as tolerated. Continue current meds 

## 2019-11-04 NOTE — Assessment & Plan Note (Signed)
No recent exacerbation 

## 2019-11-04 NOTE — Progress Notes (Signed)
Subjective:    Patient ID: Ashley Jackson, female    DOB: [birth year], 59 y.o.   MRN: [id number]  Chief Complaint  Patient presents with  . Diabetes    Here for follow up     HPI Patient is in today for follow up on chronic medical concerns. No recent febrile illness or hospitalizations. She continues to struggle with stress but feels her meds are helping her enough. She had moved into her boyfriend's home after her father died but she had to move out because she could not handle the stress of having his autistic granddaughter there. Denies CP/palp/SOB/HA/congestion/fevers/GI or GU c/o. Taking meds as prescribed  Past Medical History:  Diagnosis Date  . Allergic state 01/24/2017  . Anxiety state, unspecified 07/12/2007  . COLONIC POLYPS, HX OF 05/02/2007  . DEPRESSION 07/12/2007  . DIABETES MELLITUS, TYPE II, UNCONTROLLED 02/25/2010  . EXOGENOUS OBESITY 11/07/2007  . Hematuria 06/03/2017  . HYPERGLYCEMIA 07/12/2007  . HYPERLIPIDEMIA 02/07/2008  . Low back pain 04/29/2013  . Overweight(278.02) 06/24/2010  . Palpitations 01/20/2016  . Preventative health care 04/29/2013  . Tobacco abuse 10/24/2012  . TOBACCO USER 06/24/2010    Past Surgical History:  Procedure Laterality Date  . COLONOSCOPY  2006    Family History  Problem Relation Age of Onset  . Liver cancer Mother   . Hypertension Other   . Hypertension Father   . Stroke Brother   . Seizures Son   . Diabetes Maternal Uncle   . Breast cancer Neg Hx   . Prostate cancer Neg Hx   . Heart disease Neg Hx     Social History   Socioeconomic History  . Marital status: Divorced    Spouse name: Not on file  . Number of children: Not on file  . Years of education: Not on file  . Highest education level: Not on file  Occupational History  . Not on file  Tobacco Use  . Smoking status: Current Every Day Smoker    Packs/day: 1.00  . Smokeless tobacco: Never Used  Substance and Sexual Activity  . Alcohol use: Not Currently  . Drug  use: No  . Sexual activity: Not Currently  Other Topics Concern  . Not on file  Social History Narrative  . Not on file   Social Determinants of Health   Financial Resource Strain:   . Difficulty of Paying Living Expenses:   Food Insecurity:   . Worried About Programme researcher, broadcasting/film/video in the Last Year:   . Barista in the Last Year:   Transportation Needs:   . Freight forwarder (Medical):   Marland Kitchen Lack of Transportation (Non-Medical):   Physical Activity:   . Days of Exercise per Week:   . Minutes of Exercise per Session:   Stress:   . Feeling of Stress :   Social Connections:   . Frequency of Communication with Friends and Family:   . Frequency of Social Gatherings with Friends and Family:   . Attends Religious Services:   . Active Member of Clubs or Organizations:   . Attends Banker Meetings:   Marland Kitchen Marital Status:   Intimate Partner Violence:   . Fear of Current or Ex-Partner:   . Emotionally Abused:   Marland Kitchen Physically Abused:   . Sexually Abused:     Outpatient Medications Prior to Visit  Medication Sig Dispense Refill  . ALPRAZolam (XANAX) 0.5 MG tablet TAKE 1 TABLET 4 TIMES A DAY AS NEEDED  ANXIETY 120 tablet 0  . sertraline (ZOLOFT) 100 MG tablet TAKE 1 TABLET BY MOUTH EVERY DAY 90 tablet 1  . albuterol (VENTOLIN HFA) 108 (90 Base) MCG/ACT inhaler Inhale 1-2 puffs into the lungs every 4 (four) hours as needed for wheezing or shortness of breath (Cough). 18 g 0  . atorvastatin (LIPITOR) 10 MG tablet Take 1/2 tablet by mouth daily 90 tablet 3  . guaiFENesin (MUCINEX) 600 MG 12 hr tablet Take 1 tablet (600 mg total) by mouth 2 (two) times daily as needed. (Patient not taking: Reported on 09/26/2019) 30 tablet 1  . guaiFENesin-codeine (VIRTUSSIN A/C) 100-10 MG/5ML syrup Take 5 mLs by mouth 3 (three) times daily as needed for cough. 150 mL 0  . methylPREDNISolone (MEDROL DOSEPAK) 4 MG TBPK tablet Follow instructions on package. 21 tablet 0   No  facility-administered medications prior to visit.    Allergies  Allergen Reactions  . Atarax [Hydroxyzine] Shortness Of Breath    Anxiety, shortness of breath, tachycardia   . Cortisone     HEART RACING  . Levofloxacin     anxiety  . Sulfa Antibiotics     Panic attacks  . Sulfonamide Derivatives     REACTION: Panic attacks  . Levofloxacin Anxiety    Review of Systems  Constitutional: Positive for malaise/fatigue. Negative for fever.  HENT: Negative for congestion.   Eyes: Negative for blurred vision.  Respiratory: Negative for shortness of breath.   Cardiovascular: Negative for chest pain, palpitations and leg swelling.  Gastrointestinal: Negative for abdominal pain, blood in stool and nausea.  Genitourinary: Negative for dysuria and frequency.  Musculoskeletal: Positive for back pain. Negative for falls.  Skin: Negative for rash.  Neurological: Negative for dizziness, loss of consciousness and headaches.  Endo/Heme/Allergies: Negative for environmental allergies.  Psychiatric/Behavioral: Positive for depression. Negative for suicidal ideas. The patient is nervous/anxious.        Objective:    Physical Exam Vitals and nursing note reviewed.  Constitutional:      General: She is not in acute distress.    Appearance: She is well-developed.  HENT:     Head: Normocephalic and atraumatic.     Nose: Nose normal.  Eyes:     General:        Right eye: No discharge.        Left eye: No discharge.  Cardiovascular:     Rate and Rhythm: Normal rate and regular rhythm.     Heart sounds: No murmur.  Pulmonary:     Effort: Pulmonary effort is normal.     Breath sounds: Normal breath sounds.  Abdominal:     General: Bowel sounds are normal.     Palpations: Abdomen is soft.     Tenderness: There is no abdominal tenderness.  Musculoskeletal:     Cervical back: Normal range of motion and neck supple.  Skin:    General: Skin is warm and dry.  Neurological:     Mental  Status: She is alert and oriented to person, place, and time.     BP (!) 150/74 (BP Location: Right Arm, Patient Position: Sitting, Cuff Size: Small)   Pulse 95   Temp 97.7 F (36.5 C) (Temporal)   Resp 16   Ht 5\' 3"  (1.6 m)   Wt 182 lb (82.6 kg)   LMP 12/09/2015   SpO2 98%   BMI 32.24 kg/m  Wt Readings from Last 3 Encounters:  11/01/19 182 lb (82.6 kg)  09/26/19 175 lb (79.4 kg)  03/27/19 170 lb 12.8 oz (77.5 kg)    Diabetic Foot Exam - Simple   No data filed     Lab Results  Component Value Date   WBC 8.0 11/01/2019   HGB 14.7 11/01/2019   HCT 43.8 11/01/2019   PLT 255.0 11/01/2019   GLUCOSE 256 (H) 11/01/2019   CHOL 228 (H) 11/01/2019   TRIG 229.0 (H) 11/01/2019   HDL 67.60 11/01/2019   LDLDIRECT 134.0 11/01/2019   LDLCALC 149 (H) 04/19/2019   ALT 32 11/01/2019   AST 28 11/01/2019   NA 137 11/01/2019   K 3.9 11/01/2019   CL 102 11/01/2019   CREATININE 0.84 11/01/2019   BUN 12 11/01/2019   CO2 27 11/01/2019   TSH 1.33 11/01/2019   HGBA1C 9.3 (H) 11/01/2019   MICROALBUR 0.72 10/20/2012    Lab Results  Component Value Date   TSH 1.33 11/01/2019   Lab Results  Component Value Date   WBC 8.0 11/01/2019   HGB 14.7 11/01/2019   HCT 43.8 11/01/2019   MCV 91.0 11/01/2019   PLT 255.0 11/01/2019   Lab Results  Component Value Date   NA 137 11/01/2019   K 3.9 11/01/2019   CO2 27 11/01/2019   GLUCOSE 256 (H) 11/01/2019   BUN 12 11/01/2019   CREATININE 0.84 11/01/2019   BILITOT 0.3 11/01/2019   ALKPHOS 67 11/01/2019   AST 28 11/01/2019   ALT 32 11/01/2019   PROT 7.4 11/01/2019   ALBUMIN 4.1 11/01/2019   CALCIUM 9.3 11/01/2019   GFR 69.52 11/01/2019   Lab Results  Component Value Date   CHOL 228 (H) 11/01/2019   Lab Results  Component Value Date   HDL 67.60 11/01/2019   Lab Results  Component Value Date   LDLCALC 149 (H) 04/19/2019   Lab Results  Component Value Date   TRIG 229.0 (H) 11/01/2019   Lab Results  Component Value Date    CHOLHDL 3 11/01/2019   Lab Results  Component Value Date   HGBA1C 9.3 (H) 11/01/2019       Assessment & Plan:   Problem List Items Addressed This Visit    Diabetes mellitus type 2 in obese (HCC)    hgba1c acceptable, minimize simple carbs. Increase exercise as tolerated. Continue current meds      Relevant Orders   Hemoglobin A1c (Completed)   Comprehensive metabolic panel (Completed)   Hyperlipidemia, mixed    Encouraged heart healthy diet, increase exercise, avoid trans fats, consider a krill oil cap daily      Relevant Orders   Lipid panel (Completed)   TOBACCO USER    Encouraged complete cessation. Discussed need to quit as relates to risk of numerous cancers, cardiac and pulmonary disease as well as neurologic complications. Counseled for greater than 3 minutes. She continues to decline attempts at quitting      Depression with anxiety - Primary    Continues to be very stressed even after the loss of her father but feels her meds are helping her manage. Check UDS, refills given for medications.       Relevant Medications   ALPRAZolam (XANAX) 0.5 MG tablet   sertraline (ZOLOFT) 100 MG tablet   Other Relevant Orders   Pain Mgmt, Profile 8 w/Conf, U (Completed)   Low back pain    Encouraged moist heat and gentle stretching as tolerated. May try NSAIDs and prescription meds as directed and report if symptoms worsen or seek immediate care      Relevant  Orders   CBC (Completed)   Palpitations    No recent exacerbation.      Relevant Orders   CBC (Completed)   TSH (Completed)    Other Visit Diagnoses    High risk medication use       Relevant Medications   ALPRAZolam (XANAX) 0.5 MG tablet      I have discontinued Garvin Fila Rundle's guaiFENesin-codeine, guaiFENesin, atorvastatin, albuterol, and methylPREDNISolone. I have also changed her ALPRAZolam and sertraline.  Meds ordered this encounter  Medications  . ALPRAZolam (XANAX) 0.5 MG tablet    Sig: Take 1  tablet (0.5 mg total) by mouth 4 (four) times daily as needed for anxiety.    Dispense:  120 tablet    Refill:  5    Not to exceed 5 additional fills before 02/05/2020  . sertraline (ZOLOFT) 100 MG tablet    Sig: Take 1 tablet (100 mg total) by mouth daily.    Dispense:  90 tablet    Refill:  1     Penni Homans, MD

## 2019-11-04 NOTE — Assessment & Plan Note (Signed)
Continues to be very stressed even after the loss of her father but feels her meds are helping her manage. Check UDS, refills given for medications.

## 2019-11-04 NOTE — Assessment & Plan Note (Signed)
Encouraged heart healthy diet, increase exercise, avoid trans fats, consider a krill oil cap daily 

## 2019-11-05 ENCOUNTER — Encounter: Payer: Self-pay | Admitting: Family Medicine

## 2019-11-06 NOTE — Telephone Encounter (Signed)
Can you send glucometer for me please Please advise

## 2019-11-08 ENCOUNTER — Telehealth: Payer: Self-pay | Admitting: Family Medicine

## 2019-11-08 NOTE — Telephone Encounter (Signed)
Patient is requesting to order lab work for A1C, Patient also states that she talked to Dr Abner Greenspan about Gluometer, Dr Abner Greenspan stated that she has one for her.   Please advise

## 2019-11-08 NOTE — Telephone Encounter (Signed)
Patient is requesting to pick Glucometer tomorrow. March 19,2021

## 2019-11-09 ENCOUNTER — Other Ambulatory Visit: Payer: Self-pay

## 2019-11-09 MED ORDER — FREESTYLE SYSTEM KIT: 1.0000 | PACK | 0 refills | Status: DC | PRN

## 2019-11-09 MED ORDER — FREESTYLE LANCETS MISC: 12 refills | Status: DC

## 2019-11-09 MED ORDER — FREESTYLE TEST VI STRP: ORAL_STRIP | 12 refills | Status: DC

## 2019-11-12 ENCOUNTER — Other Ambulatory Visit: Payer: Self-pay | Admitting: *Deleted

## 2019-11-12 DIAGNOSIS — E1169 Type 2 diabetes mellitus with other specified complication: Secondary | ICD-10-CM

## 2019-11-12 NOTE — Telephone Encounter (Signed)
Fredric Mare sent on 11/09/19

## 2019-11-13 ENCOUNTER — Other Ambulatory Visit (INDEPENDENT_AMBULATORY_CARE_PROVIDER_SITE_OTHER): Payer: 59

## 2019-11-13 ENCOUNTER — Other Ambulatory Visit: Payer: Self-pay

## 2019-11-13 DIAGNOSIS — E1169 Type 2 diabetes mellitus with other specified complication: Secondary | ICD-10-CM

## 2019-11-13 DIAGNOSIS — E669 Obesity, unspecified: Secondary | ICD-10-CM

## 2019-11-13 DIAGNOSIS — E119 Type 2 diabetes mellitus without complications: Secondary | ICD-10-CM

## 2019-11-13 LAB — HEMOGLOBIN A1C: Hgb A1c MFr Bld: 9 % — ABNORMAL HIGH (ref 4.6–6.5)

## 2019-11-30 ENCOUNTER — Encounter: Payer: Self-pay | Admitting: Family Medicine

## 2019-11-30 MED ORDER — PRECISION GLUCOSE CONTROL SOLN VI SOLN: 0 refills | Status: DC

## 2019-11-30 MED ORDER — PRECISION XTRA BLOOD GLUCOSE VI STRP: ORAL_STRIP | 3 refills | Status: DC

## 2019-11-30 NOTE — Telephone Encounter (Signed)
I have sent in test strips.  Can you comment on the vitamin d question.

## 2019-11-30 NOTE — Telephone Encounter (Signed)
Rx sent in.  See other mychart message.

## 2020-02-05 ENCOUNTER — Encounter: Payer: Self-pay | Admitting: *Deleted

## 2020-02-05 ENCOUNTER — Encounter: Payer: Self-pay | Admitting: Family Medicine

## 2020-02-05 ENCOUNTER — Other Ambulatory Visit: Payer: Self-pay | Admitting: *Deleted

## 2020-02-05 ENCOUNTER — Other Ambulatory Visit: Payer: Self-pay

## 2020-02-05 ENCOUNTER — Ambulatory Visit (INDEPENDENT_AMBULATORY_CARE_PROVIDER_SITE_OTHER): Payer: 59 | Admitting: Family Medicine

## 2020-02-05 VITALS — BP 122/70 | HR 81 | Temp 98.0°F | Resp 12 | Ht 63.0 in | Wt 168.6 lb

## 2020-02-05 DIAGNOSIS — F17200 Nicotine dependence, unspecified, uncomplicated: Secondary | ICD-10-CM | POA: Diagnosis not present

## 2020-02-05 DIAGNOSIS — R002 Palpitations: Secondary | ICD-10-CM

## 2020-02-05 DIAGNOSIS — E669 Obesity, unspecified: Secondary | ICD-10-CM

## 2020-02-05 DIAGNOSIS — E782 Mixed hyperlipidemia: Secondary | ICD-10-CM

## 2020-02-05 DIAGNOSIS — E1169 Type 2 diabetes mellitus with other specified complication: Secondary | ICD-10-CM

## 2020-02-05 DIAGNOSIS — F418 Other specified anxiety disorders: Secondary | ICD-10-CM

## 2020-02-05 LAB — CBC
HCT: 44.1 % (ref 36.0–46.0)
Hemoglobin: 14.9 g/dL (ref 12.0–15.0)
MCHC: 33.9 g/dL (ref 30.0–36.0)
MCV: 91.8 fl (ref 78.0–100.0)
Platelets: 258 10*3/uL (ref 150.0–400.0)
RBC: 4.81 Mil/uL (ref 3.87–5.11)
RDW: 12.9 % (ref 11.5–15.5)
WBC: 8.2 10*3/uL (ref 4.0–10.5)

## 2020-02-05 LAB — COMPREHENSIVE METABOLIC PANEL
ALT: 13 U/L (ref 0–35)
AST: 15 U/L (ref 0–37)
Albumin: 4.5 g/dL (ref 3.5–5.2)
Alkaline Phosphatase: 58 U/L (ref 39–117)
BUN: 21 mg/dL (ref 6–23)
CO2: 27 mEq/L (ref 19–32)
Calcium: 9.4 mg/dL (ref 8.4–10.5)
Chloride: 104 mEq/L (ref 96–112)
Creatinine, Ser: 0.91 mg/dL (ref 0.40–1.20)
GFR: 63.33 mL/min (ref >60.00)
Glucose, Bld: 121 mg/dL — ABNORMAL HIGH (ref 70–99)
Potassium: 4.1 mEq/L (ref 3.5–5.1)
Sodium: 139 mEq/L (ref 135–145)
Total Bilirubin: 0.3 mg/dL (ref 0.2–1.2)
Total Protein: 7.2 g/dL (ref 6.0–8.3)

## 2020-02-05 LAB — LIPID PANEL
Cholesterol: 231 mg/dL — ABNORMAL HIGH (ref 0–200)
HDL: 61.1 mg/dL (ref >39.00)
LDL Cholesterol: 132 mg/dL — ABNORMAL HIGH (ref 0–99)
NonHDL: 170.19
Total CHOL/HDL Ratio: 4
Triglycerides: 189 mg/dL — ABNORMAL HIGH (ref 0.0–149.0)
VLDL: 37.8 mg/dL (ref 0.0–40.0)

## 2020-02-05 LAB — HEMOGLOBIN A1C: Hgb A1c MFr Bld: 7.5 % — ABNORMAL HIGH (ref 4.6–6.5)

## 2020-02-05 LAB — TSH: TSH: 2.04 u[IU]/mL (ref 0.35–4.50)

## 2020-02-05 LAB — VITAMIN D 25 HYDROXY (VIT D DEFICIENCY, FRACTURES): VITD: 45.1 ng/mL (ref 30.00–100.00)

## 2020-02-05 MED ORDER — SERTRALINE HCL 100 MG PO TABS: 100.0000 mg | ORAL_TABLET | Freq: Every day | ORAL | 1 refills | Status: DC

## 2020-02-05 NOTE — Assessment & Plan Note (Signed)
Encouraged heart healthy diet, increase exercise, avoid trans fats, consider a krill oil cap daily 

## 2020-02-05 NOTE — Assessment & Plan Note (Addendum)
hgba1c unacceptable, minimize simple carbs. Increase exercise as tolerated. Continue current meds and recheck labs today and in 3 months. She has notably cut down carbohydrates and feels better

## 2020-02-05 NOTE — Assessment & Plan Note (Signed)
Is down below a PPD but not down below 1/2 ppd. Encouraged to attempt complete cessation.

## 2020-02-05 NOTE — Assessment & Plan Note (Addendum)
Is doing about the same, is still living with her boyfriend who has an 59 year old autistic daughter. This has been very stressful but they are going to try a medication soon and see if that helps so she can decide if she can stay with him. She also endorses fatigue and asks that her lab work have a vitamin D added. A refill given

## 2020-02-05 NOTE — Progress Notes (Signed)
Subjective:    Patient ID: Ashley Jackson, female    DOB: [birth year], 59 y.o.   MRN: [id number]  Chief Complaint  Patient presents with  . Follow-up    HPI Patient is in today for follow up on chronic medical concerns. No recent febrile illness or hospitalizations. No acute concerns. She would like to have her vitamin D checked. She has been eating better since her last visit and nearly completely cut out carbs. No acute concerns. She declines COVID vaccines. Also declines shingrix shots. Denies CP/palp/SOB/HA/congestion/fevers/GI or GU c/o. Taking meds as prescribed  Past Medical History:  Diagnosis Date  . Allergic state 01/24/2017  . Anxiety state, unspecified 07/12/2007  . COLONIC POLYPS, HX OF 05/02/2007  . DEPRESSION 07/12/2007  . DIABETES MELLITUS, TYPE II, UNCONTROLLED 02/25/2010  . EXOGENOUS OBESITY 11/07/2007  . Hematuria 06/03/2017  . HYPERGLYCEMIA 07/12/2007  . HYPERLIPIDEMIA 02/07/2008  . Low back pain 04/29/2013  . Overweight(278.02) 06/24/2010  . Palpitations 01/20/2016  . Preventative health care 04/29/2013  . Tobacco abuse 10/24/2012  . TOBACCO USER 06/24/2010    Past Surgical History:  Procedure Laterality Date  . COLONOSCOPY  2006    Family History  Problem Relation Age of Onset  . Liver cancer Mother   . Hypertension Other   . Hypertension Father   . Stroke Brother   . Seizures Son   . Diabetes Maternal Uncle   . Breast cancer Neg Hx   . Prostate cancer Neg Hx   . Heart disease Neg Hx     Social History   Socioeconomic History  . Marital status: Divorced    Spouse name: Not on file  . Number of children: Not on file  . Years of education: Not on file  . Highest education level: Not on file  Occupational History  . Not on file  Tobacco Use  . Smoking status: Current Every Day Smoker    Packs/day: 1.00  . Smokeless tobacco: Never Used  Vaping Use  . Vaping Use: Never used  Substance and Sexual Activity  . Alcohol use: Not Currently  . Drug use: No   . Sexual activity: Not Currently  Other Topics Concern  . Not on file  Social History Narrative  . Not on file   Social Determinants of Health   Financial Resource Strain:   . Difficulty of Paying Living Expenses:   Food Insecurity:   . Worried About Charity fundraiser in the Last Year:   . Arboriculturist in the Last Year:   Transportation Needs:   . Film/video editor (Medical):   Marland Kitchen Lack of Transportation (Non-Medical):   Physical Activity:   . Days of Exercise per Week:   . Minutes of Exercise per Session:   Stress:   . Feeling of Stress :   Social Connections:   . Frequency of Communication with Friends and Family:   . Frequency of Social Gatherings with Friends and Family:   . Attends Religious Services:   . Active Member of Clubs or Organizations:   . Attends Archivist Meetings:   Marland Kitchen Marital Status:   Intimate Partner Violence:   . Fear of Current or Ex-Partner:   . Emotionally Abused:   Marland Kitchen Physically Abused:   . Sexually Abused:     Outpatient Medications Prior to Visit  Medication Sig Dispense Refill  . ALPRAZolam (XANAX) 0.5 MG tablet Take 1 tablet (0.5 mg total) by mouth 4 (four) times daily as needed  for anxiety. 120 tablet 5  . Blood Glucose Calibration (PRECISION GLUCOSE CONTROL SOLN) SOLN Use as directed 1 each 0  . glucose blood (PRECISION XTRA TEST STRIPS) test strip Use as directed once a day and as directed.  Dx code: E11.9 50 each 3  . glucose monitoring kit (FREESTYLE) monitoring kit 1 each by Does not apply route as needed for other. 1 each 0  . Lancets (FREESTYLE) lancets Use as instructed 100 each 12  . sertraline (ZOLOFT) 100 MG tablet Take 1 tablet (100 mg total) by mouth daily. 90 tablet 1   No facility-administered medications prior to visit.    Allergies  Allergen Reactions  . Atarax [Hydroxyzine] Shortness Of Breath    Anxiety, shortness of breath, tachycardia   . Cortisone     HEART RACING  . Levofloxacin     anxiety   . Sulfa Antibiotics     Panic attacks  . Sulfonamide Derivatives     REACTION: Panic attacks  . Levofloxacin Anxiety    Review of Systems  Constitutional: Positive for malaise/fatigue. Negative for fever.  HENT: Negative for congestion.   Eyes: Negative for blurred vision.  Respiratory: Negative for shortness of breath.   Cardiovascular: Positive for claudication. Negative for chest pain, palpitations and leg swelling.  Gastrointestinal: Negative for abdominal pain, blood in stool and nausea.  Genitourinary: Negative for dysuria and frequency.  Musculoskeletal: Negative for falls.  Skin: Negative for rash.  Neurological: Negative for dizziness, loss of consciousness and headaches.  Endo/Heme/Allergies: Negative for environmental allergies.  Psychiatric/Behavioral: Positive for depression. The patient has insomnia.        Objective:    Physical Exam  BP 122/70 (BP Location: Right Arm, Cuff Size: Large)   Pulse 81   Temp 98 F (36.7 C) (Temporal)   Resp 12   Ht _0  (1.6 m)   Wt 168 lb 9.6 oz (76.5 kg)   LMP 12/09/2015   SpO2 98%   BMI 29.87 kg/m  Wt Readings from Last 3 Encounters:  02/05/20 168 lb 9.6 oz (76.5 kg)  11/01/19 182 lb (82.6 kg)  09/26/19 175 lb (79.4 kg)    Diabetic Foot Exam - Simple   No data filed     Lab Results  Component Value Date   WBC 8.0 11/01/2019   HGB 14.7 11/01/2019   HCT 43.8 11/01/2019   PLT 255.0 11/01/2019   GLUCOSE 256 (H) 11/01/2019   CHOL 228 (H) 11/01/2019   TRIG 229.0 (H) 11/01/2019   HDL 67.60 11/01/2019   LDLDIRECT 134.0 11/01/2019   LDLCALC 149 (H) 04/19/2019   ALT 32 11/01/2019   AST 28 11/01/2019   NA 137 11/01/2019   K 3.9 11/01/2019   CL 102 11/01/2019   CREATININE 0.84 11/01/2019   BUN 12 11/01/2019   CO2 27 11/01/2019   TSH 1.33 11/01/2019   HGBA1C 9.0 (H) 11/13/2019   MICROALBUR 0.72 10/20/2012    Lab Results  Component Value Date   TSH 1.33 11/01/2019   Lab Results  Component Value Date    WBC 8.0 11/01/2019   HGB 14.7 11/01/2019   HCT 43.8 11/01/2019   MCV 91.0 11/01/2019   PLT 255.0 11/01/2019   Lab Results  Component Value Date   NA 137 11/01/2019   K 3.9 11/01/2019   CO2 27 11/01/2019   GLUCOSE 256 (H) 11/01/2019   BUN 12 11/01/2019   CREATININE 0.84 11/01/2019   BILITOT 0.3 11/01/2019   ALKPHOS 67 11/01/2019  AST 28 11/01/2019   ALT 32 11/01/2019   PROT 7.4 11/01/2019   ALBUMIN 4.1 11/01/2019   CALCIUM 9.3 11/01/2019   GFR 69.52 11/01/2019   Lab Results  Component Value Date   CHOL 228 (H) 11/01/2019   Lab Results  Component Value Date   HDL 67.60 11/01/2019   Lab Results  Component Value Date   LDLCALC 149 (H) 04/19/2019   Lab Results  Component Value Date   TRIG 229.0 (H) 11/01/2019   Lab Results  Component Value Date   CHOLHDL 3 11/01/2019   Lab Results  Component Value Date   HGBA1C 9.0 (H) 11/13/2019       Assessment & Plan:   Problem List Items Addressed This Visit    Diabetes mellitus type 2 in obese ([facility])    hgba1c unacceptable, minimize simple carbs. Increase exercise as tolerated. Continue current meds and recheck labs today and in 3 months. She has notably cut down carbohydrates and feels better      Relevant Orders   Hemoglobin A1c   Comprehensive metabolic panel   VITAMIN D 25 Hydroxy (Vit-D Deficiency, Fractures)   Hyperlipidemia, mixed - Primary    Encouraged heart healthy diet, increase exercise, avoid trans fats, consider a krill oil cap daily      Relevant Orders   Lipid panel   VITAMIN D 25 Hydroxy (Vit-D Deficiency, Fractures)   Obesity    Good weight loss while diminishing her carbs to manage her sugars      TOBACCO USER    Is down below a PPD but not down below 1/2 ppd. Encouraged to attempt complete cessation.       Depression with anxiety    Is doing about the same, is still living with her boyfriend who has an 50 year old autistic daughter. This has been very stressful but they are going to try a  medication soon and see if that helps so she can decide if she can stay with him. She also endorses fatigue and asks that her lab work have a vitamin D added. A refill given       Relevant Medications   sertraline (ZOLOFT) 100 MG tablet   Palpitations   Relevant Orders   CBC   TSH   VITAMIN D 25 Hydroxy (Vit-D Deficiency, Fractures)      I am having Ashley Jackson maintain her ALPRAZolam, freestyle, glucose monitoring kit, Precision Glucose Control Soln, PRECISION XTRA TEST STRIPS, and sertraline.  Meds ordered this encounter  Medications  . sertraline (ZOLOFT) 100 MG tablet    Sig: Take 1 tablet (100 mg total) by mouth daily.    Dispense:  90 tablet    Refill:  1     Penni Homans, MD

## 2020-02-05 NOTE — Patient Instructions (Signed)
Tobacco Use Disorder Tobacco use disorder (TUD) occurs when a person craves, seeks, and uses tobacco, regardless of the consequences. This disorder can cause problems with mental and physical health. It can affect your ability to have healthy relationships, and it can keep you from meeting your responsibilities at work, home, or school. Tobacco may be:  Smoked as a cigarette or cigar.  Inhaled using e-cigarettes.  Smoked in a pipe or hookah.  Chewed as smokeless tobacco.  Inhaled into the nostrils as snuff. Tobacco products contain a dangerous chemical called nicotine, which is very addictive. Nicotine triggers hormones that make the body feel stimulated and works on areas of the brain that make you feel good. These effects can make it hard for people to quit nicotine. Tobacco contains many other unsafe chemicals that can damage almost every organ in the body. Smoking tobacco also puts others in danger due to fire risk and possible health problems caused by breathing in secondhand smoke. What are the signs or symptoms? Symptoms of TUD may include:  Being unable to slow down or stop your tobacco use.  Spending an abnormal amount of time getting or using tobacco.  Craving tobacco. Cravings may last for up to 6 months after quitting.  Tobacco use that: ? Interferes with your work, school, or home life. ? Interferes with your personal and social relationships. ? Makes you give up activities that you once enjoyed or found important.  Using tobacco even though you know that it is: ? Dangerous or bad for your health or someone else's health. ? Causing problems in your life.  Needing more and more of the substance to get the same effect (developing tolerance).  Experiencing unpleasant symptoms if you do not use the substance (withdrawal). Withdrawal symptoms may include: ? Depressed, anxious, or irritable mood. ? Difficulty concentrating. ? Increased appetite. ? Restlessness or trouble  sleeping.  Using the substance to avoid withdrawal. How is this diagnosed? This condition may be diagnosed based on:  Your current and past tobacco use. Your health care provider may ask questions about how your tobacco use affects your life.  A physical exam. You may be diagnosed with TUD if you have at least two symptoms within a 12-month period. How is this treated? This condition is treated by stopping tobacco use. Many people are unable to quit on their own and need help. Treatment may include:  Nicotine replacement therapy (NRT). NRT provides nicotine without the other harmful chemicals in tobacco. NRT gradually lowers the dosage of nicotine in the body and reduces withdrawal symptoms. NRT is available as: ? Over-the-counter gums, lozenges, and skin patches. ? Prescription mouth inhalers and nasal sprays.  Medicine that acts on the brain to reduce cravings and withdrawal symptoms.  A type of talk therapy that examines your triggers for tobacco use, how to avoid them, and how to cope with cravings (behavioral therapy).  Hypnosis. This may help with withdrawal symptoms.  Joining a support group for others coping with TUD. The best treatment for TUD is usually a combination of medicine, talk therapy, and support groups. Recovery can be a long process. Many people start using tobacco again after stopping (relapse). If you relapse, it does not mean that treatment will not work. Follow these instructions at home:  Lifestyle  Do not use any products that contain nicotine or tobacco, such as cigarettes and e-cigarettes.  Avoid things that trigger tobacco use as much as you can. Triggers include people and situations that usually cause you   to use tobacco.  Avoid drinks that contain caffeine, including coffee. These may worsen some withdrawal symptoms.  Find ways to manage stress. Wanting to smoke may cause stress, and stress can make you want to smoke. Relaxation techniques such as  deep breathing, meditation, and yoga may help.  Attend support groups as needed. These groups are an important part of long-term recovery for many people. General instructions  Take over-the-counter and prescription medicines only as told by your health care provider.  Check with your health care provider before taking any new prescription or over-the-counter medicines.  Decide on a friend, family member, or smoking quit-line (such as 1-800-QUIT-NOW in the [place] that you can call or text when you feel the urge to smoke or when you need help coping with cravings.  Keep all follow-up visits as told by your health care provider and therapist. This is important. Contact a health care provider if:  You are not able to take your medicines as prescribed.  Your symptoms get worse, even with treatment. Summary  Tobacco use disorder (TUD) occurs when a person craves, seeks, and uses tobacco regardless of the consequences.  This condition may be diagnosed based on your current and past tobacco use and a physical exam.  Many people are unable to quit on their own and need help. Recovery can be a long process.  The most effective treatment for TUD is usually a combination of medicine, talk therapy, and support groups. This information is not intended to replace advice given to you by your health care provider. Make sure you discuss any questions you have with your health care provider. Document Revised: 07/27/2017 Document Reviewed: 07/27/2017 Elsevier Patient Education  2020 [employer]  

## 2020-02-05 NOTE — Assessment & Plan Note (Signed)
Good weight loss while diminishing her carbs to manage her sugars

## 2020-04-11 ENCOUNTER — Encounter: Payer: Self-pay | Admitting: Family Medicine

## 2020-04-12 ENCOUNTER — Encounter: Payer: Self-pay | Admitting: Family Medicine

## 2020-04-15 ENCOUNTER — Other Ambulatory Visit: Payer: Self-pay | Admitting: Family Medicine

## 2020-04-15 DIAGNOSIS — M5442 Lumbago with sciatica, left side: Secondary | ICD-10-CM

## 2020-04-22 ENCOUNTER — Encounter: Payer: Self-pay | Admitting: Orthopaedic Surgery

## 2020-04-22 ENCOUNTER — Ambulatory Visit (INDEPENDENT_AMBULATORY_CARE_PROVIDER_SITE_OTHER): Payer: 59 | Admitting: Orthopaedic Surgery

## 2020-04-22 ENCOUNTER — Ambulatory Visit (INDEPENDENT_AMBULATORY_CARE_PROVIDER_SITE_OTHER): Payer: 59

## 2020-04-22 VITALS — Ht 63.0 in | Wt 169.2 lb

## 2020-04-22 DIAGNOSIS — M545 Low back pain, unspecified: Secondary | ICD-10-CM

## 2020-04-22 MED ORDER — METAXALONE 800 MG PO TABS
800.0000 mg | ORAL_TABLET | Freq: Three times a day (TID) | ORAL | 0 refills | Status: DC
Start: 2020-04-22 — End: 2020-08-25

## 2020-04-22 MED ORDER — IBUPROFEN 800 MG PO TABS: 800.0000 mg | ORAL_TABLET | Freq: Three times a day (TID) | ORAL | 2 refills | Status: DC | PRN
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
## 2020-04-23 ENCOUNTER — Encounter: Payer: Self-pay | Admitting: Orthopaedic Surgery

## 2020-04-25 ENCOUNTER — Other Ambulatory Visit: Payer: Self-pay | Admitting: Physician Assistant

## 2020-04-25 MED ORDER — METHOCARBAMOL 500 MG PO TABS: 500.0000 mg | ORAL_TABLET | Freq: Two times a day (BID) | ORAL | 0 refills | Status: DC | PRN

## 2020-04-25 NOTE — Telephone Encounter (Signed)
I sent in robaxin.  

## 2020-05-07 ENCOUNTER — Other Ambulatory Visit (INDEPENDENT_AMBULATORY_CARE_PROVIDER_SITE_OTHER): Payer: 59

## 2020-05-07 ENCOUNTER — Other Ambulatory Visit: Payer: Self-pay

## 2020-05-07 ENCOUNTER — Encounter: Payer: Self-pay | Admitting: Family Medicine

## 2020-05-07 DIAGNOSIS — E782 Mixed hyperlipidemia: Secondary | ICD-10-CM | POA: Diagnosis not present

## 2020-05-07 DIAGNOSIS — E669 Obesity, unspecified: Secondary | ICD-10-CM

## 2020-05-07 DIAGNOSIS — E1169 Type 2 diabetes mellitus with other specified complication: Secondary | ICD-10-CM

## 2020-05-07 NOTE — Addendum Note (Signed)
Addended by: Mervin Kung A on: 05/07/2020 09:43 AM   Modules accepted: Orders

## 2020-05-08 LAB — LIPID PANEL
Cholesterol: 212 mg/dL — ABNORMAL HIGH (ref <200)
HDL: 67 mg/dL (ref >=50)
LDL Cholesterol (Calc): 120 mg/dL (calc) — ABNORMAL HIGH
Non-HDL Cholesterol (Calc): 145 mg/dL (calc) — ABNORMAL HIGH (ref <130)
Total CHOL/HDL Ratio: 3.2 (calc) (ref <5.0)
Triglycerides: 133 mg/dL (ref <150)

## 2020-05-08 LAB — COMPREHENSIVE METABOLIC PANEL
AG Ratio: 1.5 (calc) (ref 1.0–2.5)
ALT: 11 U/L (ref 6–29)
AST: 15 U/L (ref 10–35)
Albumin: 4.2 g/dL (ref 3.6–5.1)
Alkaline phosphatase (APISO): 59 U/L (ref 37–153)
BUN: 16 mg/dL (ref 7–25)
CO2: 28 mmol/L (ref 20–32)
Calcium: 9 mg/dL (ref 8.6–10.4)
Chloride: 106 mmol/L (ref 98–110)
Creat: 0.93 mg/dL (ref 0.50–1.05)
Globulin: 2.8 g/dL (calc) (ref 1.9–3.7)
Glucose, Bld: 152 mg/dL — ABNORMAL HIGH (ref 65–99)
Potassium: 4.3 mmol/L (ref 3.5–5.3)
Sodium: 140 mmol/L (ref 135–146)
Total Bilirubin: 0.4 mg/dL (ref 0.2–1.2)
Total Protein: 7 g/dL (ref 6.1–8.1)

## 2020-05-08 LAB — HEMOGLOBIN A1C
Hgb A1c MFr Bld: 6.9 % of total Hgb — ABNORMAL HIGH (ref <5.7)
Mean Plasma Glucose: 151 (calc)
eAG (mmol/L): 8.4 (calc)

## 2020-05-12 ENCOUNTER — Ambulatory Visit: Payer: 59 | Admitting: Rehabilitative and Restorative Service Providers"

## 2020-05-14 ENCOUNTER — Other Ambulatory Visit: Payer: Self-pay | Admitting: Family Medicine

## 2020-05-14 DIAGNOSIS — Z79899 Other long term (current) drug therapy: Secondary | ICD-10-CM

## 2020-05-15 ENCOUNTER — Telehealth (INDEPENDENT_AMBULATORY_CARE_PROVIDER_SITE_OTHER): Payer: 59 | Admitting: Family Medicine

## 2020-05-15 ENCOUNTER — Ambulatory Visit: Payer: 59 | Admitting: Family Medicine

## 2020-05-15 ENCOUNTER — Other Ambulatory Visit: Payer: Self-pay

## 2020-05-15 DIAGNOSIS — E1169 Type 2 diabetes mellitus with other specified complication: Secondary | ICD-10-CM | POA: Diagnosis not present

## 2020-05-15 DIAGNOSIS — Z79899 Other long term (current) drug therapy: Secondary | ICD-10-CM

## 2020-05-15 DIAGNOSIS — R002 Palpitations: Secondary | ICD-10-CM

## 2020-05-15 DIAGNOSIS — E782 Mixed hyperlipidemia: Secondary | ICD-10-CM | POA: Diagnosis not present

## 2020-05-15 DIAGNOSIS — F418 Other specified anxiety disorders: Secondary | ICD-10-CM

## 2020-05-15 DIAGNOSIS — F17200 Nicotine dependence, unspecified, uncomplicated: Secondary | ICD-10-CM

## 2020-05-15 DIAGNOSIS — E669 Obesity, unspecified: Secondary | ICD-10-CM

## 2020-05-15 MED ORDER — ALPRAZOLAM 0.5 MG PO TABS: 0.5000 mg | ORAL_TABLET | Freq: Four times a day (QID) | ORAL | 1 refills | Status: DC | PRN

## 2020-05-15 MED ORDER — SERTRALINE HCL 100 MG PO TABS
100.0000 mg | ORAL_TABLET | Freq: Every day | ORAL | 1 refills | Status: DC
Start: 2020-05-15 — End: 2020-12-22

## 2020-05-15 NOTE — Telephone Encounter (Signed)
RequestingPrudy Feeler Contract:02/11/20 UDS:11/01/19 Last Visit:02/05/20 Next Visit:05/15/20 Last Refill:11/01/19  Please Advise

## 2020-05-17 NOTE — Assessment & Plan Note (Signed)
hgba1c acceptable, minimize simple carbs. Increase exercise as tolerated. Continue current meds, greatly improved

## 2020-05-17 NOTE — Assessment & Plan Note (Signed)
She is understress with her boyfriend in the hospital but she feels she is managing well most of the time. May continue Alprazolam prn

## 2020-05-17 NOTE — Progress Notes (Signed)
Virtual Visit via Video Note  I connected with Kathee Polite on 05/15/20 at  2:20 PM EDT by a video enabled telemedicine application and verified that I am speaking with the correct person using two identifiers.  Location: Patient: home, patient and provider in visit Provider: office   I discussed the limitations of evaluation and management by telemedicine and the availability of in person appointments. The patient expressed understanding and agreed to proceed. Nani Skillern, CMA was able to set the patient up on a video visit     Subjective:    Patient ID: Ashley Jackson, female    DOB: 1961-08-13, 59 y.o.   MRN: [id number]  Chief Complaint  Patient presents with  . Follow-up    lab    HPI Patient is in today for follow up on chronic medical concerns. No recent febrile illness or hospitalizations. She has been eating much better and is pleased with the improvement in her diet. She is eating less carbs and staying more active. No c/o polyuria or polydipsia. Denies CP/palp/SOB/HA/congestion/fevers/GI or GU c/o. Taking meds as prescribed. She has been stressed with her boyfriend in the hospital but overall she is managing well.   Past Medical History:  Diagnosis Date  . Allergic state 01/24/2017  . Anxiety state, unspecified 07/12/2007  . COLONIC POLYPS, HX OF 05/02/2007  . DEPRESSION 07/12/2007  . DIABETES MELLITUS, TYPE II, UNCONTROLLED 02/25/2010  . EXOGENOUS OBESITY 11/07/2007  . Hematuria 06/03/2017  . HYPERGLYCEMIA 07/12/2007  . HYPERLIPIDEMIA 02/07/2008  . Low back pain 04/29/2013  . Overweight(278.02) 06/24/2010  . Palpitations 01/20/2016  . Preventative health care 04/29/2013  . Tobacco abuse 10/24/2012  . TOBACCO USER 06/24/2010    Past Surgical History:  Procedure Laterality Date  . COLONOSCOPY  2006    Family History  Problem Relation Age of Onset  . Liver cancer Mother   . Hypertension Other   . Hypertension Father   . Stroke Brother   . Seizures Son   . Diabetes Maternal  Uncle   . Breast cancer Neg Hx   . Prostate cancer Neg Hx   . Heart disease Neg Hx     Social History   Socioeconomic History  . Marital status: Divorced    Spouse name: Not on file  . Number of children: Not on file  . Years of education: Not on file  . Highest education level: Not on file  Occupational History  . Not on file  Tobacco Use  . Smoking status: Current Every Day Smoker    Packs/day: 1.00  . Smokeless tobacco: Never Used  Vaping Use  . Vaping Use: Never used  Substance and Sexual Activity  . Alcohol use: Not Currently  . Drug use: No  . Sexual activity: Not Currently  Other Topics Concern  . Not on file  Social History Narrative  . Not on file   Social Determinants of Health   Financial Resource Strain:   . Difficulty of Paying Living Expenses: Not on file  Food Insecurity:   . Worried About Charity fundraiser in the Last Year: Not on file  . Ran Out of Food in the Last Year: Not on file  Transportation Needs:   . Lack of Transportation (Medical): Not on file  . Lack of Transportation (Non-Medical): Not on file  Physical Activity:   . Days of Exercise per Week: Not on file  . Minutes of Exercise per Session: Not on file  Stress:   . Feeling  of Stress : Not on file  Social Connections:   . Frequency of Communication with Friends and Family: Not on file  . Frequency of Social Gatherings with Friends and Family: Not on file  . Attends Religious Services: Not on file  . Active Member of Clubs or Organizations: Not on file  . Attends Archivist Meetings: Not on file  . Marital Status: Not on file  Intimate Partner Violence:   . Fear of Current or Ex-Partner: Not on file  . Emotionally Abused: Not on file  . Physically Abused: Not on file  . Sexually Abused: Not on file    Outpatient Medications Prior to Visit  Medication Sig Dispense Refill  . Blood Glucose Calibration (PRECISION GLUCOSE CONTROL SOLN) SOLN Use as directed 1 each 0  .  glucose blood (PRECISION XTRA TEST STRIPS) test strip Use as directed once a day and as directed.  Dx code: E11.9 50 each 3  . glucose monitoring kit (FREESTYLE) monitoring kit 1 each by Does not apply route as needed for other. 1 each 0  . ibuprofen (ADVIL) 800 MG tablet Take 1 tablet (800 mg total) by mouth every 8 (eight) hours as needed. 30 tablet 2  . Lancets (FREESTYLE) lancets Use as instructed 100 each 12  . methocarbamol (ROBAXIN) 500 MG tablet Take 1 tablet (500 mg total) by mouth 2 (two) times daily as needed. 20 tablet 0  . ALPRAZolam (XANAX) 0.5 MG tablet TAKE 1 TABLET (0.5 MG TOTAL) BY MOUTH 4 (FOUR) TIMES DAILY AS NEEDED FOR ANXIETY. 120 tablet 1  . sertraline (ZOLOFT) 100 MG tablet Take 1 tablet (100 mg total) by mouth daily. 90 tablet 1  . metaxalone (SKELAXIN) 800 MG tablet Take 1 tablet (800 mg total) by mouth 3 (three) times daily. 20 tablet 0   No facility-administered medications prior to visit.    Allergies  Allergen Reactions  . Atarax [Hydroxyzine] Shortness Of Breath    Anxiety, shortness of breath, tachycardia   . Cortisone     HEART RACING  . Levofloxacin     anxiety  . Sulfa Antibiotics     Panic attacks  . Sulfonamide Derivatives     REACTION: Panic attacks  . Levofloxacin Anxiety    Review of Systems  Constitutional: Negative for fever and malaise/fatigue.  HENT: Negative for congestion.   Eyes: Negative for blurred vision.  Respiratory: Negative for shortness of breath.   Cardiovascular: Negative for chest pain, palpitations and leg swelling.  Gastrointestinal: Negative for abdominal pain, blood in stool and nausea.  Genitourinary: Negative for dysuria and frequency.  Musculoskeletal: Negative for falls.  Skin: Negative for rash.  Neurological: Negative for dizziness, loss of consciousness and headaches.  Endo/Heme/Allergies: Negative for environmental allergies.  Psychiatric/Behavioral: Negative for depression. The patient is nervous/anxious.         Objective:    Physical Exam Constitutional:      Appearance: Normal appearance. She is not ill-appearing.  HENT:     Head: Normocephalic and atraumatic.     Right Ear: External ear normal.     Left Ear: External ear normal.     Nose: Nose normal.  Eyes:     General:        Right eye: No discharge.        Left eye: No discharge.  Pulmonary:     Effort: Pulmonary effort is normal.  Neurological:     Mental Status: She is alert and oriented to person, place, and time.  Psychiatric:        Behavior: Behavior normal.     LMP 12/09/2015  Wt Readings from Last 3 Encounters:  04/22/20 169 lb 3.2 oz (76.7 kg)  02/05/20 168 lb 9.6 oz (76.5 kg)  11/01/19 182 lb (82.6 kg)    Diabetic Foot Exam - Simple   No data filed     Lab Results  Component Value Date   WBC 8.2 02/05/2020   HGB 14.9 02/05/2020   HCT 44.1 02/05/2020   PLT 258.0 02/05/2020   GLUCOSE 152 (H) 05/07/2020   CHOL 212 (H) 05/07/2020   TRIG 133 05/07/2020   HDL 67 05/07/2020   LDLDIRECT 134.0 11/01/2019   LDLCALC 120 (H) 05/07/2020   ALT 11 05/07/2020   AST 15 05/07/2020   NA 140 05/07/2020   K 4.3 05/07/2020   CL 106 05/07/2020   CREATININE 0.93 05/07/2020   BUN 16 05/07/2020   CO2 28 05/07/2020   TSH 2.04 02/05/2020   HGBA1C 6.9 (H) 05/07/2020   MICROALBUR 0.72 10/20/2012    Lab Results  Component Value Date   TSH 2.04 02/05/2020   Lab Results  Component Value Date   WBC 8.2 02/05/2020   HGB 14.9 02/05/2020   HCT 44.1 02/05/2020   MCV 91.8 02/05/2020   PLT 258.0 02/05/2020   Lab Results  Component Value Date   NA 140 05/07/2020   K 4.3 05/07/2020   CO2 28 05/07/2020   GLUCOSE 152 (H) 05/07/2020   BUN 16 05/07/2020   CREATININE 0.93 05/07/2020   BILITOT 0.4 05/07/2020   ALKPHOS 58 02/05/2020   AST 15 05/07/2020   ALT 11 05/07/2020   PROT 7.0 05/07/2020   ALBUMIN 4.5 02/05/2020   CALCIUM 9.0 05/07/2020   GFR 63.33 02/05/2020   Lab Results  Component Value Date   CHOL  212 (H) 05/07/2020   Lab Results  Component Value Date   HDL 67 05/07/2020   Lab Results  Component Value Date   LDLCALC 120 (H) 05/07/2020   Lab Results  Component Value Date   TRIG 133 05/07/2020   Lab Results  Component Value Date   CHOLHDL 3.2 05/07/2020   Lab Results  Component Value Date   HGBA1C 6.9 (H) 05/07/2020       Assessment & Plan:   Problem List Items Addressed This Visit    Diabetes mellitus type 2 in obese ([facility])    hgba1c acceptable, minimize simple carbs. Increase exercise as tolerated. Continue current meds, greatly improved      Relevant Orders   Hemoglobin A1c   Comprehensive metabolic panel   Hyperlipidemia, mixed - Primary    Encouraged heart healthy diet, increase exercise, avoid trans fats, consider a krill oil cap daily      Relevant Orders   CBC   Lipid panel   TSH   TOBACCO USER    Continues to smoke and she does not feel ready to try and quit. Encouraged to keep considering quitting and let us know if she is ready to try.       Depression with anxiety    She is understress with her boyfriend in the hospital but she feels she is managing well most of the time. May continue Alprazolam prn      Relevant Medications   sertraline (ZOLOFT) 100 MG tablet   ALPRAZolam (XANAX) 0.5 MG tablet   Palpitations   Relevant Orders   CBC   TSH    Other Visit Diagnoses  High risk medication use       Relevant Medications   ALPRAZolam (XANAX) 0.5 MG tablet      I am having Kathee Polite maintain her freestyle, glucose monitoring kit, Precision Glucose Control Soln, PRECISION XTRA TEST STRIPS, metaxalone, ibuprofen, methocarbamol, sertraline, and ALPRAZolam.  Meds ordered this encounter  Medications  . sertraline (ZOLOFT) 100 MG tablet    Sig: Take 1 tablet (100 mg total) by mouth daily.    Dispense:  90 tablet    Refill:  1  . ALPRAZolam (XANAX) 0.5 MG tablet    Sig: Take 1 tablet (0.5 mg total) by mouth 4 (four) times daily as  needed for anxiety.    Dispense:  120 tablet    Refill:  1    This request is for a new prescription for a controlled substance as required by Federal/State law.      I discussed the assessment and treatment plan with the patient. The patient was provided an opportunity to ask questions and all were answered. The patient agreed with the plan and demonstrated an understanding of the instructions.   The patient was advised to call back or seek an in-person evaluation if the symptoms worsen or if the condition fails to improve as anticipated.  I provided 25 minutes of face-to-face time during this encounter.   Penni Homans, MD

## 2020-05-17 NOTE — Assessment & Plan Note (Signed)
Encouraged heart healthy diet, increase exercise, avoid trans fats, consider a krill oil cap daily 

## 2020-05-17 NOTE — Assessment & Plan Note (Signed)
Continues to smoke and she does not feel ready to try and quit. Encouraged to keep considering quitting and let us know if she is ready to try.

## 2020-05-20 ENCOUNTER — Telehealth: Payer: Self-pay | Admitting: *Deleted

## 2020-05-20 NOTE — Telephone Encounter (Signed)
Left message on machine to call back  Needs lab only appointment on or around 08/19/20.

## 2020-06-03 ENCOUNTER — Ambulatory Visit: Payer: 59 | Admitting: Orthopaedic Surgery

## 2020-06-05 ENCOUNTER — Ambulatory Visit: Payer: Self-pay

## 2020-06-05 ENCOUNTER — Encounter: Payer: Self-pay | Admitting: Orthopaedic Surgery

## 2020-06-05 ENCOUNTER — Ambulatory Visit (INDEPENDENT_AMBULATORY_CARE_PROVIDER_SITE_OTHER): Payer: 59 | Admitting: Orthopaedic Surgery

## 2020-06-05 DIAGNOSIS — M9261 Juvenile osteochondrosis of tarsus, right ankle: Secondary | ICD-10-CM | POA: Diagnosis not present

## 2020-06-05 DIAGNOSIS — M7661 Achilles tendinitis, right leg: Secondary | ICD-10-CM | POA: Diagnosis not present

## 2020-06-05 NOTE — Progress Notes (Signed)
Office Visit Note   Patient: Ashley Jackson           Date of Birth: 12-Nov-1960           MRN: [id number] Visit Date: 06/05/2020              Requested by: Bradd Canary, MD [id number] [place],  [place] [address] PCP: Bradd Canary, MD   Assessment & Plan: Visit Diagnoses:  1. Achilles tendinitis, right leg   2. Haglund's deformity of right heel     Plan: Impression is right heel Haglund's deformity and Achilles tendinitis.  We have discussed cam walker, physical therapy, Medrol Dosepak followed by NSAIDs.  The patient does not want any medications.  She has agreed to wear a cam walker.  She also politely declined formal physical therapy.  We have provided her with an Achilles stretching program.  She will follow up with [place] in 4 weeks time for recheck.  Follow-Up Instructions: Return in about 4 weeks (around 07/03/2020).   Orders:  Orders Placed This Encounter  Procedures   XR Ankle Complete Right   No orders of the defined types were placed in this encounter.     Procedures: No procedures performed   Clinical Data: No additional findings.   Subjective: Chief Complaint  Patient presents with   Right Ankle - Pain    HPI patient is a very pleasant 59 year old female who comes in today with right retrocalcaneal pain for the past several months.  The pain she has occurs only with walking.  She denies any pain at rest.  She has tried wearing an ankle brace as well as using ice and heat without relief of symptoms.  She has tried NSAIDs without any relief.  Review of Systems as detailed in HPI.  All others reviewed and are negative.   Objective: Vital Signs: LMP 12/09/2015   Physical Exam well-developed well-nourished female no acute distress.  Alert and oriented x3.  Ortho Exam right heel exam shows very prominent area to the retrocalcaneus.  This is moderately tender.  No tenderness to the pre-Achilles bursa.  She has approximately 15 degrees of  dorsiflexion.  She is neurovascular intact distally.  Specialty Comments:  No specialty comments available.  Imaging: XR Ankle Complete Right  Result Date: 06/05/2020 Fairly prominent retrocalcaneal spur.  Otherwise no acute findings    PMFS History: Patient Active Problem List   Diagnosis Date Noted   Neck pain 07/07/2018   Acute pain of right shoulder 03/12/2018   Rotator cuff tendonitis 01/24/2018   Bursitis of right knee 01/24/2018   Hematuria 06/03/2017   Palpitations 01/20/2016   Preventative health care 04/29/2013   Low back pain 04/29/2013   Elevated blood pressure 12/01/2011   TOBACCO USER 06/24/2010   Diabetes mellitus type 2 in obese ([facility]) 02/25/2010   Hyperlipidemia, mixed 02/07/2008   Obesity 11/07/2007   Depression with anxiety 07/12/2007   COLONIC POLYPS, HX OF 05/02/2007   Past Medical History:  Diagnosis Date   Allergic state 01/24/2017   Anxiety state, unspecified 07/12/2007   COLONIC POLYPS, HX OF 05/02/2007   DEPRESSION 07/12/2007   DIABETES MELLITUS, TYPE II, UNCONTROLLED 02/25/2010   EXOGENOUS OBESITY 11/07/2007   Hematuria 06/03/2017   HYPERGLYCEMIA 07/12/2007   HYPERLIPIDEMIA 02/07/2008   Low back pain 04/29/2013   Overweight(278.02) 06/24/2010   Palpitations 01/20/2016   Preventative health care 04/29/2013   Tobacco abuse 10/24/2012   TOBACCO USER 06/24/2010  Family History  Problem Relation Age of Onset   Liver cancer Mother    Hypertension Other    Hypertension Father    Stroke Brother    Seizures Son    Diabetes Maternal Uncle    Breast cancer Neg Hx    Prostate cancer Neg Hx    Heart disease Neg Hx     Past Surgical History:  Procedure Laterality Date   COLONOSCOPY  2006   Social History   Occupational History   Not on file  Tobacco Use   Smoking status: Current Every Day Smoker    Packs/day: 1.00   Smokeless tobacco: Never Used  Vaping Use   Vaping Use: Never used  Substance and  Sexual Activity   Alcohol use: Not Currently   Drug use: No   Sexual activity: Not Currently

## 2020-07-03 ENCOUNTER — Other Ambulatory Visit: Payer: Self-pay

## 2020-07-03 ENCOUNTER — Ambulatory Visit (INDEPENDENT_AMBULATORY_CARE_PROVIDER_SITE_OTHER): Payer: 59 | Admitting: Orthopaedic Surgery

## 2020-07-03 ENCOUNTER — Encounter: Payer: Self-pay | Admitting: Orthopaedic Surgery

## 2020-07-03 DIAGNOSIS — M9261 Juvenile osteochondrosis of tarsus, right ankle: Secondary | ICD-10-CM

## 2020-07-03 DIAGNOSIS — M7661 Achilles tendinitis, right leg: Secondary | ICD-10-CM | POA: Diagnosis not present

## 2020-07-03 NOTE — Progress Notes (Signed)
Office Visit Note   Patient: Ashley Jackson           Date of Birth: February 05, 1961           MRN: [id number] Visit Date: 07/03/2020              Requested by: Bradd Canary, MD [address] [place],  [place] [address] PCP: Bradd Canary, MD   Assessment & Plan: Visit Diagnoses:  1. Achilles tendinitis, right leg   2. Haglund's deformity of right heel     Plan: Impression continued right Achilles tendinosis and Haglund's deformity.  Patient understands that she will have to put more effort into conservative treatment.  We spoke at length about a cam boot if she feels that she needs it.  We also talked about physical therapy and the home exercises as well as Voltaren gel.  We also talked about the details of the surgery and the recovery that is involved.  She will continue with conservative treatment for now.  Follow-up as needed.  Total face to face encounter time was greater than 25 minutes and over half of this time was spent in counseling and/or coordination of care.  Follow-Up Instructions: Return if symptoms worsen or fail to improve.   Orders:  No orders of the defined types were placed in this encounter.  No orders of the defined types were placed in this encounter.     Procedures: No procedures performed   Clinical Data: No additional findings.   Subjective: Chief Complaint  Patient presents with  . Right Ankle - Pain    [name] is following up for her right Achilles tendinosis and Haglund's deformity.  She has continued pain when going up and down stairs and walking long distances and the pain is more of an annoyance.  She admits to not do her exercises more regularly.  She is unable to take NSAIDs.  Denies any changes.   Review of Systems  Constitutional: Negative.   HENT: Negative.   Eyes: Negative.   Respiratory: Negative.   Cardiovascular: Negative.   Endocrine: Negative.   Musculoskeletal: Negative.   Neurological: Negative.    Hematological: Negative.   Psychiatric/Behavioral: Negative.   All other systems reviewed and are negative.    Objective: Vital Signs: LMP 12/09/2015   Physical Exam Vitals and nursing note reviewed.  Constitutional:      Appearance: She is well-developed.  Pulmonary:     Effort: Pulmonary effort is normal.  Skin:    General: Skin is warm.     Capillary Refill: Capillary refill takes less than 2 seconds.  Neurological:     Mental Status: She is alert and oriented to person, place, and time.  Psychiatric:        Behavior: Behavior normal.        Thought Content: Thought content normal.        Judgment: Judgment normal.     Ortho Exam Right heel and ankle exam are unchanged. Specialty Comments:  No specialty comments available.  Imaging: No results found.   PMFS History: Patient Active Problem List   Diagnosis Date Noted  . Neck pain 07/07/2018  . Acute pain of right shoulder 03/12/2018  . Rotator cuff tendonitis 01/24/2018  . Bursitis of right knee 01/24/2018  . Hematuria 06/03/2017  . Palpitations 01/20/2016  . Preventative health care 04/29/2013  . Low back pain 04/29/2013  . Elevated blood pressure 12/01/2011  . TOBACCO USER 06/24/2010  .  Diabetes mellitus type 2 in obese ([facility]) 02/25/2010  . Hyperlipidemia, mixed 02/07/2008  . Obesity 11/07/2007  . Depression with anxiety 07/12/2007  . COLONIC POLYPS, HX OF 05/02/2007   Past Medical History:  Diagnosis Date  . Allergic state 01/24/2017  . Anxiety state, unspecified 07/12/2007  . COLONIC POLYPS, HX OF 05/02/2007  . DEPRESSION 07/12/2007  . DIABETES MELLITUS, TYPE II, UNCONTROLLED 02/25/2010  . EXOGENOUS OBESITY 11/07/2007  . Hematuria 06/03/2017  . HYPERGLYCEMIA 07/12/2007  . HYPERLIPIDEMIA 02/07/2008  . Low back pain 04/29/2013  . Overweight(278.02) 06/24/2010  . Palpitations 01/20/2016  . Preventative health care 04/29/2013  . Tobacco abuse 10/24/2012  . TOBACCO USER 06/24/2010    Family History  Problem  Relation Age of Onset  . Liver cancer Mother   . Hypertension Other   . Hypertension Father   . Stroke Brother   . Seizures Son   . Diabetes Maternal Uncle   . Breast cancer Neg Hx   . Prostate cancer Neg Hx   . Heart disease Neg Hx     Past Surgical History:  Procedure Laterality Date  . COLONOSCOPY  2006   Social History   Occupational History  . Not on file  Tobacco Use  . Smoking status: Current Every Day Smoker    Packs/day: 1.00  . Smokeless tobacco: Never Used  Vaping Use  . Vaping Use: Never used  Substance and Sexual Activity  . Alcohol use: Not Currently  . Drug use: No  . Sexual activity: Not Currently
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
## 2020-08-13 NOTE — Addendum Note (Signed)
Addended by: Mervin Kung A on: 08/13/2020 03:34 PM   Modules accepted: Orders

## 2020-08-18 ENCOUNTER — Other Ambulatory Visit (INDEPENDENT_AMBULATORY_CARE_PROVIDER_SITE_OTHER): Payer: 59

## 2020-08-18 ENCOUNTER — Other Ambulatory Visit: Payer: Self-pay

## 2020-08-18 DIAGNOSIS — E669 Obesity, unspecified: Secondary | ICD-10-CM | POA: Diagnosis not present

## 2020-08-18 DIAGNOSIS — E782 Mixed hyperlipidemia: Secondary | ICD-10-CM

## 2020-08-18 DIAGNOSIS — E1169 Type 2 diabetes mellitus with other specified complication: Secondary | ICD-10-CM | POA: Diagnosis not present

## 2020-08-18 DIAGNOSIS — R002 Palpitations: Secondary | ICD-10-CM

## 2020-08-18 LAB — COMPREHENSIVE METABOLIC PANEL
ALT: 12 U/L (ref 0–35)
AST: 13 U/L (ref 0–37)
Albumin: 4.2 g/dL (ref 3.5–5.2)
Alkaline Phosphatase: 58 U/L (ref 39–117)
BUN: 17 mg/dL (ref 6–23)
CO2: 27 mEq/L (ref 19–32)
Calcium: 9.3 mg/dL (ref 8.4–10.5)
Chloride: 104 mEq/L (ref 96–112)
Creatinine, Ser: 0.88 mg/dL (ref 0.40–1.20)
GFR: 72.01 mL/min (ref >60.00)
Glucose, Bld: 173 mg/dL — ABNORMAL HIGH (ref 70–99)
Potassium: 4.3 mEq/L (ref 3.5–5.1)
Sodium: 139 mEq/L (ref 135–145)
Total Bilirubin: 0.4 mg/dL (ref 0.2–1.2)
Total Protein: 7 g/dL (ref 6.0–8.3)

## 2020-08-18 LAB — CBC
HCT: 46 % (ref 36.0–46.0)
Hemoglobin: 15.3 g/dL — ABNORMAL HIGH (ref 12.0–15.0)
MCHC: 33.2 g/dL (ref 30.0–36.0)
MCV: 91.9 fl (ref 78.0–100.0)
Platelets: 256 10*3/uL (ref 150.0–400.0)
RBC: 5 Mil/uL (ref 3.87–5.11)
RDW: 12.6 % (ref 11.5–15.5)
WBC: 7 10*3/uL (ref 4.0–10.5)

## 2020-08-18 LAB — LIPID PANEL
Cholesterol: 235 mg/dL — ABNORMAL HIGH (ref 0–200)
HDL: 65.7 mg/dL (ref >39.00)
LDL Cholesterol: 140 mg/dL — ABNORMAL HIGH (ref 0–99)
NonHDL: 168.82
Total CHOL/HDL Ratio: 4
Triglycerides: 143 mg/dL (ref 0.0–149.0)
VLDL: 28.6 mg/dL (ref 0.0–40.0)

## 2020-08-18 LAB — TSH: TSH: 2.44 u[IU]/mL (ref 0.35–4.50)

## 2020-08-18 LAB — HEMOGLOBIN A1C: Hgb A1c MFr Bld: 7.2 % — ABNORMAL HIGH (ref 4.6–6.5)

## 2020-08-25 ENCOUNTER — Ambulatory Visit (INDEPENDENT_AMBULATORY_CARE_PROVIDER_SITE_OTHER): Payer: 59 | Admitting: Family Medicine

## 2020-08-25 ENCOUNTER — Encounter: Payer: Self-pay | Admitting: Family Medicine

## 2020-08-25 ENCOUNTER — Other Ambulatory Visit: Payer: Self-pay

## 2020-08-25 DIAGNOSIS — E782 Mixed hyperlipidemia: Secondary | ICD-10-CM | POA: Diagnosis not present

## 2020-08-25 DIAGNOSIS — I10 Essential (primary) hypertension: Secondary | ICD-10-CM

## 2020-08-25 DIAGNOSIS — F418 Other specified anxiety disorders: Secondary | ICD-10-CM | POA: Diagnosis not present

## 2020-08-25 DIAGNOSIS — M7661 Achilles tendinitis, right leg: Secondary | ICD-10-CM | POA: Insufficient documentation

## 2020-08-25 DIAGNOSIS — E669 Obesity, unspecified: Secondary | ICD-10-CM

## 2020-08-25 DIAGNOSIS — E1169 Type 2 diabetes mellitus with other specified complication: Secondary | ICD-10-CM | POA: Diagnosis not present

## 2020-08-25 DIAGNOSIS — F17200 Nicotine dependence, unspecified, uncomplicated: Secondary | ICD-10-CM

## 2020-08-25 NOTE — Assessment & Plan Note (Signed)
Encouraged heart healthy diet, increase exercise, avoid trans fats, consider a krill oil cap daily 

## 2020-08-25 NOTE — Assessment & Plan Note (Signed)
She has been seen by [facility], Dr Roda Shutters, they are trying to avoid surgery but it is flared due to walking up stairs while moving yesterday. Try icing and Lidocaine tid to ankle this week

## 2020-08-25 NOTE — Assessment & Plan Note (Signed)
hgba1c acceptable, minimize simple carbs. Increase exercise as tolerated. Continue current meds 

## 2020-08-25 NOTE — Assessment & Plan Note (Signed)
Recently broke up with her fiancee and she has just moved into a new apartment yesterday so she is very stressed. She is managing on her current meds no changes

## 2020-08-25 NOTE — Assessment & Plan Note (Signed)
Well controlled. Encouraged heart healthy diet such as the DASH diet and exercise as tolerated.  

## 2020-08-25 NOTE — Assessment & Plan Note (Addendum)
Encouraged complete cessation. Discussed need to quit as relates to risk of numerous cancers, cardiac and pulmonary disease as well as neurologic complications. Counseled for greater than 3 minutes. She declines current efforts. She has cut down some and is beginning to contemplate quitting this year.

## 2020-08-25 NOTE — Patient Instructions (Signed)

## 2020-08-25 NOTE — Progress Notes (Signed)
Subjective:    Patient ID: Ashley Jackson, female    DOB: 1961/05/10, 60 y.o.   MRN: [id number]  Chief Complaint  Patient presents with  . Diabetes    Pt here for follow up and not currently checking blood sugars.  . Hyperlipidemia    Pt here for follow up  . Depression    Pt here for follow up of depression and anxiety. States situation has not improved but doesn't feel it has worsened.    HPI Patient is in today for follow-up on chronic medical concerns.  No recent febrile illness or hospitalizations.  She did move into a new apartment on the second floor yesterday secondary to breaking up with her long-term fianc.  She is very stressed but ready to move forward.  She notes also moving and walking up and down the stairs is really flared her right heel pain.  She is following with Dr. Erlinda Hong of orthopedics and they are trying to avoid surgery.  No recent fall or trauma.  She feels she is managing her stress fairly well and declines any referral.  She has cut down on her smoking slightly and hopes to cut down more this coming year. Denies CP/palp/SOB/HA/congestion/fevers/GI or GU c/o. Taking meds as prescribed  Past Medical History:  Diagnosis Date  . Allergic state 01/24/2017  . Anxiety state, unspecified 07/12/2007  . COLONIC POLYPS, HX OF 05/02/2007  . DEPRESSION 07/12/2007  . DIABETES MELLITUS, TYPE II, UNCONTROLLED 02/25/2010  . EXOGENOUS OBESITY 11/07/2007  . Hematuria 06/03/2017  . HYPERGLYCEMIA 07/12/2007  . HYPERLIPIDEMIA 02/07/2008  . Low back pain 04/29/2013  . Overweight(278.02) 06/24/2010  . Palpitations 01/20/2016  . Preventative health care 04/29/2013  . Tobacco abuse 10/24/2012  . TOBACCO USER 06/24/2010    Past Surgical History:  Procedure Laterality Date  . COLONOSCOPY  2006    Family History  Problem Relation Age of Onset  . Liver cancer Mother   . Hypertension Other   . Hypertension Father   . Stroke Brother   . Seizures Son   . Diabetes Maternal Uncle   . Breast  cancer Neg Hx   . Prostate cancer Neg Hx   . Heart disease Neg Hx     Social History   Socioeconomic History  . Marital status: Divorced    Spouse name: Not on file  . Number of children: Not on file  . Years of education: Not on file  . Highest education level: Not on file  Occupational History  . Not on file  Tobacco Use  . Smoking status: Current Every Day Smoker    Packs/day: 1.00  . Smokeless tobacco: Never Used  Vaping Use  . Vaping Use: Never used  Substance and Sexual Activity  . Alcohol use: Not Currently  . Drug use: No  . Sexual activity: Not Currently  Other Topics Concern  . Not on file  Social History Narrative  . Not on file   Social Determinants of Health   Financial Resource Strain: Not on file  Food Insecurity: Not on file  Transportation Needs: Not on file  Physical Activity: Not on file  Stress: Not on file  Social Connections: Not on file  Intimate Partner Violence: Not on file    Outpatient Medications Prior to Visit  Medication Sig Dispense Refill  . ALPRAZolam (XANAX) 0.5 MG tablet Take 1 tablet (0.5 mg total) by mouth 4 (four) times daily as needed for anxiety. 120 tablet 1  . sertraline (ZOLOFT) 100  MG tablet Take 1 tablet (100 mg total) by mouth daily. 90 tablet 1  . Blood Glucose Calibration (PRECISION GLUCOSE CONTROL SOLN) SOLN Use as directed (Patient not taking: Reported on 08/25/2020) 1 each 0  . glucose blood (PRECISION XTRA TEST STRIPS) test strip Use as directed once a day and as directed.  Dx code: E11.9 (Patient not taking: Reported on 08/25/2020) 50 each 3  . glucose monitoring kit (FREESTYLE) monitoring kit 1 each by Does not apply route as needed for other. (Patient not taking: Reported on 08/25/2020) 1 each 0  . Lancets (FREESTYLE) lancets Use as instructed (Patient not taking: Reported on 08/25/2020) 100 each 12  . ibuprofen (ADVIL) 800 MG tablet Take 1 tablet (800 mg total) by mouth every 8 (eight) hours as needed. 30 tablet 2  .  metaxalone (SKELAXIN) 800 MG tablet Take 1 tablet (800 mg total) by mouth 3 (three) times daily. 20 tablet 0  . methocarbamol (ROBAXIN) 500 MG tablet Take 1 tablet (500 mg total) by mouth 2 (two) times daily as needed. 20 tablet 0   No facility-administered medications prior to visit.    Allergies  Allergen Reactions  . Atarax [Hydroxyzine] Shortness Of Breath    Anxiety, shortness of breath, tachycardia   . Cortisone     HEART RACING  . Levofloxacin     anxiety  . Sulfa Antibiotics     Panic attacks  . Sulfonamide Derivatives     REACTION: Panic attacks  . Levofloxacin Anxiety    Review of Systems  Constitutional: Negative for fever and malaise/fatigue.  HENT: Negative for congestion.   Eyes: Negative for blurred vision.  Respiratory: Negative for shortness of breath.   Cardiovascular: Negative for chest pain, palpitations and leg swelling.  Gastrointestinal: Negative for abdominal pain, blood in stool and nausea.  Genitourinary: Negative for dysuria and frequency.  Musculoskeletal: Negative for falls.  Skin: Negative for rash.  Neurological: Negative for dizziness, loss of consciousness and headaches.  Endo/Heme/Allergies: Negative for environmental allergies.  Psychiatric/Behavioral: Positive for depression. Negative for substance abuse and suicidal ideas. The patient is nervous/anxious.        Objective:    Physical Exam Vitals and nursing note reviewed.  Constitutional:      General: She is not in acute distress.    Appearance: She is well-developed and well-nourished.  HENT:     Head: Normocephalic and atraumatic.     Nose: Nose normal.  Eyes:     General:        Right eye: No discharge.        Left eye: No discharge.  Cardiovascular:     Rate and Rhythm: Normal rate and regular rhythm.     Heart sounds: No murmur heard.   Pulmonary:     Effort: Pulmonary effort is normal.     Breath sounds: Normal breath sounds.  Abdominal:     General: Bowel sounds  are normal.     Palpations: Abdomen is soft.     Tenderness: There is no abdominal tenderness.  Musculoskeletal:        General: No edema.     Cervical back: Normal range of motion and neck supple.  Skin:    General: Skin is warm and dry.  Neurological:     Mental Status: She is alert and oriented to person, place, and time.  Psychiatric:        Mood and Affect: Mood and affect normal.     BP 124/82 (BP Location: Left  Arm, Cuff Size: Normal)   Pulse 66   Temp 97.8 F (36.6 C) (Oral)   Resp 16   Ht _0  (1.6 m)   Wt 171 lb 12.8 oz (77.9 kg)   LMP 12/09/2015   SpO2 99%   BMI 30.43 kg/m  Wt Readings from Last 3 Encounters:  08/25/20 171 lb 12.8 oz (77.9 kg)  04/22/20 169 lb 3.2 oz (76.7 kg)  02/05/20 168 lb 9.6 oz (76.5 kg)    Diabetic Foot Exam - Simple   No data filed    Lab Results  Component Value Date   WBC 7.0 08/18/2020   HGB 15.3 (H) 08/18/2020   HCT 46.0 08/18/2020   PLT 256.0 08/18/2020   GLUCOSE 173 (H) 08/18/2020   CHOL 235 (H) 08/18/2020   TRIG 143.0 08/18/2020   HDL 65.70 08/18/2020   LDLDIRECT 134.0 11/01/2019   LDLCALC 140 (H) 08/18/2020   ALT 12 08/18/2020   AST 13 08/18/2020   NA 139 08/18/2020   K 4.3 08/18/2020   CL 104 08/18/2020   CREATININE 0.88 08/18/2020   BUN 17 08/18/2020   CO2 27 08/18/2020   TSH 2.44 08/18/2020   HGBA1C 7.2 (H) 08/18/2020   MICROALBUR 0.72 10/20/2012    Lab Results  Component Value Date   TSH 2.44 08/18/2020   Lab Results  Component Value Date   WBC 7.0 08/18/2020   HGB 15.3 (H) 08/18/2020   HCT 46.0 08/18/2020   MCV 91.9 08/18/2020   PLT 256.0 08/18/2020   Lab Results  Component Value Date   NA 139 08/18/2020   K 4.3 08/18/2020   CO2 27 08/18/2020   GLUCOSE 173 (H) 08/18/2020   BUN 17 08/18/2020   CREATININE 0.88 08/18/2020   BILITOT 0.4 08/18/2020   ALKPHOS 58 08/18/2020   AST 13 08/18/2020   ALT 12 08/18/2020   PROT 7.0 08/18/2020   ALBUMIN 4.2 08/18/2020   CALCIUM 9.3 08/18/2020    GFR 72.01 08/18/2020   Lab Results  Component Value Date   CHOL 235 (H) 08/18/2020   Lab Results  Component Value Date   HDL 65.70 08/18/2020   Lab Results  Component Value Date   LDLCALC 140 (H) 08/18/2020   Lab Results  Component Value Date   TRIG 143.0 08/18/2020   Lab Results  Component Value Date   CHOLHDL 4 08/18/2020   Lab Results  Component Value Date   HGBA1C 7.2 (H) 08/18/2020       Assessment & Plan:   Problem List Items Addressed This Visit    Diabetes mellitus type 2 in obese ([place])    hgba1c acceptable, minimize simple carbs. Increase exercise as tolerated. Continue current meds      Relevant Orders   Hemoglobin A1c   Hyperlipidemia, mixed    Encouraged heart healthy diet, increase exercise, avoid trans fats, consider a krill oil cap daily      Relevant Orders   Lipid panel   TOBACCO USER    Encouraged complete cessation. Discussed need to quit as relates to risk of numerous cancers, cardiac and pulmonary disease as well as neurologic complications. Counseled for greater than 3 minutes. She declines current efforts. She has cut down some and is beginning to contemplate quitting this year.       Depression with anxiety    Recently broke up with her fiancee and she has just moved into a new apartment yesterday so she is very stressed. She is managing on her current meds  no changes      High blood pressure    Well controlled. Encouraged heart healthy diet such as the DASH diet and exercise as tolerated.       Relevant Orders   CBC   Comprehensive metabolic panel   TSH   Achilles tendinitis of right lower extremity    She has been seen by [facility], Dr Erlinda Hong, they are trying to avoid surgery but it is flared due to walking up stairs while moving yesterday. Try icing and Lidocaine tid to ankle this week         I have discontinued Garvin Fila. Christner's metaxalone, ibuprofen, and methocarbamol. I am also having her maintain her freestyle, glucose  monitoring kit, Precision Glucose Control Soln, PRECISION XTRA TEST STRIPS, sertraline, and ALPRAZolam.  No orders of the defined types were placed in this encounter.    Penni Homans, MD

## 2020-08-26 ENCOUNTER — Other Ambulatory Visit: Payer: Self-pay | Admitting: Family Medicine

## 2020-08-26 ENCOUNTER — Encounter: Payer: Self-pay | Admitting: Family Medicine

## 2020-08-26 MED ORDER — AMOXICILLIN 500 MG PO CAPS: 500.0000 mg | ORAL_CAPSULE | Freq: Three times a day (TID) | ORAL | 0 refills | Status: DC

## 2020-09-15 ENCOUNTER — Other Ambulatory Visit: Payer: Self-pay | Admitting: Family Medicine

## 2020-09-15 DIAGNOSIS — Z79899 Other long term (current) drug therapy: Secondary | ICD-10-CM

## 2020-09-16 NOTE — Telephone Encounter (Signed)
Requesting:Xanax 0.5mg  tab Contract:11/01/19 UDS:11/01/19 Last Visit:08/25/20 Next Visit:11/25/20 Last Refill:05/15/20  Please Advise

## 2020-10-08 ENCOUNTER — Other Ambulatory Visit: Payer: Self-pay | Admitting: Family Medicine

## 2020-10-08 ENCOUNTER — Encounter: Payer: Self-pay | Admitting: Family Medicine

## 2020-10-08 DIAGNOSIS — Z79899 Other long term (current) drug therapy: Secondary | ICD-10-CM

## 2020-10-08 MED ORDER — ALPRAZOLAM 0.5 MG PO TABS: 0.5000 mg | ORAL_TABLET | Freq: Four times a day (QID) | ORAL | 1 refills | Status: DC | PRN

## 2020-10-08 NOTE — Telephone Encounter (Signed)
Requesting: alprazolam 0.5mg  Contract: 02/05/2020 UDS: 11/01/2019 Last Visit: 08/25/2020 Next Visit: 11/25/2020 Last Refill: 09/16/2020 #120 and 1RF  Was sent to wrong pharmacy, can you resend to [facility] on [address]?   Please Advise

## 2020-11-24 ENCOUNTER — Other Ambulatory Visit (INDEPENDENT_AMBULATORY_CARE_PROVIDER_SITE_OTHER): Payer: 59

## 2020-11-24 ENCOUNTER — Other Ambulatory Visit: Payer: Self-pay

## 2020-11-24 ENCOUNTER — Other Ambulatory Visit: Payer: 59

## 2020-11-24 DIAGNOSIS — E669 Obesity, unspecified: Secondary | ICD-10-CM | POA: Diagnosis not present

## 2020-11-24 DIAGNOSIS — E782 Mixed hyperlipidemia: Secondary | ICD-10-CM

## 2020-11-24 DIAGNOSIS — E1169 Type 2 diabetes mellitus with other specified complication: Secondary | ICD-10-CM

## 2020-11-24 DIAGNOSIS — I10 Essential (primary) hypertension: Secondary | ICD-10-CM | POA: Diagnosis not present

## 2020-11-25 ENCOUNTER — Telehealth: Payer: 59 | Admitting: Family Medicine

## 2020-11-25 LAB — COMPREHENSIVE METABOLIC PANEL
ALT: 12 U/L (ref 0–35)
AST: 13 U/L (ref 0–37)
Albumin: 4.2 g/dL (ref 3.5–5.2)
Alkaline Phosphatase: 67 U/L (ref 39–117)
BUN: 17 mg/dL (ref 6–23)
CO2: 26 mEq/L (ref 19–32)
Calcium: 9.3 mg/dL (ref 8.4–10.5)
Chloride: 103 mEq/L (ref 96–112)
Creatinine, Ser: 0.91 mg/dL (ref 0.40–1.20)
GFR: 69.04 mL/min (ref >60.00)
Glucose, Bld: 237 mg/dL — ABNORMAL HIGH (ref 70–99)
Potassium: 4 mEq/L (ref 3.5–5.1)
Sodium: 139 mEq/L (ref 135–145)
Total Bilirubin: 0.3 mg/dL (ref 0.2–1.2)
Total Protein: 6.7 g/dL (ref 6.0–8.3)

## 2020-11-25 LAB — HEMOGLOBIN A1C: Hgb A1c MFr Bld: 7.2 % — ABNORMAL HIGH (ref 4.6–6.5)

## 2020-11-25 LAB — CBC
HCT: 43.8 % (ref 36.0–46.0)
Hemoglobin: 15.1 g/dL — ABNORMAL HIGH (ref 12.0–15.0)
MCHC: 34.5 g/dL (ref 30.0–36.0)
MCV: 91 fl (ref 78.0–100.0)
Platelets: 285 10*3/uL (ref 150.0–400.0)
RBC: 4.81 Mil/uL (ref 3.87–5.11)
RDW: 12.4 % (ref 11.5–15.5)
WBC: 6.8 10*3/uL (ref 4.0–10.5)

## 2020-11-25 LAB — LIPID PANEL
Cholesterol: 225 mg/dL — ABNORMAL HIGH (ref 0–200)
HDL: 62.9 mg/dL (ref >39.00)
NonHDL: 161.63
Total CHOL/HDL Ratio: 4
Triglycerides: 395 mg/dL — ABNORMAL HIGH (ref 0.0–149.0)
VLDL: 79 mg/dL — ABNORMAL HIGH (ref 0.0–40.0)

## 2020-11-25 LAB — LDL CHOLESTEROL, DIRECT: Direct LDL: 142 mg/dL

## 2020-11-25 LAB — TSH: TSH: 1.83 u[IU]/mL (ref 0.35–4.50)

## 2020-11-25 NOTE — Progress Notes (Signed)
Not seen

## 2020-11-26 ENCOUNTER — Encounter: Payer: Self-pay | Admitting: *Deleted

## 2020-11-27 ENCOUNTER — Telehealth: Payer: Self-pay | Admitting: Family Medicine

## 2020-11-27 ENCOUNTER — Other Ambulatory Visit: Payer: 59

## 2020-11-27 DIAGNOSIS — I10 Essential (primary) hypertension: Secondary | ICD-10-CM

## 2020-11-27 DIAGNOSIS — E1169 Type 2 diabetes mellitus with other specified complication: Secondary | ICD-10-CM

## 2020-11-27 DIAGNOSIS — E669 Obesity, unspecified: Secondary | ICD-10-CM

## 2020-11-27 NOTE — Telephone Encounter (Signed)
Called pt to schedule f/u visit with no answer and I didn't see that she should have labs done.

## 2020-11-27 NOTE — Telephone Encounter (Signed)
Patient states she was told to schedule a lab appt and a follow up appt in 3 months. Patient would like to know if she could virtual. (please add lab)

## 2020-11-28 ENCOUNTER — Other Ambulatory Visit: Payer: 59

## 2020-11-28 NOTE — Addendum Note (Signed)
Addended by: Thelma Barge D on: 11/28/2020 08:42 AM   Modules accepted: Orders

## 2020-11-28 NOTE — Telephone Encounter (Signed)
Looks like patient usually gets labs before visit.  She needs lab appt around 02/24/21 and then appointment a week later (that week of 03/02/21)  Left detailed message on machine for patient to call back.  Future orders placed.

## 2020-12-21 ENCOUNTER — Other Ambulatory Visit: Payer: Self-pay | Admitting: Family Medicine

## 2021-01-19 ENCOUNTER — Other Ambulatory Visit: Payer: Self-pay | Admitting: Family Medicine

## 2021-01-19 DIAGNOSIS — Z79899 Other long term (current) drug therapy: Secondary | ICD-10-CM

## 2021-01-20 NOTE — Telephone Encounter (Signed)
Requesting: alprazolam  Contract: 11/01/19 UDS: 11/01/19 Last Visit: 11/25/20 Next Visit: 03/05/21 Last Refill: 10/08/20  Please Advise

## 2021-02-24 ENCOUNTER — Other Ambulatory Visit (INDEPENDENT_AMBULATORY_CARE_PROVIDER_SITE_OTHER): Payer: 59

## 2021-02-24 ENCOUNTER — Other Ambulatory Visit: Payer: Self-pay

## 2021-02-24 DIAGNOSIS — E1169 Type 2 diabetes mellitus with other specified complication: Secondary | ICD-10-CM

## 2021-02-24 DIAGNOSIS — I10 Essential (primary) hypertension: Secondary | ICD-10-CM | POA: Diagnosis not present

## 2021-02-24 DIAGNOSIS — E669 Obesity, unspecified: Secondary | ICD-10-CM

## 2021-02-24 LAB — CBC
HCT: 44.8 % (ref 36.0–46.0)
Hemoglobin: 15 g/dL (ref 12.0–15.0)
MCHC: 33.4 g/dL (ref 30.0–36.0)
MCV: 91.6 fl (ref 78.0–100.0)
Platelets: 255 10*3/uL (ref 150.0–400.0)
RBC: 4.9 Mil/uL (ref 3.87–5.11)
RDW: 13.1 % (ref 11.5–15.5)
WBC: 7.2 10*3/uL (ref 4.0–10.5)

## 2021-02-24 LAB — LIPID PANEL
Cholesterol: 230 mg/dL — ABNORMAL HIGH (ref 0–200)
HDL: 64.3 mg/dL (ref >39.00)
LDL Cholesterol: 133 mg/dL — ABNORMAL HIGH (ref 0–99)
NonHDL: 165.84
Total CHOL/HDL Ratio: 4
Triglycerides: 166 mg/dL — ABNORMAL HIGH (ref 0.0–149.0)
VLDL: 33.2 mg/dL (ref 0.0–40.0)

## 2021-02-24 LAB — COMPREHENSIVE METABOLIC PANEL
ALT: 14 U/L (ref 0–35)
AST: 14 U/L (ref 0–37)
Albumin: 4.1 g/dL (ref 3.5–5.2)
Alkaline Phosphatase: 59 U/L (ref 39–117)
BUN: 15 mg/dL (ref 6–23)
CO2: 28 mEq/L (ref 19–32)
Calcium: 9.6 mg/dL (ref 8.4–10.5)
Chloride: 104 mEq/L (ref 96–112)
Creatinine, Ser: 0.92 mg/dL (ref 0.40–1.20)
GFR: 68.02 mL/min (ref >60.00)
Glucose, Bld: 156 mg/dL — ABNORMAL HIGH (ref 70–99)
Potassium: 4.3 mEq/L (ref 3.5–5.1)
Sodium: 139 mEq/L (ref 135–145)
Total Bilirubin: 0.4 mg/dL (ref 0.2–1.2)
Total Protein: 7 g/dL (ref 6.0–8.3)

## 2021-02-24 LAB — HEMOGLOBIN A1C: Hgb A1c MFr Bld: 7.7 % — ABNORMAL HIGH (ref 4.6–6.5)

## 2021-03-05 ENCOUNTER — Telehealth (INDEPENDENT_AMBULATORY_CARE_PROVIDER_SITE_OTHER): Payer: 59 | Admitting: Family Medicine

## 2021-03-05 ENCOUNTER — Other Ambulatory Visit: Payer: Self-pay

## 2021-03-05 ENCOUNTER — Telehealth: Payer: Self-pay

## 2021-03-05 DIAGNOSIS — I10 Essential (primary) hypertension: Secondary | ICD-10-CM

## 2021-03-05 DIAGNOSIS — E782 Mixed hyperlipidemia: Secondary | ICD-10-CM

## 2021-03-05 DIAGNOSIS — E1169 Type 2 diabetes mellitus with other specified complication: Secondary | ICD-10-CM

## 2021-03-05 DIAGNOSIS — F17200 Nicotine dependence, unspecified, uncomplicated: Secondary | ICD-10-CM | POA: Diagnosis not present

## 2021-03-05 DIAGNOSIS — E669 Obesity, unspecified: Secondary | ICD-10-CM

## 2021-03-05 DIAGNOSIS — F418 Other specified anxiety disorders: Secondary | ICD-10-CM | POA: Diagnosis not present

## 2021-03-05 NOTE — Telephone Encounter (Signed)
Called pt to schedule her for 3 months f/u and lab appointment

## 2021-03-07 NOTE — Assessment & Plan Note (Signed)
She is down to about a 1/4 ppd. She is encouraged to keep trying to quit. Let us know if she needs assistance.

## 2021-03-07 NOTE — Assessment & Plan Note (Signed)
hgba1c acceptable, minimize simple carbs. Increase exercise as tolerated. Continue current meds 

## 2021-03-07 NOTE — Assessment & Plan Note (Signed)
She is tearful and regreetful regarding her relationship with her father in the last couple of years before he past. She finds that she is managing adquately at this time and she does not want to start meds at this time she will let us know if she changes her mind.

## 2021-03-07 NOTE — Assessment & Plan Note (Signed)
She has been monitoring at home and is encouraged to monitor weekly, report any concerns.

## 2021-03-07 NOTE — Assessment & Plan Note (Signed)
Encourage heart healthy diet such as MIND or DASH diet, increase exercise, avoid trans fats, simple carbohydrates and processed foods, consider a krill or fish or flaxseed oil cap daily. She declines statins despite discussing the risk of diabetes and high cholesterol together.

## 2021-03-07 NOTE — Progress Notes (Signed)
MyChart Video Visit    Virtual Visit via Video Note   This visit type was conducted due to national recommendations for restrictions regarding the COVID-19 Pandemic (e.g. social distancing) in an effort to limit this patient's exposure and mitigate transmission in our community. This patient is at least at moderate risk for complications without adequate follow up. This format is felt to be most appropriate for this patient at this time. Physical exam was limited by quality of the video and audio technology used for the visit. S Chism, CMA was able to get the patient set up on a video visit.  Patient location: home Patient and provider in visit Provider location: Office  I discussed the limitations of evaluation and management by telemedicine and the availability of in person appointments. The patient expressed understanding and agreed to proceed.  Visit Date: 03/05/2021  Today's healthcare provider: Penni Homans, MD     Subjective:    Patient ID: Ashley Jackson, female    DOB: Jan 16, 1961, 60 y.o.   MRN: [id number]  No chief complaint on file.   HPI Patient is in today for follow up on chronic medical concerns. No recent febrile illness or hospitalizations. No complaints of polyuria or polydipsia. She is very sad about loosing her job and talks a great deal about how difficult the last years caring for her dad were. She is sad about how she treated him. She acknowledges anxiety and anhedonia but does not acknowledge any suicidal ideation. She notes she has not been eating as well as she was previoiusly. Denies CP/palp/SOB/HA/congestion/fevers/GI or GU c/o. Taking meds as prescribed   Past Medical History:  Diagnosis Date   Allergic state 01/24/2017   Anxiety state, unspecified 07/12/2007   COLONIC POLYPS, HX OF 05/02/2007   DEPRESSION 07/12/2007   DIABETES MELLITUS, TYPE II, UNCONTROLLED 02/25/2010   EXOGENOUS OBESITY 11/07/2007   Hematuria 06/03/2017   HYPERGLYCEMIA 07/12/2007    HYPERLIPIDEMIA 02/07/2008   Low back pain 04/29/2013   Overweight(278.02) 06/24/2010   Palpitations 01/20/2016   Preventative health care 04/29/2013   Tobacco abuse 10/24/2012   TOBACCO USER 06/24/2010    Past Surgical History:  Procedure Laterality Date   COLONOSCOPY  2006    Family History  Problem Relation Age of Onset   Liver cancer Mother    Hypertension Other    Hypertension Father    Stroke Brother    Seizures Son    Diabetes Maternal Uncle    Breast cancer Neg Hx    Prostate cancer Neg Hx    Heart disease Neg Hx     Social History   Socioeconomic History   Marital status: Divorced    Spouse name: Not on file   Number of children: Not on file   Years of education: Not on file   Highest education level: Not on file  Occupational History   Not on file  Tobacco Use   Smoking status: Every Day    Packs/day: 1.00    Types: Cigarettes   Smokeless tobacco: Never  Vaping Use   Vaping Use: Never used  Substance and Sexual Activity   Alcohol use: Not Currently   Drug use: No   Sexual activity: Not Currently  Other Topics Concern   Not on file  Social History Narrative   Not on file   Social Determinants of Health   Financial Resource Strain: Not on file  Food Insecurity: Not on file  Transportation Needs: Not on file  Physical Activity: Not  on file  Stress: Not on file  Social Connections: Not on file  Intimate Partner Violence: Not on file    Outpatient Medications Prior to Visit  Medication Sig Dispense Refill   ALPRAZolam (XANAX) 0.5 MG tablet TAKE 1 TABLET BY MOUTH 4 TIMES A DAY AS NEEDED FOR ANXIETY 120 tablet 1   amoxicillin (AMOXIL) 500 MG capsule Take 1 capsule (500 mg total) by mouth 3 (three) times daily. 30 capsule 0   Blood Glucose Calibration (PRECISION GLUCOSE CONTROL SOLN) SOLN Use as directed (Patient not taking: Reported on 08/25/2020) 1 each 0   glucose blood (PRECISION XTRA TEST STRIPS) test strip Use as directed once a day and as directed.  Dx  code: E11.9 (Patient not taking: Reported on 08/25/2020) 50 each 3   glucose monitoring kit (FREESTYLE) monitoring kit 1 each by Does not apply route as needed for other. (Patient not taking: Reported on 08/25/2020) 1 each 0   Lancets (FREESTYLE) lancets Use as instructed (Patient not taking: Reported on 08/25/2020) 100 each 12   sertraline (ZOLOFT) 100 MG tablet TAKE 1 TABLET BY MOUTH EVERY DAY 90 tablet 1   No facility-administered medications prior to visit.    Allergies  Allergen Reactions   Atarax [Hydroxyzine] Shortness Of Breath    Anxiety, shortness of breath, tachycardia    Cortisone     HEART RACING   Levofloxacin     anxiety   Sulfa Antibiotics     Panic attacks   Sulfonamide Derivatives     REACTION: Panic attacks   Levofloxacin Anxiety    Review of Systems  Constitutional:  Positive for malaise/fatigue. Negative for fever.  HENT:  Negative for congestion.   Eyes:  Negative for blurred vision.  Respiratory:  Negative for shortness of breath.   Cardiovascular:  Negative for chest pain, palpitations and leg swelling.  Gastrointestinal:  Negative for abdominal pain, blood in stool and nausea.  Genitourinary:  Negative for dysuria and frequency.  Musculoskeletal:  Negative for falls.  Skin:  Negative for rash.  Neurological:  Negative for dizziness, loss of consciousness and headaches.  Endo/Heme/Allergies:  Negative for environmental allergies.  Psychiatric/Behavioral:  Positive for suicidal ideas. Negative for depression. The patient is nervous/anxious.       Objective:    Physical Exam Constitutional:      General: She is not in acute distress.    Appearance: Normal appearance. She is not ill-appearing or toxic-appearing.  HENT:     Head: Normocephalic and atraumatic.     Right Ear: External ear normal.     Left Ear: External ear normal.     Nose: Nose normal.  Eyes:     General:        Right eye: No discharge.        Left eye: No discharge.  Pulmonary:      Effort: Pulmonary effort is normal.  Skin:    Findings: No rash.  Neurological:     Mental Status: She is alert and oriented to person, place, and time.  Psychiatric:        Behavior: Behavior normal.    LMP 12/09/2015  Wt Readings from Last 3 Encounters:  08/25/20 171 lb 12.8 oz (77.9 kg)  04/22/20 169 lb 3.2 oz (76.7 kg)  02/05/20 168 lb 9.6 oz (76.5 kg)    Diabetic Foot Exam - Simple   No data filed    Lab Results  Component Value Date   WBC 7.2 02/24/2021   HGB 15.0 02/24/2021  HCT 44.8 02/24/2021   PLT 255.0 02/24/2021   GLUCOSE 156 (H) 02/24/2021   CHOL 230 (H) 02/24/2021   TRIG 166.0 (H) 02/24/2021   HDL 64.30 02/24/2021   LDLDIRECT 142.0 11/24/2020   LDLCALC 133 (H) 02/24/2021   ALT 14 02/24/2021   AST 14 02/24/2021   NA 139 02/24/2021   K 4.3 02/24/2021   CL 104 02/24/2021   CREATININE 0.92 02/24/2021   BUN 15 02/24/2021   CO2 28 02/24/2021   TSH 1.83 11/24/2020   HGBA1C 7.7 (H) 02/24/2021   MICROALBUR 0.72 10/20/2012    Lab Results  Component Value Date   TSH 1.83 11/24/2020   Lab Results  Component Value Date   WBC 7.2 02/24/2021   HGB 15.0 02/24/2021   HCT 44.8 02/24/2021   MCV 91.6 02/24/2021   PLT 255.0 02/24/2021   Lab Results  Component Value Date   NA 139 02/24/2021   K 4.3 02/24/2021   CO2 28 02/24/2021   GLUCOSE 156 (H) 02/24/2021   BUN 15 02/24/2021   CREATININE 0.92 02/24/2021   BILITOT 0.4 02/24/2021   ALKPHOS 59 02/24/2021   AST 14 02/24/2021   ALT 14 02/24/2021   PROT 7.0 02/24/2021   ALBUMIN 4.1 02/24/2021   CALCIUM 9.6 02/24/2021   GFR 68.02 02/24/2021   Lab Results  Component Value Date   CHOL 230 (H) 02/24/2021   Lab Results  Component Value Date   HDL 64.30 02/24/2021   Lab Results  Component Value Date   LDLCALC 133 (H) 02/24/2021   Lab Results  Component Value Date   TRIG 166.0 (H) 02/24/2021   Lab Results  Component Value Date   CHOLHDL 4 02/24/2021   Lab Results  Component Value Date    HGBA1C 7.7 (H) 02/24/2021       Assessment & Plan:   Problem List Items Addressed This Visit     Diabetes mellitus type 2 in obese ([place])    hgba1c acceptable, minimize simple carbs. Increase exercise as tolerated. Continue current meds       Hyperlipidemia, mixed    Encourage heart healthy diet such as MIND or DASH diet, increase exercise, avoid trans fats, simple carbohydrates and processed foods, consider a krill or fish or flaxseed oil cap daily. She declines statins despite discussing the risk of diabetes and high cholesterol together.        TOBACCO USER    She is down to about a 1/4 ppd. She is encouraged to keep trying to quit. Let us know if she needs assistance.        Depression with anxiety    She is tearful and regreetful regarding her relationship with her father in the last couple of years before he past. She finds that she is managing adquately at this time and she does not want to start meds at this time she will let us know if she changes her mind.        High blood pressure    She has been monitoring at home and is encouraged to monitor weekly, report any concerns.         I am having Ashley Jackson maintain her freestyle, glucose monitoring kit, Precision Glucose Control Soln, PRECISION XTRA TEST STRIPS, amoxicillin, sertraline, and ALPRAZolam.  No orders of the defined types were placed in this encounter.   I discussed the assessment and treatment plan with the patient. The patient was provided an opportunity to ask questions and all were answered.  The patient agreed with the plan and demonstrated an understanding of the instructions.   The patient was advised to call back or seek an in-person evaluation if the symptoms worsen or if the condition fails to improve as anticipated.  I provided 22 minutes of face-to-face time during this encounter.   Penni Homans, MD [facility] [phone]  (phone) [phone] (fax)  [place]

## 2021-03-18 ENCOUNTER — Other Ambulatory Visit: Payer: Self-pay | Admitting: Family Medicine

## 2021-03-18 DIAGNOSIS — Z79899 Other long term (current) drug therapy: Secondary | ICD-10-CM

## 2021-03-19 NOTE — Telephone Encounter (Signed)
Requesting: alprazolam 0.5mg   Contract: 02/05/2020 UDS: 11/01/2019 Last Visit: 03/05/2021 Next Visit: 06/09/2021 Last Refill: 01/21/2021 #120 and 0RF  Please Advise

## 2021-03-31 ENCOUNTER — Telehealth: Payer: Self-pay | Admitting: *Deleted

## 2021-03-31 ENCOUNTER — Encounter: Payer: Self-pay | Admitting: Family Medicine

## 2021-03-31 NOTE — Telephone Encounter (Signed)
Lvm to call back

## 2021-03-31 NOTE — Telephone Encounter (Signed)
Error

## 2021-03-31 NOTE — Telephone Encounter (Signed)
Patient calling and wanting to make an appt with [facility] and is very addiment on it  She states she would like a call back from a nurse that works with [facility] and can help her out She does not want to see another provider  Please advise  Call back: [phone]

## 2021-03-31 NOTE — Telephone Encounter (Signed)
Pt has called back and stated that she wanted to talk to Dr. Elby Showers assistant. I tried to help and informed her that Dr. Abner Greenspan appt is far out since that is the only person she wants to see.

## 2021-04-01 NOTE — Telephone Encounter (Signed)
Patient scheduled for Monday 04/06/21

## 2021-04-06 ENCOUNTER — Ambulatory Visit (INDEPENDENT_AMBULATORY_CARE_PROVIDER_SITE_OTHER): Payer: 59 | Admitting: Family Medicine

## 2021-04-06 ENCOUNTER — Other Ambulatory Visit: Payer: Self-pay

## 2021-04-06 DIAGNOSIS — R519 Headache, unspecified: Secondary | ICD-10-CM

## 2021-04-06 DIAGNOSIS — F17200 Nicotine dependence, unspecified, uncomplicated: Secondary | ICD-10-CM

## 2021-04-06 DIAGNOSIS — F418 Other specified anxiety disorders: Secondary | ICD-10-CM

## 2021-04-06 DIAGNOSIS — E1169 Type 2 diabetes mellitus with other specified complication: Secondary | ICD-10-CM

## 2021-04-06 DIAGNOSIS — E669 Obesity, unspecified: Secondary | ICD-10-CM | POA: Diagnosis not present

## 2021-04-06 LAB — COMPREHENSIVE METABOLIC PANEL
ALT: 13 U/L (ref 0–35)
AST: 14 U/L (ref 0–37)
Albumin: 4.2 g/dL (ref 3.5–5.2)
Alkaline Phosphatase: 56 U/L (ref 39–117)
BUN: 14 mg/dL (ref 6–23)
CO2: 28 mEq/L (ref 19–32)
Calcium: 9.6 mg/dL (ref 8.4–10.5)
Chloride: 103 mEq/L (ref 96–112)
Creatinine, Ser: 0.83 mg/dL (ref 0.40–1.20)
GFR: 76.91 mL/min (ref >60.00)
Glucose, Bld: 177 mg/dL — ABNORMAL HIGH (ref 70–99)
Potassium: 4.3 mEq/L (ref 3.5–5.1)
Sodium: 140 mEq/L (ref 135–145)
Total Bilirubin: 0.4 mg/dL (ref 0.2–1.2)
Total Protein: 7.1 g/dL (ref 6.0–8.3)

## 2021-04-06 LAB — CBC
HCT: 44.4 % (ref 36.0–46.0)
Hemoglobin: 14.7 g/dL (ref 12.0–15.0)
MCHC: 33.2 g/dL (ref 30.0–36.0)
MCV: 92.5 fl (ref 78.0–100.0)
Platelets: 263 10*3/uL (ref 150.0–400.0)
RBC: 4.8 Mil/uL (ref 3.87–5.11)
RDW: 12.9 % (ref 11.5–15.5)
WBC: 7.3 10*3/uL (ref 4.0–10.5)

## 2021-04-06 LAB — TSH: TSH: 2.21 u[IU]/mL (ref 0.35–5.50)

## 2021-04-06 LAB — SEDIMENTATION RATE: Sed Rate: 12 mm/hr (ref 0–30)

## 2021-04-06 NOTE — Patient Instructions (Signed)
Temporal arteritis   General Headache Without Cause A headache is pain or discomfort felt around the head or neck area. The specific cause of a headache may not be found. There are many causes and types of headaches. A few common ones are: Tension headaches. Migraine headaches. Cluster headaches. Chronic daily headaches. Follow these instructions at home: Watch your condition for any changes. Let your health care provider know aboutthem. Take these steps to help with your condition: Managing pain     Take over-the-counter and prescription medicines only as told by your health care provider. Lie down in a dark, quiet room when you have a headache. If directed, put ice on your head and neck area: Put ice in a plastic bag. Place a towel between your skin and the bag. Leave the ice on for 20 minutes, 2-3 times per day. If directed, apply heat to the affected area. Use the heat source that your health care provider recommends, such as a moist heat pack or a heating pad. Place a towel between your skin and the heat source. Leave the heat on for 20-30 minutes. Remove the heat if your skin turns bright red. This is especially important if you are unable to feel pain, heat, or cold. You may have a greater risk of getting burned. Keep lights dim if bright lights bother you or make your headaches worse. Eating and drinking Eat meals on a regular schedule. If you drink alcohol: Limit how much you use to: 0-1 drink a day for women. 0-2 drinks a day for men. Be aware of how much alcohol is in your drink. In the [place] one drink equals one 12 oz bottle of beer (355 mL), one 5 oz glass of wine (148 mL), or one 1 oz glass of hard liquor (44 mL). Stop drinking caffeine, or decrease the amount of caffeine you drink. General instructions  Keep a headache journal to help find out what may trigger your headaches. For example, write down: What you eat and drink. How much sleep you get. Any change to  your diet or medicines. Try massage or other relaxation techniques. Limit stress. Sit up straight, and do not tense your muscles. Do not use any products that contain nicotine or tobacco, such as cigarettes, e-cigarettes, and chewing tobacco. If you need help quitting, ask your health care provider. Exercise regularly as told by your health care provider. Sleep on a regular schedule. Get 7-9 hours of sleep each night, or the amount recommended by your health care provider. Keep all follow-up visits as told by your health care provider. This is important.  Contact a health care provider if: Your symptoms are not helped by medicine. You have a headache that is different from the usual headache. You have nausea or you vomit. You have a fever. Get help right away if: Your headache becomes severe quickly. Your headache gets worse after moderate to intense physical activity. You have repeated vomiting. You have a stiff neck. You have a loss of vision. You have problems with speech. You have pain in the eye or ear. You have muscular weakness or loss of muscle control. You lose your balance or have trouble walking. You feel faint or pass out. You have confusion. You have a seizure. Summary A headache is pain or discomfort felt around the head or neck area. There are many causes and types of headaches. In some cases, the cause may not be found. Keep a headache journal to help find out what may  trigger your headaches. Watch your condition for any changes. Let your health care provider know about them. Contact a health care provider if you have a headache that is different from the usual headache, or if your symptoms are not helped by medicine. Get help right away if your headache becomes severe, you vomit, you have a loss of vision, you lose your balance, or you have a seizure. This information is not intended to replace advice given to you by your health care provider. Make sure you discuss  any questions you have with your healthcare provider. Document Revised: 02/27/2018 Document Reviewed: 02/27/2018 Elsevier Patient Education  2022 [employer].

## 2021-04-06 NOTE — Progress Notes (Signed)
Subjective:   By signing my name below, I, Zite Okoli, attest that this documentation has been prepared under the direction and in the presence of Mosie Lukes, MD 04/06/2021    Patient ID: Ashley Jackson, female    DOB: 1961-06-15, 60 y.o.   MRN: [id number]  Chief Complaint  Patient presents with   Headache    HPI Patient is in today for an office visit.  She reports having consistent head pain that started when she was taking care of her dad, it stopped and when she started studying for an exam, the pain started again. It occurs often through out the day for 15-20 minutes periodically. She describes it as throbbing and mentions the pain radiates to all parts of her head and her neck. She also mentions she has been very stressed lately. The pain does not occur everyday and she does not wake up with the headaches.  She reports having fasciculations in her legs. She also reports she has felt her neck "cracking".  She denies nausea, fever, abdominal pain and sensitivity to light.   She has an upcoming eye appointment in October to discuss the eye strain because she was staring at the computer for too long while studying.  Past Medical History:  Diagnosis Date   Allergic state 01/24/2017   Anxiety state, unspecified 07/12/2007   COLONIC POLYPS, HX OF 05/02/2007   DEPRESSION 07/12/2007   DIABETES MELLITUS, TYPE II, UNCONTROLLED 02/25/2010   EXOGENOUS OBESITY 11/07/2007   Hematuria 06/03/2017   HYPERGLYCEMIA 07/12/2007   HYPERLIPIDEMIA 02/07/2008   Low back pain 04/29/2013   Overweight(278.02) 06/24/2010   Palpitations 01/20/2016   Preventative health care 04/29/2013   Tobacco abuse 10/24/2012   TOBACCO USER 06/24/2010    Past Surgical History:  Procedure Laterality Date   COLONOSCOPY  2006    Family History  Problem Relation Age of Onset   Liver cancer Mother    Hypertension Other    Hypertension Father    Stroke Brother    Seizures Son    Diabetes Maternal Uncle    Breast cancer  Neg Hx    Prostate cancer Neg Hx    Heart disease Neg Hx     Social History   Socioeconomic History   Marital status: Divorced    Spouse name: Not on file   Number of children: Not on file   Years of education: Not on file   Highest education level: Not on file  Occupational History   Not on file  Tobacco Use   Smoking status: Every Day    Packs/day: 1.00    Types: Cigarettes   Smokeless tobacco: Never  Vaping Use   Vaping Use: Never used  Substance and Sexual Activity   Alcohol use: Not Currently   Drug use: No   Sexual activity: Not Currently  Other Topics Concern   Not on file  Social History Narrative   Not on file   Social Determinants of Health   Financial Resource Strain: Not on file  Food Insecurity: Not on file  Transportation Needs: Not on file  Physical Activity: Not on file  Stress: Not on file  Social Connections: Not on file  Intimate Partner Violence: Not on file    Outpatient Medications Prior to Visit  Medication Sig Dispense Refill   ALPRAZolam (XANAX) 0.5 MG tablet TAKE 1 TABLET BY MOUTH 4 TIMES A DAY AS NEEDED FOR ANXIETY 120 tablet 1   sertraline (ZOLOFT) 100 MG tablet TAKE 1  TABLET BY MOUTH EVERY DAY 90 tablet 1   amoxicillin (AMOXIL) 500 MG capsule Take 1 capsule (500 mg total) by mouth 3 (three) times daily. 30 capsule 0   Blood Glucose Calibration (PRECISION GLUCOSE CONTROL SOLN) SOLN Use as directed (Patient not taking: Reported on 04/06/2021) 1 each 0   glucose blood (PRECISION XTRA TEST STRIPS) test strip Use as directed once a day and as directed.  Dx code: E11.9 (Patient not taking: Reported on 04/06/2021) 50 each 3   glucose monitoring kit (FREESTYLE) monitoring kit 1 each by Does not apply route as needed for other. (Patient not taking: Reported on 04/06/2021) 1 each 0   Lancets (FREESTYLE) lancets Use as instructed (Patient not taking: Reported on 04/06/2021) 100 each 12   No facility-administered medications prior to visit.     Allergies  Allergen Reactions   Atarax [Hydroxyzine] Shortness Of Breath    Anxiety, shortness of breath, tachycardia    Cortisone     HEART RACING   Levofloxacin     anxiety   Sulfa Antibiotics     Panic attacks   Sulfonamide Derivatives     REACTION: Panic attacks   Levofloxacin Anxiety    Review of Systems  Constitutional:  Negative for fever and malaise/fatigue.  HENT:  Negative for congestion.   Eyes:  Negative for redness.  Respiratory:  Negative for shortness of breath.   Cardiovascular:  Negative for chest pain, palpitations and leg swelling.  Gastrointestinal:  Negative for abdominal pain, blood in stool and nausea.  Genitourinary:  Negative for dysuria and frequency.  Musculoskeletal:  Negative for falls.  Skin:  Negative for rash.  Neurological:  Positive for headaches. Negative for dizziness and loss of consciousness.  Endo/Heme/Allergies:  Negative for polydipsia.  Psychiatric/Behavioral:  Negative for depression. The patient is not nervous/anxious.       Objective:    Physical Exam Constitutional:      General: She is not in acute distress.    Appearance: She is well-developed.  HENT:     Head: Normocephalic and atraumatic.     Right Ear: Tympanic membrane, ear canal and external ear normal.     Left Ear: Tympanic membrane, ear canal and external ear normal.  Eyes:     Extraocular Movements: Extraocular movements intact.     Conjunctiva/sclera: Conjunctivae normal.     Pupils: Pupils are equal, round, and reactive to light.     Comments: No nystagmus  Neck:     Thyroid: No thyromegaly.  Cardiovascular:     Rate and Rhythm: Normal rate and regular rhythm.     Heart sounds: Normal heart sounds. No murmur heard. Pulmonary:     Effort: Pulmonary effort is normal. No respiratory distress.     Breath sounds: Normal breath sounds.  Abdominal:     General: Bowel sounds are normal. There is no distension.     Palpations: Abdomen is soft. There is no  mass.     Tenderness: There is no abdominal tenderness.  Musculoskeletal:     Cervical back: Neck supple.     Comments: 5/5 strength in upper and lower extremities  Lymphadenopathy:     Cervical: No cervical adenopathy.  Skin:    General: Skin is warm and dry.  Neurological:     Mental Status: She is alert and oriented to person, place, and time.     Deep Tendon Reflexes:     Reflex Scores:      Patellar reflexes are 2+ on the  right side and 2+ on the left side. Psychiatric:        Behavior: Behavior normal.    LMP 12/09/2015  Wt Readings from Last 3 Encounters:  08/25/20 171 lb 12.8 oz (77.9 kg)  04/22/20 169 lb 3.2 oz (76.7 kg)  02/05/20 168 lb 9.6 oz (76.5 kg)    Diabetic Foot Exam - Simple   No data filed    Lab Results  Component Value Date   WBC 7.3 04/06/2021   HGB 14.7 04/06/2021   HCT 44.4 04/06/2021   PLT 263.0 04/06/2021   GLUCOSE 177 (H) 04/06/2021   CHOL 230 (H) 02/24/2021   TRIG 166.0 (H) 02/24/2021   HDL 64.30 02/24/2021   LDLDIRECT 142.0 11/24/2020   LDLCALC 133 (H) 02/24/2021   ALT 13 04/06/2021   AST 14 04/06/2021   NA 140 04/06/2021   K 4.3 04/06/2021   CL 103 04/06/2021   CREATININE 0.83 04/06/2021   BUN 14 04/06/2021   CO2 28 04/06/2021   TSH 2.21 04/06/2021   HGBA1C 7.7 (H) 02/24/2021   MICROALBUR 0.72 10/20/2012    Lab Results  Component Value Date   TSH 2.21 04/06/2021   Lab Results  Component Value Date   WBC 7.3 04/06/2021   HGB 14.7 04/06/2021   HCT 44.4 04/06/2021   MCV 92.5 04/06/2021   PLT 263.0 04/06/2021   Lab Results  Component Value Date   NA 140 04/06/2021   K 4.3 04/06/2021   CO2 28 04/06/2021   GLUCOSE 177 (H) 04/06/2021   BUN 14 04/06/2021   CREATININE 0.83 04/06/2021   BILITOT 0.4 04/06/2021   ALKPHOS 56 04/06/2021   AST 14 04/06/2021   ALT 13 04/06/2021   PROT 7.1 04/06/2021   ALBUMIN 4.2 04/06/2021   CALCIUM 9.6 04/06/2021   GFR 76.91 04/06/2021   Lab Results  Component Value Date   CHOL 230  (H) 02/24/2021   Lab Results  Component Value Date   HDL 64.30 02/24/2021   Lab Results  Component Value Date   LDLCALC 133 (H) 02/24/2021   Lab Results  Component Value Date   TRIG 166.0 (H) 02/24/2021   Lab Results  Component Value Date   CHOLHDL 4 02/24/2021   Lab Results  Component Value Date   HGBA1C 7.7 (H) 02/24/2021       Assessment & Plan:   Problem List Items Addressed This Visit     Diabetes mellitus type 2 in obese ([place])    hgba1c acceptable, minimize simple carbs. Increase exercise as tolerated. Continue current meds      Relevant Orders   Comprehensive metabolic panel (Completed)   TOBACCO USER    Needs complete cessation. Discussed need to quit as relates to risk of numerous cancers, cardiac and pulmonary disease as well as neurologic complications.       Depression with anxiety    She is under a great deal of stress and she does believe that her headaches are affected by this but she does not want to alter medications.       Headache    No photophobia or phonophobia, no nausea. Has headache roughly 1/2 days of the comes and goes, is all over the head and throbbing. 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 She is noting eye strain with the computer and has an eye exam soon. There are no associated neurologic symptoms. Encouraged increased hydration, 64 ounces of clear fluids daily. Minimize alcohol and caffeine. Eat  small frequent meals with lean proteins and complex carbs. Avoid high and low blood sugars. Get adequate sleep, 7-8 hours a night. Needs exercise daily preferably in the morning. She will notify us if symptoms worsen or change. Will check labs including Sed rate, tsh, cbc and cmp for reassurance. Spent 25 minutes with patient offering reassurance and formulated a plan of care.      Relevant Orders   TSH (Completed)   Sedimentation rate (Completed)   Comprehensive metabolic panel (Completed)   CBC (Completed)     No orders of the defined types were placed in this encounter.   I,Zite Okoli,acting as a Education administrator for Penni Homans, MD.,have documented all relevant documentation on the behalf of Penni Homans, MD,as directed by  Penni Homans, MD while in the presence of Penni Homans, MD.   Claude Manges, MD , personally preformed the services described in this documentation.  All medical record entries made by the scribe were at my direction and in my presence.  I have reviewed the chart and discharge instructions (if applicable) and agree that the record reflects my personal performance and is accurate and complete. 04/06/2021

## 2021-04-06 NOTE — Assessment & Plan Note (Signed)
hgba1c acceptable, minimize simple carbs. Increase exercise as tolerated. Continue current meds 

## 2021-04-06 NOTE — Assessment & Plan Note (Addendum)
No photophobia or phonophobia, no nausea. Has headache roughly 1/2 days of the comes and goes, is all over the head and throbbing. 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 She is noting eye strain with the computer and has an eye exam soon. There are no associated neurologic symptoms. Encouraged increased hydration, 64 ounces of clear fluids daily. Minimize alcohol and caffeine. Eat small frequent meals with lean proteins and complex carbs. Avoid high and low blood sugars. Get adequate sleep, 7-8 hours a night. Needs exercise daily preferably in the morning. She will notify us if symptoms worsen or change. Will check labs including Sed rate, tsh, cbc and cmp for reassurance. Spent 25 minutes with patient offering reassurance and formulated a plan of care.

## 2021-04-12 NOTE — Assessment & Plan Note (Signed)
Needs complete cessation. Discussed need to quit as relates to risk of numerous cancers, cardiac and pulmonary disease as well as neurologic complications.

## 2021-04-12 NOTE — Assessment & Plan Note (Signed)
She is under a great deal of stress and she does believe that her headaches are affected by this but she does not want to alter medications.

## 2021-05-11 ENCOUNTER — Other Ambulatory Visit: Payer: Self-pay | Admitting: Family Medicine

## 2021-05-11 ENCOUNTER — Encounter: Payer: Self-pay | Admitting: Family Medicine

## 2021-05-11 MED ORDER — VIRTUSSIN A/C 100-10 MG/5ML PO SOLN: 5.0000 mL | Freq: Three times a day (TID) | ORAL | 0 refills | Status: DC | PRN

## 2021-05-14 ENCOUNTER — Encounter: Payer: Self-pay | Admitting: Family Medicine

## 2021-05-23 ENCOUNTER — Other Ambulatory Visit: Payer: Self-pay | Admitting: Family Medicine

## 2021-05-23 DIAGNOSIS — Z79899 Other long term (current) drug therapy: Secondary | ICD-10-CM

## 2021-05-25 NOTE — Telephone Encounter (Signed)
Requesting: alprazolam Contract: 11/01/19 UDS: Last Visit: 04/06/21 Next Visit: 06/09/21 Last Refill: 03/19/21  Please Advise

## 2021-05-27 ENCOUNTER — Other Ambulatory Visit (INDEPENDENT_AMBULATORY_CARE_PROVIDER_SITE_OTHER): Payer: 59

## 2021-05-27 ENCOUNTER — Other Ambulatory Visit: Payer: Self-pay

## 2021-05-27 DIAGNOSIS — E782 Mixed hyperlipidemia: Secondary | ICD-10-CM

## 2021-05-27 DIAGNOSIS — I10 Essential (primary) hypertension: Secondary | ICD-10-CM

## 2021-05-27 DIAGNOSIS — E669 Obesity, unspecified: Secondary | ICD-10-CM

## 2021-05-27 DIAGNOSIS — E1169 Type 2 diabetes mellitus with other specified complication: Secondary | ICD-10-CM

## 2021-05-27 LAB — LDL CHOLESTEROL, DIRECT: Direct LDL: 131 mg/dL

## 2021-05-27 LAB — LIPID PANEL
Cholesterol: 221 mg/dL — ABNORMAL HIGH (ref 0–200)
HDL: 65.5 mg/dL (ref >39.00)
NonHDL: 155.46
Total CHOL/HDL Ratio: 3
Triglycerides: 240 mg/dL — ABNORMAL HIGH (ref 0.0–149.0)
VLDL: 48 mg/dL — ABNORMAL HIGH (ref 0.0–40.0)

## 2021-05-27 LAB — COMPREHENSIVE METABOLIC PANEL
ALT: 17 U/L (ref 0–35)
AST: 17 U/L (ref 0–37)
Albumin: 4.1 g/dL (ref 3.5–5.2)
Alkaline Phosphatase: 52 U/L (ref 39–117)
BUN: 15 mg/dL (ref 6–23)
CO2: 29 mEq/L (ref 19–32)
Calcium: 9.4 mg/dL (ref 8.4–10.5)
Chloride: 103 mEq/L (ref 96–112)
Creatinine, Ser: 0.83 mg/dL (ref 0.40–1.20)
GFR: 76.83 mL/min (ref >60.00)
Glucose, Bld: 180 mg/dL — ABNORMAL HIGH (ref 70–99)
Potassium: 4.2 mEq/L (ref 3.5–5.1)
Sodium: 139 mEq/L (ref 135–145)
Total Bilirubin: 0.5 mg/dL (ref 0.2–1.2)
Total Protein: 7 g/dL (ref 6.0–8.3)

## 2021-05-27 LAB — CBC WITH DIFFERENTIAL/PLATELET
Basophils Absolute: 0 10*3/uL (ref 0.0–0.1)
Basophils Relative: 0.3 % (ref 0.0–3.0)
Eosinophils Absolute: 0.1 10*3/uL (ref 0.0–0.7)
Eosinophils Relative: 0.9 % (ref 0.0–5.0)
HCT: 43.5 % (ref 36.0–46.0)
Hemoglobin: 14.4 g/dL (ref 12.0–15.0)
Lymphocytes Relative: 34.6 % (ref 12.0–46.0)
Lymphs Abs: 2.4 10*3/uL (ref 0.7–4.0)
MCHC: 33.2 g/dL (ref 30.0–36.0)
MCV: 92.6 fl (ref 78.0–100.0)
Monocytes Absolute: 0.5 10*3/uL (ref 0.1–1.0)
Monocytes Relative: 7.8 % (ref 3.0–12.0)
Neutro Abs: 3.9 10*3/uL (ref 1.4–7.7)
Neutrophils Relative %: 56.4 % (ref 43.0–77.0)
Platelets: 273 10*3/uL (ref 150.0–400.0)
RBC: 4.7 Mil/uL (ref 3.87–5.11)
RDW: 12.8 % (ref 11.5–15.5)
WBC: 6.9 10*3/uL (ref 4.0–10.5)

## 2021-05-27 LAB — HEMOGLOBIN A1C: Hgb A1c MFr Bld: 8.3 % — ABNORMAL HIGH (ref 4.6–6.5)

## 2021-06-09 ENCOUNTER — Other Ambulatory Visit: Payer: Self-pay

## 2021-06-09 ENCOUNTER — Ambulatory Visit (INDEPENDENT_AMBULATORY_CARE_PROVIDER_SITE_OTHER): Payer: 59 | Admitting: Family Medicine

## 2021-06-09 ENCOUNTER — Encounter: Payer: Self-pay | Admitting: Family Medicine

## 2021-06-09 VITALS — BP 122/78 | HR 98 | Temp 98.0°F | Resp 16 | Ht 63.0 in | Wt 177.4 lb

## 2021-06-09 DIAGNOSIS — E669 Obesity, unspecified: Secondary | ICD-10-CM

## 2021-06-09 DIAGNOSIS — Z79899 Other long term (current) drug therapy: Secondary | ICD-10-CM

## 2021-06-09 DIAGNOSIS — F418 Other specified anxiety disorders: Secondary | ICD-10-CM

## 2021-06-09 DIAGNOSIS — F17200 Nicotine dependence, unspecified, uncomplicated: Secondary | ICD-10-CM

## 2021-06-09 DIAGNOSIS — E782 Mixed hyperlipidemia: Secondary | ICD-10-CM

## 2021-06-09 DIAGNOSIS — E1169 Type 2 diabetes mellitus with other specified complication: Secondary | ICD-10-CM

## 2021-06-09 DIAGNOSIS — R002 Palpitations: Secondary | ICD-10-CM

## 2021-06-09 DIAGNOSIS — Z0000 Encounter for general adult medical examination without abnormal findings: Secondary | ICD-10-CM

## 2021-06-09 MED ORDER — SERTRALINE HCL 100 MG PO TABS: 100.0000 mg | ORAL_TABLET | Freq: Every day | ORAL | 1 refills | Status: DC

## 2021-06-09 NOTE — Patient Instructions (Addendum)
New bivalent covid shot available with walk-ins downstairs at [facility] Mon-Fri, 9am-3pm or any other pharmacy.  Molnupiravir/Paxlovid is the new COVID medication we can give you if you get COVID so make sure you test if you have symptoms because we have to treat by day 5 of symptoms for it to be effective. If you are positive let us know so we can treat. If a home test is negative and your symptoms are persistent get a PCR test. Can check testing locations at [email or web address] If you are positive we will make an appointment with [place] and we will send in molnupiravir/paxlovid if you would like it. Check with your pharmacy before we meet to confirm they have it in stock, if they do not then we can get the prescription at the [facility].

## 2021-06-09 NOTE — Progress Notes (Signed)
Patient ID: Ashley Jackson, female    DOB: 1961-04-22  Age: 60 y.o. MRN: [id number]    Subjective:   Chief Complaint  Patient presents with   3 months follow up   Subjective   HPI Ashley Jackson presents for office visit today for follow up on htn and type 2 diabetes. Currently she does not want to start on diabetes medication and does not want the flu or covid shot. At the moment she is taking Sertaline 100 mg daily and Alprazolam 0.5 mg prn. Denies CP/palp/SOB/HA/congestion/fevers/GI or GU c/o. At the moment she is still smoking.   Review of Systems  Constitutional:  Negative for chills, fatigue and fever.  HENT:  Negative for congestion, rhinorrhea, sinus pressure, sinus pain and sore throat.   Eyes:  Negative for pain.  Respiratory:  Negative for cough and shortness of breath.   Cardiovascular:  Negative for chest pain, palpitations and leg swelling.  Gastrointestinal:  Negative for abdominal pain, blood in stool, diarrhea, nausea and vomiting.  Genitourinary:  Negative for decreased urine volume, flank pain, frequency, vaginal bleeding and vaginal discharge.  Musculoskeletal:  Negative for back pain.  Neurological:  Negative for headaches.   History Past Medical History:  Diagnosis Date   Allergic state 01/24/2017   Anxiety state, unspecified 07/12/2007   COLONIC POLYPS, HX OF 05/02/2007   DEPRESSION 07/12/2007   DIABETES MELLITUS, TYPE II, UNCONTROLLED 02/25/2010   EXOGENOUS OBESITY 11/07/2007   Hematuria 06/03/2017   HYPERGLYCEMIA 07/12/2007   HYPERLIPIDEMIA 02/07/2008   Low back pain 04/29/2013   Overweight(278.02) 06/24/2010   Palpitations 01/20/2016   Preventative health care 04/29/2013   Tobacco abuse 10/24/2012   TOBACCO USER 06/24/2010    She has a past surgical history that includes Colonoscopy (2006).   Her family history includes Diabetes in her maternal uncle; Hypertension in her father and another family member; Liver cancer in her mother; Seizures in her son; Stroke in  her brother.She reports that she has been smoking cigarettes. She has been smoking an average of 1 pack per day. She has never used smokeless tobacco. She reports that she does not currently use alcohol. She reports that she does not use drugs.  Current Outpatient Medications on File Prior to Visit  Medication Sig Dispense Refill   ALPRAZolam (XANAX) 0.5 MG tablet TAKE 1 TABLET BY MOUTH 4 TIMES A DAY AS NEEDED FOR ANXIETY 120 tablet 1   No current facility-administered medications on file prior to visit.     Objective:  Objective  Physical Exam Constitutional:      General: She is not in acute distress.    Appearance: Normal appearance. She is not ill-appearing or toxic-appearing.  HENT:     Head: Normocephalic and atraumatic.     Right Ear: Tympanic membrane, ear canal and external ear normal.     Left Ear: Tympanic membrane, ear canal and external ear normal.     Nose: No congestion or rhinorrhea.  Eyes:     Extraocular Movements: Extraocular movements intact.     Pupils: Pupils are equal, round, and reactive to light.  Cardiovascular:     Rate and Rhythm: Normal rate and regular rhythm.     Pulses: Normal pulses.     Heart sounds: Normal heart sounds. No murmur heard. Pulmonary:     Effort: Pulmonary effort is normal. No respiratory distress.     Breath sounds: Normal breath sounds. No wheezing, rhonchi or rales.  Abdominal:     General: Bowel sounds  are normal.     Palpations: Abdomen is soft. There is no mass.     Tenderness: There is no abdominal tenderness. There is no guarding.     Hernia: No hernia is present.  Musculoskeletal:        General: Normal range of motion.     Cervical back: Normal range of motion and neck supple.  Skin:    General: Skin is warm and dry.  Neurological:     Mental Status: She is alert and oriented to person, place, and time.  Psychiatric:        Behavior: Behavior normal.   BP 122/78   Pulse 98   Temp 98 F (36.7 C)   Resp 16   Ht  5\' 3"  (1.6 m)   Wt 177 lb 6.4 oz (80.5 kg)   LMP 12/09/2015   SpO2 98%   BMI 31.42 kg/m  Wt Readings from Last 3 Encounters:  06/09/21 177 lb 6.4 oz (80.5 kg)  08/25/20 171 lb 12.8 oz (77.9 kg)  04/22/20 169 lb 3.2 oz (76.7 kg)     Lab Results  Component Value Date   WBC 6.9 05/27/2021   HGB 14.4 05/27/2021   HCT 43.5 05/27/2021   PLT 273.0 05/27/2021   GLUCOSE 180 (H) 05/27/2021   CHOL 221 (H) 05/27/2021   TRIG 240.0 (H) 05/27/2021   HDL 65.50 05/27/2021   LDLDIRECT 131.0 05/27/2021   LDLCALC 133 (H) 02/24/2021   ALT 17 05/27/2021   AST 17 05/27/2021   NA 139 05/27/2021   K 4.2 05/27/2021   CL 103 05/27/2021   CREATININE 0.83 05/27/2021   BUN 15 05/27/2021   CO2 29 05/27/2021   TSH 2.21 04/06/2021   HGBA1C 8.3 (H) 05/27/2021   MICROALBUR 0.72 10/20/2012    10/22/2012 Renal  Result Date: 06/06/2017 CLINICAL DATA:  60 year old diabetic female with hematuria. Initial encounter. EXAM: RENAL / URINARY TRACT ULTRASOUND COMPLETE COMPARISON:  None. FINDINGS: Right Kidney: Length: 10.8 cm. Echogenicity within normal limits. No mass or hydronephrosis visualized. Left Kidney: Length: 10.6 cm. Mild fullness left renal pelvis. No mass identified. Bladder: Bilateral ureteral jets noted.  No bladder mass identified. IMPRESSION: Mild fullness left renal pelvis. Otherwise negative renal sonogram. Electronically Signed   By: 59 M.D.   On: 06/06/2017 13:03     Assessment & Plan:  Plan    Meds ordered this encounter  Medications   sertraline (ZOLOFT) 100 MG tablet    Sig: Take 1 tablet (100 mg total) by mouth daily.    Dispense:  90 tablet    Refill:  1     Problem List Items Addressed This Visit     Diabetes mellitus type 2 in obese (HCC)    hgba1c unacceptable, minimize simple carbs. Increase exercise as tolerated. She continues to decline medications and reports she will diminished her carbohydrates and processed foods and increase her exercise.       Relevant Orders    Hemoglobin A1c   Comprehensive metabolic panel   TSH   Hyperlipidemia, mixed    Encourage heart healthy diet such as MIND or DASH diet, increase exercise, avoid trans fats, simple carbohydrates and processed foods, consider a krill or fish or flaxseed oil cap daily. She is strongly advised that as a diabetic she 100% should be taking a statin to control her cholesterol but she refuses. She understands that this significantly increases her risk of a bad event such as Stroke or Heart Attack but still declines  Relevant Orders   Lipid panel   TSH   TOBACCO USER    Sadly she continues to smoke but she does report she is trying to get ready to quit and hopes to return with good news. She declines medicaitons at this time.       Depression with anxiety    She continues to be stressed but does feel the Alprazolam and Sertraline are helpful. She has just gotten a new job and is very relieved by this. She will be working from home for [employer].       Relevant Medications   sertraline (ZOLOFT) 100 MG tablet   Preventative health care    Patient continues to decline all vaccinations, MGM, colonoscopy       Palpitations   Relevant Orders   CBC   TSH   Other Visit Diagnoses     High risk medication use    -  Primary   Relevant Orders   Drug Monitoring Panel [phone] , Urine       Follow-up: Return in about 3 months (around 09/09/2021) for lab appt, then f/u visit a couple of days after.  I, Billie Lade, acting as a scribe for Danise Edge, MD, have documented all relevent documentation on behalf of Danise Edge, MD, as directed by Danise Edge, MD while in the presence of Danise Edge, MD. DO:06/10/21.  I, Bradd Canary, MD personally performed the services described in this documentation. All medical record entries made by the scribe were at my direction and in my presence. I have reviewed the chart and agree that the record reflects my personal performance and is accurate and  complete

## 2021-06-10 NOTE — Assessment & Plan Note (Signed)
Patient continues to decline all vaccinations, MGM, colonoscopy

## 2021-06-10 NOTE — Assessment & Plan Note (Signed)
Encourage heart healthy diet such as MIND or DASH diet, increase exercise, avoid trans fats, simple carbohydrates and processed foods, consider a krill or fish or flaxseed oil cap daily. She is strongly advised that as a diabetic she 100% should be taking a statin to control her cholesterol but she refuses. She understands that this significantly increases her risk of a bad event such as Stroke or Heart Attack but still declines

## 2021-06-10 NOTE — Assessment & Plan Note (Signed)
Sadly she continues to smoke but she does report she is trying to get ready to quit and hopes to return with good news. She declines medicaitons at this time.

## 2021-06-10 NOTE — Assessment & Plan Note (Signed)
She continues to be stressed but does feel the Alprazolam and Sertraline are helpful. She has just gotten a new job and is very relieved by this. She will be working from home for [employer].

## 2021-06-10 NOTE — Assessment & Plan Note (Signed)
hgba1c unacceptable, minimize simple carbs. Increase exercise as tolerated. She continues to decline medications and reports she will diminished her carbohydrates and processed foods and increase her exercise.

## 2021-07-23 LAB — HM DIABETES EYE EXAM

## 2021-08-21 ENCOUNTER — Telehealth: Payer: Self-pay | Admitting: Family Medicine

## 2021-08-21 DIAGNOSIS — Z79899 Other long term (current) drug therapy: Secondary | ICD-10-CM

## 2021-08-21 NOTE — Telephone Encounter (Signed)
[facility]  Requesting: alprazolam 0.5mg  Contract: 06/09/2021 UDS: 11/01/2019 Last Visit: 06/09/2021 Next Visit: 09/17/2021 Last Refill: 05/25/2021 #120 and 1RF Pt sig: 1 tab qid prn  Please Advise

## 2021-08-21 NOTE — Telephone Encounter (Signed)
Medication is regularly prescribed by PCP, PDMP okay, Rx sent

## 2021-09-09 ENCOUNTER — Other Ambulatory Visit (INDEPENDENT_AMBULATORY_CARE_PROVIDER_SITE_OTHER): Payer: 59

## 2021-09-09 DIAGNOSIS — E1169 Type 2 diabetes mellitus with other specified complication: Secondary | ICD-10-CM | POA: Diagnosis not present

## 2021-09-09 DIAGNOSIS — E669 Obesity, unspecified: Secondary | ICD-10-CM

## 2021-09-09 DIAGNOSIS — E782 Mixed hyperlipidemia: Secondary | ICD-10-CM | POA: Diagnosis not present

## 2021-09-09 DIAGNOSIS — R002 Palpitations: Secondary | ICD-10-CM

## 2021-09-09 LAB — COMPREHENSIVE METABOLIC PANEL
ALT: 21 U/L (ref 0–35)
AST: 20 U/L (ref 0–37)
Albumin: 4.2 g/dL (ref 3.5–5.2)
Alkaline Phosphatase: 55 U/L (ref 39–117)
BUN: 12 mg/dL (ref 6–23)
CO2: 26 mEq/L (ref 19–32)
Calcium: 9.3 mg/dL (ref 8.4–10.5)
Chloride: 104 mEq/L (ref 96–112)
Creatinine, Ser: 0.87 mg/dL (ref 0.40–1.20)
GFR: 72.46 mL/min (ref >60.00)
Glucose, Bld: 184 mg/dL — ABNORMAL HIGH (ref 70–99)
Potassium: 4.1 mEq/L (ref 3.5–5.1)
Sodium: 140 mEq/L (ref 135–145)
Total Bilirubin: 0.5 mg/dL (ref 0.2–1.2)
Total Protein: 7.1 g/dL (ref 6.0–8.3)

## 2021-09-09 LAB — CBC
HCT: 45.1 % (ref 36.0–46.0)
Hemoglobin: 15 g/dL (ref 12.0–15.0)
MCHC: 33.2 g/dL (ref 30.0–36.0)
MCV: 91.7 fl (ref 78.0–100.0)
Platelets: 270 10*3/uL (ref 150.0–400.0)
RBC: 4.92 Mil/uL (ref 3.87–5.11)
RDW: 12.9 % (ref 11.5–15.5)
WBC: 8 10*3/uL (ref 4.0–10.5)

## 2021-09-09 LAB — LIPID PANEL
Cholesterol: 216 mg/dL — ABNORMAL HIGH (ref 0–200)
HDL: 63.7 mg/dL (ref >39.00)
LDL Cholesterol: 116 mg/dL — ABNORMAL HIGH (ref 0–99)
NonHDL: 151.94
Total CHOL/HDL Ratio: 3
Triglycerides: 180 mg/dL — ABNORMAL HIGH (ref 0.0–149.0)
VLDL: 36 mg/dL (ref 0.0–40.0)

## 2021-09-09 LAB — TSH: TSH: 1.95 u[IU]/mL (ref 0.35–5.50)

## 2021-09-09 LAB — HEMOGLOBIN A1C: Hgb A1c MFr Bld: 8.6 % — ABNORMAL HIGH (ref 4.6–6.5)

## 2021-09-17 ENCOUNTER — Telehealth: Payer: 59 | Admitting: Family Medicine

## 2021-09-20 ENCOUNTER — Other Ambulatory Visit: Payer: Self-pay | Admitting: Internal Medicine

## 2021-09-20 DIAGNOSIS — Z79899 Other long term (current) drug therapy: Secondary | ICD-10-CM

## 2021-09-21 NOTE — Telephone Encounter (Signed)
Requesting: alprazolam 0.5mg   Contract: 06/09/2021 UDS: 09/14/2018 Last Visit: 06/09/2021 Next Visit: None Last Refill: 08/21/2021 #120 and 0RF  Please Advise

## 2021-09-25 ENCOUNTER — Telehealth: Payer: Self-pay | Admitting: Family Medicine

## 2021-09-25 NOTE — Telephone Encounter (Signed)
Lvm to call back to set up appointment

## 2021-09-25 NOTE — Telephone Encounter (Signed)
Pt called to set up her 67m follow up, from January. She said it is easiest to reach her though mychart.  Please advise.

## 2021-10-29 ENCOUNTER — Other Ambulatory Visit: Payer: Self-pay | Admitting: Family Medicine

## 2021-10-29 DIAGNOSIS — Z79899 Other long term (current) drug therapy: Secondary | ICD-10-CM

## 2021-10-30 NOTE — Telephone Encounter (Signed)
Requesting: alprazolam 0.5mg   ?Contract: 06/09/2021 ?UDS: 11/01/19 ?Last Visit: 06/09/2021  ?Next Visit: 01/12/2022 ?Last Refill: 09/21/2021 #120 and 0RF ? ?Please Advise ? ?

## 2021-12-02 ENCOUNTER — Other Ambulatory Visit: Payer: Self-pay | Admitting: Family Medicine

## 2021-12-02 DIAGNOSIS — Z79899 Other long term (current) drug therapy: Secondary | ICD-10-CM

## 2021-12-02 NOTE — Telephone Encounter (Signed)
Last OV--06/09/2021 ?Last RF--10/30/2021--#120 no refills (Dr. Carmelia Roller refilled) ?Next scheduled OV with PCP on 01/12/2022 ?UDS/CSC last update on 11/01/2019. ?

## 2021-12-04 ENCOUNTER — Telehealth: Payer: Self-pay | Admitting: *Deleted

## 2021-12-04 DIAGNOSIS — I10 Essential (primary) hypertension: Secondary | ICD-10-CM

## 2021-12-04 DIAGNOSIS — E782 Mixed hyperlipidemia: Secondary | ICD-10-CM

## 2021-12-04 DIAGNOSIS — E669 Obesity, unspecified: Secondary | ICD-10-CM

## 2021-12-04 DIAGNOSIS — E1169 Type 2 diabetes mellitus with other specified complication: Secondary | ICD-10-CM

## 2021-12-04 NOTE — Telephone Encounter (Signed)
Future labs ordered.  

## 2021-12-04 NOTE — Telephone Encounter (Signed)
Pt has lab appointment scheduled for 12/10/21 but no future orders are in Epic.  Please place future orders if appropriate. Thank you! ?

## 2021-12-10 ENCOUNTER — Other Ambulatory Visit (INDEPENDENT_AMBULATORY_CARE_PROVIDER_SITE_OTHER): Payer: 59

## 2021-12-10 DIAGNOSIS — E1169 Type 2 diabetes mellitus with other specified complication: Secondary | ICD-10-CM

## 2021-12-10 DIAGNOSIS — E669 Obesity, unspecified: Secondary | ICD-10-CM | POA: Diagnosis not present

## 2021-12-10 DIAGNOSIS — E782 Mixed hyperlipidemia: Secondary | ICD-10-CM | POA: Diagnosis not present

## 2021-12-10 DIAGNOSIS — I10 Essential (primary) hypertension: Secondary | ICD-10-CM

## 2021-12-10 LAB — CBC
HCT: 45.2 % (ref 36.0–46.0)
Hemoglobin: 15 g/dL (ref 12.0–15.0)
MCHC: 33.3 g/dL (ref 30.0–36.0)
MCV: 91.8 fl (ref 78.0–100.0)
Platelets: 263 10*3/uL (ref 150.0–400.0)
RBC: 4.92 Mil/uL (ref 3.87–5.11)
RDW: 12.9 % (ref 11.5–15.5)
WBC: 7.1 10*3/uL (ref 4.0–10.5)

## 2021-12-10 LAB — LIPID PANEL
Cholesterol: 218 mg/dL — ABNORMAL HIGH (ref 0–200)
HDL: 58.2 mg/dL (ref >39.00)
NonHDL: 159.92
Total CHOL/HDL Ratio: 4
Triglycerides: 223 mg/dL — ABNORMAL HIGH (ref 0.0–149.0)
VLDL: 44.6 mg/dL — ABNORMAL HIGH (ref 0.0–40.0)

## 2021-12-10 LAB — COMPREHENSIVE METABOLIC PANEL
ALT: 16 U/L (ref 0–35)
AST: 15 U/L (ref 0–37)
Albumin: 4.4 g/dL (ref 3.5–5.2)
Alkaline Phosphatase: 61 U/L (ref 39–117)
BUN: 14 mg/dL (ref 6–23)
CO2: 27 mEq/L (ref 19–32)
Calcium: 9.3 mg/dL (ref 8.4–10.5)
Chloride: 103 mEq/L (ref 96–112)
Creatinine, Ser: 0.91 mg/dL (ref 0.40–1.20)
GFR: 68.54 mL/min (ref >60.00)
Glucose, Bld: 268 mg/dL — ABNORMAL HIGH (ref 70–99)
Potassium: 4.3 mEq/L (ref 3.5–5.1)
Sodium: 138 mEq/L (ref 135–145)
Total Bilirubin: 0.5 mg/dL (ref 0.2–1.2)
Total Protein: 7.1 g/dL (ref 6.0–8.3)

## 2021-12-10 LAB — HEMOGLOBIN A1C: Hgb A1c MFr Bld: 8.9 % — ABNORMAL HIGH (ref 4.6–6.5)

## 2021-12-10 LAB — LDL CHOLESTEROL, DIRECT: Direct LDL: 138 mg/dL

## 2021-12-10 LAB — TSH: TSH: 1.39 u[IU]/mL (ref 0.35–5.50)

## 2021-12-13 LAB — DM TEMPLATE

## 2021-12-13 LAB — DRUG MONITORING PANEL 376104, URINE

## 2021-12-27 ENCOUNTER — Other Ambulatory Visit: Payer: Self-pay | Admitting: Family Medicine

## 2022-01-04 ENCOUNTER — Other Ambulatory Visit: Payer: Self-pay | Admitting: Family Medicine

## 2022-01-04 DIAGNOSIS — Z79899 Other long term (current) drug therapy: Secondary | ICD-10-CM

## 2022-01-04 NOTE — Telephone Encounter (Signed)
Requesting: ALPRAZOLAM ?Contract: 05/30/21 ?UDS: 12/10/21 ?Last Visit: 06/09/21 ?Next Visit:01/12/22 ?Last Refill:12/02/21 ? ?Please Advise ? ?

## 2022-01-12 ENCOUNTER — Telehealth (INDEPENDENT_AMBULATORY_CARE_PROVIDER_SITE_OTHER): Payer: 59 | Admitting: Family Medicine

## 2022-01-12 ENCOUNTER — Encounter: Payer: Self-pay | Admitting: Family Medicine

## 2022-01-12 DIAGNOSIS — F17200 Nicotine dependence, unspecified, uncomplicated: Secondary | ICD-10-CM | POA: Diagnosis not present

## 2022-01-12 DIAGNOSIS — E782 Mixed hyperlipidemia: Secondary | ICD-10-CM

## 2022-01-12 DIAGNOSIS — F418 Other specified anxiety disorders: Secondary | ICD-10-CM

## 2022-01-12 DIAGNOSIS — Z79899 Other long term (current) drug therapy: Secondary | ICD-10-CM

## 2022-01-12 DIAGNOSIS — E1169 Type 2 diabetes mellitus with other specified complication: Secondary | ICD-10-CM | POA: Diagnosis not present

## 2022-01-12 DIAGNOSIS — E669 Obesity, unspecified: Secondary | ICD-10-CM

## 2022-01-12 MED ORDER — ALPRAZOLAM 0.5 MG PO TABS: ORAL_TABLET | ORAL | 1 refills | Status: DC

## 2022-01-12 NOTE — Assessment & Plan Note (Signed)
Continues to smoke but is trying to quit.

## 2022-01-12 NOTE — Patient Instructions (Signed)

## 2022-01-12 NOTE — Assessment & Plan Note (Signed)
hgba1c unacceptable, minimize simple carbs. Increase exercise as tolerated. Spoke at length about the need to start medications due to her increasing hgba1c, high cholesterol and smoking but she continues to decline treatment. She is aware she is at high risk for stroke, cardiac disease, MI, renal dysfunction, eye disease and more but still declines. Agrees to repeat labs in 2-3 months and to work on diet and activity more intensely the next 2 months.

## 2022-01-12 NOTE — Assessment & Plan Note (Signed)
Encourage heart healthy diet such as MIND or DASH diet, increase exercise, avoid trans fats, simple carbohydrates and processed foods, consider a krill or fish or flaxseed oil cap daily. Patient declines any statins.

## 2022-01-12 NOTE — Progress Notes (Unsigned)
MyChart Video Visit    Virtual Visit via Video Note   This visit type was conducted due to national recommendations for restrictions regarding the COVID-19 Pandemic (e.g. social distancing) in an effort to limit this patient's exposure and mitigate transmission in our community. This patient is at least at moderate risk for complications without adequate follow up. This format is felt to be most appropriate for this patient at this time. Physical exam was limited by quality of the video and audio technology used for the visit. Burt Ek., CMA was able to get the patient set up on a video visit.  Patient location: Home Patient and provider in visit Provider location: Office  I discussed the limitations of evaluation and management by telemedicine and the availability of in person appointments. The patient expressed understanding and agreed to proceed.  Visit Date: 01/12/2022  Today's healthcare provider: Danise Edge, MD     Subjective:    Patient ID: Ashley Jackson, female    DOB: [birth year], 61 y.o.   MRN: [id number]  Chief Complaint  Patient presents with   Follow-up    HPI Patient is in today for a follow up.  Past Medical History:  Diagnosis Date   Allergic state 01/24/2017   Anxiety state, unspecified 07/12/2007   COLONIC POLYPS, HX OF 05/02/2007   DEPRESSION 07/12/2007   DIABETES MELLITUS, TYPE II, UNCONTROLLED 02/25/2010   EXOGENOUS OBESITY 11/07/2007   Hematuria 06/03/2017   HYPERGLYCEMIA 07/12/2007   HYPERLIPIDEMIA 02/07/2008   Low back pain 04/29/2013   Overweight(278.02) 06/24/2010   Palpitations 01/20/2016   Preventative health care 04/29/2013   Tobacco abuse 10/24/2012   TOBACCO USER 06/24/2010    Past Surgical History:  Procedure Laterality Date   COLONOSCOPY  2006    Family History  Problem Relation Age of Onset   Liver cancer Mother    Hypertension Other    Hypertension Father    Stroke Brother    Seizures Son    Diabetes Maternal Uncle    Breast cancer  Neg Hx    Prostate cancer Neg Hx    Heart disease Neg Hx     Social History   Socioeconomic History   Marital status: Divorced    Spouse name: Not on file   Number of children: Not on file   Years of education: Not on file   Highest education level: Not on file  Occupational History   Not on file  Tobacco Use   Smoking status: Every Day    Packs/day: 1.00    Types: Cigarettes   Smokeless tobacco: Never  Vaping Use   Vaping Use: Never used  Substance and Sexual Activity   Alcohol use: Not Currently   Drug use: No   Sexual activity: Not Currently  Other Topics Concern   Not on file  Social History Narrative   Not on file   Social Determinants of Health   Financial Resource Strain: Not on file  Food Insecurity: Not on file  Transportation Needs: Not on file  Physical Activity: Not on file  Stress: Not on file  Social Connections: Not on file  Intimate Partner Violence: Not on file    Outpatient Medications Prior to Visit  Medication Sig Dispense Refill   ALPRAZolam (XANAX) 0.5 MG tablet TAKE 1 TABLET BY MOUTH 4 TIMES A DAY AS NEEDED FOR ANXIETY 120 tablet 0   sertraline (ZOLOFT) 100 MG tablet TAKE 1 TABLET BY MOUTH EVERY DAY 90 tablet 1   No facility-administered  medications prior to visit.    Allergies  Allergen Reactions   Atarax [Hydroxyzine] Shortness Of Breath    Anxiety, shortness of breath, tachycardia    Cortisone     HEART RACING   Levofloxacin     anxiety   Sulfa Antibiotics     Panic attacks   Sulfonamide Derivatives     REACTION: Panic attacks   Levofloxacin Anxiety    ROS     Objective:    Physical Exam  LMP 12/09/2015  Wt Readings from Last 3 Encounters:  06/09/21 177 lb 6.4 oz (80.5 kg)  08/25/20 171 lb 12.8 oz (77.9 kg)  04/22/20 169 lb 3.2 oz (76.7 kg)    Diabetic Foot Exam - Simple   No data filed    Lab Results  Component Value Date   WBC 7.1 12/10/2021   HGB 15.0 12/10/2021   HCT 45.2 12/10/2021   PLT 263.0  12/10/2021   GLUCOSE 268 (H) 12/10/2021   CHOL 218 (H) 12/10/2021   TRIG 223.0 (H) 12/10/2021   HDL 58.20 12/10/2021   LDLDIRECT 138.0 12/10/2021   LDLCALC 116 (H) 09/09/2021   ALT 16 12/10/2021   AST 15 12/10/2021   NA 138 12/10/2021   K 4.3 12/10/2021   CL 103 12/10/2021   CREATININE 0.91 12/10/2021   BUN 14 12/10/2021   CO2 27 12/10/2021   TSH 1.39 12/10/2021   HGBA1C 8.9 (H) 12/10/2021   MICROALBUR 0.72 10/20/2012    Lab Results  Component Value Date   TSH 1.39 12/10/2021   Lab Results  Component Value Date   WBC 7.1 12/10/2021   HGB 15.0 12/10/2021   HCT 45.2 12/10/2021   MCV 91.8 12/10/2021   PLT 263.0 12/10/2021   Lab Results  Component Value Date   NA 138 12/10/2021   K 4.3 12/10/2021   CO2 27 12/10/2021   GLUCOSE 268 (H) 12/10/2021   BUN 14 12/10/2021   CREATININE 0.91 12/10/2021   BILITOT 0.5 12/10/2021   ALKPHOS 61 12/10/2021   AST 15 12/10/2021   ALT 16 12/10/2021   PROT 7.1 12/10/2021   ALBUMIN 4.4 12/10/2021   CALCIUM 9.3 12/10/2021   GFR 68.54 12/10/2021   Lab Results  Component Value Date   CHOL 218 (H) 12/10/2021   Lab Results  Component Value Date   HDL 58.20 12/10/2021   Lab Results  Component Value Date   LDLCALC 116 (H) 09/09/2021   Lab Results  Component Value Date   TRIG 223.0 (H) 12/10/2021   Lab Results  Component Value Date   CHOLHDL 4 12/10/2021   Lab Results  Component Value Date   HGBA1C 8.9 (H) 12/10/2021       Assessment & Plan:   Problem List Items Addressed This Visit       Endocrine   Diabetes mellitus type 2 in obese (HCC)    hgba1c acceptable, minimize simple carbs. Increase exercise as tolerated. Continue current meds.          Other   Hyperlipidemia, mixed    Encourage heart healthy diet such as MIND or DASH diet, increase exercise, avoid trans fats, simple carbohydrates and processed foods, consider a krill or fish or flaxseed oil cap daily. Patient declines any statins.        TOBACCO  USER    Continues to smoke but is trying to quit.         I am having Ashley Jackson maintain her sertraline and ALPRAZolam.  No orders  of the defined types were placed in this encounter.   I discussed the assessment and treatment plan with the patient. The patient was provided an opportunity to ask questions and all were answered. The patient agreed with the plan and demonstrated an understanding of the instructions.   The patient was advised to call back or seek an in-person evaluation if the symptoms worsen or if the condition fails to improve as anticipated.  I provided *** minutes of face-to-face time during this encounter.   Danise EdgeStacey Marquelle Musgrave, MD [facility] [phone] (phone) [phone] (fax)  [facility]

## 2022-01-13 NOTE — Assessment & Plan Note (Signed)
Doing well on current dose of Sertraline and Alprazolam despite an increasingly stressful job. Refills allowed today

## 2022-01-14 ENCOUNTER — Telehealth: Payer: Self-pay | Admitting: Family Medicine

## 2022-01-14 DIAGNOSIS — E1169 Type 2 diabetes mellitus with other specified complication: Secondary | ICD-10-CM

## 2022-01-14 DIAGNOSIS — E782 Mixed hyperlipidemia: Secondary | ICD-10-CM

## 2022-01-14 NOTE — Telephone Encounter (Signed)
Pt called to schedule routine lab appt and follow up appt with Melissa to go over those results. Locked in Lab appt since it's not until beginning of Aug. However lab orders will be needed for the lab appointment whenever possible.

## 2022-01-15 NOTE — Telephone Encounter (Signed)
Orders placed.

## 2022-02-12 ENCOUNTER — Telehealth: Payer: Self-pay | Admitting: Family Medicine

## 2022-02-12 DIAGNOSIS — Z79899 Other long term (current) drug therapy: Secondary | ICD-10-CM

## 2022-03-02 ENCOUNTER — Encounter: Payer: Self-pay | Admitting: Family Medicine

## 2022-03-03 ENCOUNTER — Other Ambulatory Visit: Payer: Self-pay | Admitting: Family Medicine

## 2022-03-03 MED ORDER — AMOXICILLIN 500 MG PO CAPS: 500.0000 mg | ORAL_CAPSULE | Freq: Three times a day (TID) | ORAL | 0 refills | Status: DC

## 2022-03-15 ENCOUNTER — Other Ambulatory Visit: Payer: Self-pay | Admitting: Internal Medicine

## 2022-03-15 DIAGNOSIS — Z79899 Other long term (current) drug therapy: Secondary | ICD-10-CM

## 2022-03-16 NOTE — Telephone Encounter (Signed)
Requesting: alprazolam 0.5mg   Contract: 06/09/21 UDS: 12/10/21 Last Visit: 01/12/22 Next Visit: 03/26/22 w/ Efraim Kaufmann Last Refill: 02/12/22 #120 and 0RF  Please Advise

## 2022-03-25 ENCOUNTER — Other Ambulatory Visit (INDEPENDENT_AMBULATORY_CARE_PROVIDER_SITE_OTHER): Payer: 59

## 2022-03-25 ENCOUNTER — Telehealth: Payer: Self-pay | Admitting: Family

## 2022-03-25 DIAGNOSIS — E669 Obesity, unspecified: Secondary | ICD-10-CM | POA: Diagnosis not present

## 2022-03-25 DIAGNOSIS — E782 Mixed hyperlipidemia: Secondary | ICD-10-CM

## 2022-03-25 DIAGNOSIS — E1169 Type 2 diabetes mellitus with other specified complication: Secondary | ICD-10-CM

## 2022-03-25 LAB — LIPID PANEL
Cholesterol: 222 mg/dL — ABNORMAL HIGH (ref 0–200)
HDL: 62 mg/dL (ref >39.00)
NonHDL: 159.5
Total CHOL/HDL Ratio: 4
Triglycerides: 250 mg/dL — ABNORMAL HIGH (ref 0.0–149.0)
VLDL: 50 mg/dL — ABNORMAL HIGH (ref 0.0–40.0)

## 2022-03-25 LAB — COMPREHENSIVE METABOLIC PANEL
ALT: 17 U/L (ref 0–35)
AST: 17 U/L (ref 0–37)
Albumin: 4.3 g/dL (ref 3.5–5.2)
Alkaline Phosphatase: 59 U/L (ref 39–117)
BUN: 15 mg/dL (ref 6–23)
CO2: 26 mEq/L (ref 19–32)
Calcium: 9.4 mg/dL (ref 8.4–10.5)
Chloride: 102 mEq/L (ref 96–112)
Creatinine, Ser: 0.85 mg/dL (ref 0.40–1.20)
GFR: 74.23 mL/min (ref >60.00)
Glucose, Bld: 198 mg/dL — ABNORMAL HIGH (ref 70–99)
Potassium: 4.1 mEq/L (ref 3.5–5.1)
Sodium: 138 mEq/L (ref 135–145)
Total Bilirubin: 0.6 mg/dL (ref 0.2–1.2)
Total Protein: 7.2 g/dL (ref 6.0–8.3)

## 2022-03-25 LAB — HEMOGLOBIN A1C: Hgb A1c MFr Bld: 9.2 % — ABNORMAL HIGH (ref 4.6–6.5)

## 2022-03-25 LAB — LDL CHOLESTEROL, DIRECT: Direct LDL: 126 mg/dL

## 2022-03-25 NOTE — Telephone Encounter (Signed)
Opened in error

## 2022-03-26 ENCOUNTER — Telehealth (INDEPENDENT_AMBULATORY_CARE_PROVIDER_SITE_OTHER): Payer: 59 | Admitting: Family

## 2022-03-26 DIAGNOSIS — E669 Obesity, unspecified: Secondary | ICD-10-CM | POA: Diagnosis not present

## 2022-03-26 DIAGNOSIS — E1169 Type 2 diabetes mellitus with other specified complication: Secondary | ICD-10-CM | POA: Diagnosis not present

## 2022-03-26 DIAGNOSIS — F418 Other specified anxiety disorders: Secondary | ICD-10-CM | POA: Diagnosis not present

## 2022-03-26 DIAGNOSIS — E782 Mixed hyperlipidemia: Secondary | ICD-10-CM

## 2022-03-26 NOTE — Assessment & Plan Note (Signed)
Uncontrolled. Declines medical therapy. Reinforced dietary modifications.

## 2022-03-26 NOTE — Progress Notes (Addendum)
Virtual telephone visit    Virtual Visit via Telephone Note   This visit type was conducted due to national recommendations for restrictions regarding the COVID-19 Pandemic (e.g. social distancing) in an effort to limit this patient's exposure and mitigate transmission in our community. Due to her co-morbid illnesses, this patient is at least at moderate risk for complications without adequate follow up. This format is felt to be most appropriate for this patient at this time. The patient did not have access to video technology or had technical difficulties with video requiring transitioning to audio format only (telephone). Physical exam was limited to content and character of the telephone converstion. CMA was able to get the patient set up on a telephone visit.   Patient location: Home Patient and provider in visit Provider location: Office  I discussed the limitations of evaluation and management by telemedicine and the availability of in person appointments. The patient expressed understanding and agreed to proceed.   Visit Date: 03/26/2022  Today's healthcare provider: Lemont Fillers, NP     Subjective:    Patient ID: Ashley Jackson, female    DOB: 1961-02-05, 61 y.o.   MRN: [id number]  No chief complaint on file.   HPI Patient is in today for a telephone office visit  Blood Sugar: During her last visit, her blood sugar levels were elevated. She stated that she will improve her diet. However, as of today's visit, her blood sugars are continuing to elevate. She reports that she started a healthy diet on 03/26/2022. She is not interested in taking medications for her blood sugar Lab Results  Component Value Date   HGBA1C 9.2 (H) 03/25/2022   Cholesterol: Her triglyceride levels are elevating Lab Results  Component Value Date   CHOL 222 (H) 03/25/2022   HDL 62.00 03/25/2022   LDLCALC 116 (H) 09/09/2021   LDLDIRECT 126.0 03/25/2022   TRIG 250.0 (H) 03/25/2022    CHOLHDL 4 03/25/2022   Weight: She reports that she is losing weight and is currently at 165 lbs.  Wt Readings from Last 3 Encounters:  06/09/21 177 lb 6.4 oz (80.5 kg)  08/25/20 171 lb 12.8 oz (77.9 kg)  04/22/20 169 lb 3.2 oz (76.7 kg)   Anxiety: She is currently taking 100 Mg of Zoloft and 0.5 Mg of Xanax.  Colonoscopy: Last completed on 11/30/2004. She is overdue for her colonoscopy. She is not interested in receiving a colonoscopy or Cologuard.  Mammogram: She is not interested in receiving a mammogram   Past Medical History:  Diagnosis Date   Allergic state 01/24/2017   Anxiety state, unspecified 07/12/2007   COLONIC POLYPS, HX OF 05/02/2007   DEPRESSION 07/12/2007   DIABETES MELLITUS, TYPE II, UNCONTROLLED 02/25/2010   EXOGENOUS OBESITY 11/07/2007   Hematuria 06/03/2017   HYPERGLYCEMIA 07/12/2007   HYPERLIPIDEMIA 02/07/2008   Low back pain 04/29/2013   Overweight(278.02) 06/24/2010   Palpitations 01/20/2016   Preventative health care 04/29/2013   Tobacco abuse 10/24/2012   TOBACCO USER 06/24/2010    Past Surgical History:  Procedure Laterality Date   COLONOSCOPY  2006    Family History  Problem Relation Age of Onset   Liver cancer Mother    Hypertension Other    Hypertension Father    Stroke Brother    Seizures Son    Diabetes Maternal Uncle    Breast cancer Neg Hx    Prostate cancer Neg Hx    Heart disease Neg Hx     Social History  Socioeconomic History   Marital status: Divorced    Spouse name: Not on file   Number of children: Not on file   Years of education: Not on file   Highest education level: Not on file  Occupational History   Not on file  Tobacco Use   Smoking status: Every Day    Packs/day: 1.00    Types: Cigarettes   Smokeless tobacco: Never  Vaping Use   Vaping Use: Never used  Substance and Sexual Activity   Alcohol use: Not Currently   Drug use: No   Sexual activity: Not Currently  Other Topics Concern   Not on file  Social History  Narrative   Not on file   Social Determinants of Health   Financial Resource Strain: Not on file  Food Insecurity: Not on file  Transportation Needs: Not on file  Physical Activity: Not on file  Stress: Not on file  Social Connections: Not on file  Intimate Partner Violence: Not on file    Outpatient Medications Prior to Visit  Medication Sig Dispense Refill   ALPRAZolam (XANAX) 0.5 MG tablet TAKE 1 TABLET BY MOUTH 4 TIMES A DAY AS NEEDED FOR ANXIETY 120 tablet 0   sertraline (ZOLOFT) 100 MG tablet TAKE 1 TABLET BY MOUTH EVERY DAY 90 tablet 1   amoxicillin (AMOXIL) 500 MG capsule Take 1 capsule (500 mg total) by mouth 3 (three) times daily. 30 capsule 0   No facility-administered medications prior to visit.    Allergies  Allergen Reactions   Atarax [Hydroxyzine] Shortness Of Breath    Anxiety, shortness of breath, tachycardia    Cortisone     HEART RACING   Levofloxacin     anxiety   Sulfa Antibiotics     Panic attacks   Sulfonamide Derivatives     REACTION: Panic attacks   Levofloxacin Anxiety    ROS See HPI    Objective:    Physical Exam Constitutional:      Appearance: Normal appearance.  Neurological:     Mental Status: She is alert and oriented to person, place, and time.  Psychiatric:        Mood and Affect: Mood normal.        Behavior: Behavior normal.        Thought Content: Thought content normal.        Judgment: Judgment normal.     LMP 12/09/2015  Wt Readings from Last 3 Encounters:  06/09/21 177 lb 6.4 oz (80.5 kg)  08/25/20 171 lb 12.8 oz (77.9 kg)  04/22/20 169 lb 3.2 oz (76.7 kg)    Diabetic Foot Exam - Simple   No data filed    Lab Results  Component Value Date   WBC 7.1 12/10/2021   HGB 15.0 12/10/2021   HCT 45.2 12/10/2021   PLT 263.0 12/10/2021   GLUCOSE 198 (H) 03/25/2022   CHOL 222 (H) 03/25/2022   TRIG 250.0 (H) 03/25/2022   HDL 62.00 03/25/2022   LDLDIRECT 126.0 03/25/2022   LDLCALC 116 (H) 09/09/2021   ALT 17  03/25/2022   AST 17 03/25/2022   NA 138 03/25/2022   K 4.1 03/25/2022   CL 102 03/25/2022   CREATININE 0.85 03/25/2022   BUN 15 03/25/2022   CO2 26 03/25/2022   TSH 1.39 12/10/2021   HGBA1C 9.2 (H) 03/25/2022   MICROALBUR 0.72 10/20/2012    Lab Results  Component Value Date   TSH 1.39 12/10/2021   Lab Results  Component Value Date  WBC 7.1 12/10/2021   HGB 15.0 12/10/2021   HCT 45.2 12/10/2021   MCV 91.8 12/10/2021   PLT 263.0 12/10/2021   Lab Results  Component Value Date   NA 138 03/25/2022   K 4.1 03/25/2022   CO2 26 03/25/2022   GLUCOSE 198 (H) 03/25/2022   BUN 15 03/25/2022   CREATININE 0.85 03/25/2022   BILITOT 0.6 03/25/2022   ALKPHOS 59 03/25/2022   AST 17 03/25/2022   ALT 17 03/25/2022   PROT 7.2 03/25/2022   ALBUMIN 4.3 03/25/2022   CALCIUM 9.4 03/25/2022   GFR 74.23 03/25/2022   Lab Results  Component Value Date   CHOL 222 (H) 03/25/2022   Lab Results  Component Value Date   HDL 62.00 03/25/2022   Lab Results  Component Value Date   LDLCALC 116 (H) 09/09/2021   Lab Results  Component Value Date   TRIG 250.0 (H) 03/25/2022   Lab Results  Component Value Date   CHOLHDL 4 03/25/2022   Lab Results  Component Value Date   HGBA1C 9.2 (H) 03/25/2022       Assessment & Plan:   Problem List Items Addressed This Visit       Unprioritized   Hyperlipidemia, mixed    Uncontrolled. Declines medical therapy. Reinforced dietary modifications.       Diabetes mellitus type 2 in obese (HCC)    Uncontrolled. Recommended oral DM med in addition to her plans to begin working on diet. She adamantly declines any medication.  She also declines all preventative care screenings and recommended immunizations.      Depression with anxiety    Controlled on sertraline and prn xanax.       11 minutes spent on today's phone call.    I discussed the assessment and treatment plan with the patient. The patient was provided an opportunity to ask  questions and all were answered. The patient agreed with the plan and demonstrated an understanding of the instructions.   The patient was advised to call back or seek an in-person evaluation if the symptoms worsen or if the condition fails to improve as anticipated.  I provided 20 minutes of non-face-to-face time during this encounter.   I,Amber Collins,acting as a Neurosurgeon for [employer], NP.,have documented all relevant documentation on the behalf of Lemont Fillers, NP,as directed by  Lemont Fillers, NP while in the presence of Lemont Fillers, NP.   Lemont Fillers, NP [employer] [facility] [employer] [phone] (phone) [phone] (fax)  [facility]

## 2022-03-26 NOTE — Assessment & Plan Note (Signed)
Controlled on sertraline and prn xanax.

## 2022-03-26 NOTE — Assessment & Plan Note (Signed)
Uncontrolled. Recommended oral DM med in addition to her plans to begin working on diet. She adamantly declines any medication.  She also declines all preventative care screenings and recommended immunizations.

## 2022-03-29 ENCOUNTER — Other Ambulatory Visit: Payer: Self-pay

## 2022-03-29 ENCOUNTER — Telehealth: Payer: Self-pay | Admitting: Family Medicine

## 2022-03-29 DIAGNOSIS — E1169 Type 2 diabetes mellitus with other specified complication: Secondary | ICD-10-CM

## 2022-03-29 DIAGNOSIS — E669 Obesity, unspecified: Secondary | ICD-10-CM

## 2022-03-29 NOTE — Telephone Encounter (Signed)
Patient states she spoke to Sandford Craze and was asked to call office and schedule an A1C lab. Patient is scheduled but no lab orders are placed. Please advise.

## 2022-04-18 ENCOUNTER — Other Ambulatory Visit: Payer: Self-pay | Admitting: Family Medicine

## 2022-04-18 DIAGNOSIS — Z79899 Other long term (current) drug therapy: Secondary | ICD-10-CM

## 2022-04-19 NOTE — Telephone Encounter (Signed)
Requesting: alprazolam 0.5mg   Contract: 06/09/21 UDS: 12/10/21 Last Visit: 03/26/22 w/ Efraim Kaufmann Next Visit: 07/01/22 Last Refill: 03/16/22 #120 and 0RF   Please Advise

## 2022-04-19 NOTE — Telephone Encounter (Signed)
Requesting: Xanax 0.5mg  Contract: 05/30/21 UDS: 1820/23 Last Visit: 03/26/22 VV  Last Refill: 03/16/22  Please Advise

## 2022-05-24 ENCOUNTER — Other Ambulatory Visit: Payer: Self-pay | Admitting: Family Medicine

## 2022-05-24 DIAGNOSIS — Z79899 Other long term (current) drug therapy: Secondary | ICD-10-CM

## 2022-05-25 NOTE — Telephone Encounter (Signed)
Requesting: alprazolam 0.5mg   Contract:06/09/21 UDS:12/10/21 Last Visit: 03/26/22 w/ Lenna Sciara Next Visit: 07/01/22 Last Refill: 04/19/22 #120 and 0RF   Please Advise

## 2022-06-24 ENCOUNTER — Other Ambulatory Visit (INDEPENDENT_AMBULATORY_CARE_PROVIDER_SITE_OTHER): Payer: Commercial Managed Care - HMO

## 2022-06-24 DIAGNOSIS — E669 Obesity, unspecified: Secondary | ICD-10-CM

## 2022-06-24 DIAGNOSIS — E1169 Type 2 diabetes mellitus with other specified complication: Secondary | ICD-10-CM | POA: Diagnosis not present

## 2022-06-24 LAB — HEMOGLOBIN A1C: Hgb A1c MFr Bld: 8.9 % — ABNORMAL HIGH (ref 4.6–6.5)

## 2022-06-26 ENCOUNTER — Other Ambulatory Visit: Payer: Self-pay | Admitting: Family Medicine

## 2022-06-27 ENCOUNTER — Other Ambulatory Visit: Payer: Self-pay | Admitting: Family Medicine

## 2022-06-27 DIAGNOSIS — Z79899 Other long term (current) drug therapy: Secondary | ICD-10-CM

## 2022-06-28 NOTE — Telephone Encounter (Signed)
Requesting: alprazolam 0.5mg   Contract: 06/09/21 UDS: 12/10/21 Last Visit: 03/26/22 w/ Lenna Sciara Next Visit: 07/01/22 Last Refill: 05/25/22 #120 and 0RF   Please Advise

## 2022-07-01 ENCOUNTER — Ambulatory Visit (INDEPENDENT_AMBULATORY_CARE_PROVIDER_SITE_OTHER): Payer: Commercial Managed Care - HMO | Admitting: Family Medicine

## 2022-07-01 VITALS — BP 128/74 | HR 95 | Temp 98.0°F | Resp 16 | Ht 63.0 in | Wt 171.8 lb

## 2022-07-01 DIAGNOSIS — E782 Mixed hyperlipidemia: Secondary | ICD-10-CM | POA: Diagnosis not present

## 2022-07-01 DIAGNOSIS — E1169 Type 2 diabetes mellitus with other specified complication: Secondary | ICD-10-CM | POA: Diagnosis not present

## 2022-07-01 DIAGNOSIS — F17200 Nicotine dependence, unspecified, uncomplicated: Secondary | ICD-10-CM

## 2022-07-01 DIAGNOSIS — F418 Other specified anxiety disorders: Secondary | ICD-10-CM | POA: Diagnosis not present

## 2022-07-01 DIAGNOSIS — E669 Obesity, unspecified: Secondary | ICD-10-CM | POA: Diagnosis not present

## 2022-07-01 DIAGNOSIS — Z79899 Other long term (current) drug therapy: Secondary | ICD-10-CM

## 2022-07-01 MED ORDER — ALPRAZOLAM 0.5 MG PO TABS: ORAL_TABLET | ORAL | 0 refills | Status: DC

## 2022-07-01 NOTE — Assessment & Plan Note (Addendum)
Stable on current meds. Refills given on Alprazolam

## 2022-07-01 NOTE — Assessment & Plan Note (Addendum)
Encourage heart healthy diet such as MIND or DASH diet, increase exercise, avoid trans fats, simple carbohydrates and processed foods, consider a krill or fish or flaxseed oil cap daily.  Patient continues to decline medications.

## 2022-07-01 NOTE — Patient Instructions (Signed)

## 2022-07-01 NOTE — Assessment & Plan Note (Addendum)
hgba1c acceptable, minimize simple carbs. Increase exercise as tolerated. Patient continues to decline meds despite warnings of kidney, heart, eye disease etc. She agrees to labs in 3-4 months to monitor

## 2022-07-01 NOTE — Progress Notes (Addendum)
Subjective:   By signing my name below, I, Kellie Simmering, attest that this documentation has been prepared under the direction and in the presence of Mosie Lukes, MD., 07/01/2022.   Patient ID: Ashley Jackson, female    DOB: 1960/12/25, 61 y.o.   MRN: [id number]  Chief Complaint  Patient presents with   Follow-up    Follow up   HPI Patient is in today for an office visit.  Diabetes Mellitus: Patient's A1C remains elevated but she is not interested in taking medications to manage this. Lab Results  Component Value Date   HGBA1C 8.9 (H) 06/24/2022   Diet/Weight: Patient's weight has reduced due to consciousness about her carbohydrate consumption. Wt Readings from Last 3 Encounters:  07/01/22 171 lb 12.8 oz (77.9 kg)  06/09/21 177 lb 6.4 oz (80.5 kg)  08/25/20 171 lb 12.8 oz (77.9 kg)   Refills: Patient is requesting refills on Alprazolam 0.5 mg and Sertraline 100 mg.  Stress: Patient reports that stress causes her to feel feverish and her tonsils to enlarge. She states that antibiotics effectively resolve these symptoms and is requesting a prescription for Amoxicillin.   Past Medical History:  Diagnosis Date   Allergic state 01/24/2017   Anxiety state, unspecified 07/12/2007   COLONIC POLYPS, HX OF 05/02/2007   DEPRESSION 07/12/2007   DIABETES MELLITUS, TYPE II, UNCONTROLLED 02/25/2010   EXOGENOUS OBESITY 11/07/2007   Hematuria 06/03/2017   HYPERGLYCEMIA 07/12/2007   HYPERLIPIDEMIA 02/07/2008   Low back pain 04/29/2013   Overweight(278.02) 06/24/2010   Palpitations 01/20/2016   Preventative health care 04/29/2013   Tobacco abuse 10/24/2012   TOBACCO USER 06/24/2010   Past Surgical History:  Procedure Laterality Date   COLONOSCOPY  2006   Family History  Problem Relation Age of Onset   Liver cancer Mother    Hypertension Other    Hypertension Father    Stroke Brother    Seizures Son    Diabetes Maternal Uncle    Breast cancer Neg Hx    Prostate cancer Neg Hx    Heart  disease Neg Hx    Social History   Socioeconomic History   Marital status: Divorced    Spouse name: Not on file   Number of children: Not on file   Years of education: Not on file   Highest education level: Not on file  Occupational History   Not on file  Tobacco Use   Smoking status: Every Day    Packs/day: 1.00    Types: Cigarettes   Smokeless tobacco: Never  Vaping Use   Vaping Use: Never used  Substance and Sexual Activity   Alcohol use: Not Currently   Drug use: No   Sexual activity: Not Currently  Other Topics Concern   Not on file  Social History Narrative   Not on file   Social Determinants of Health   Financial Resource Strain: Not on file  Food Insecurity: Not on file  Transportation Needs: Not on file  Physical Activity: Not on file  Stress: Not on file  Social Connections: Not on file  Intimate Partner Violence: Not on file   Outpatient Medications Prior to Visit  Medication Sig Dispense Refill   sertraline (ZOLOFT) 100 MG tablet TAKE 1 TABLET BY MOUTH EVERY DAY 90 tablet 1   ALPRAZolam (XANAX) 0.5 MG tablet TAKE 1 TABLET BY MOUTH 4 TIMES A DAY AS NEEDED FOR ANXIETY 120 tablet 0   No facility-administered medications prior to visit.   Allergies  Allergen Reactions   Atarax [Hydroxyzine] Shortness Of Breath    Anxiety, shortness of breath, tachycardia    Cortisone     HEART RACING   Levofloxacin     anxiety   Sulfa Antibiotics     Panic attacks   Sulfonamide Derivatives     REACTION: Panic attacks   Levofloxacin Anxiety   ROS    Objective:    Physical Exam Constitutional:      General: She is not in acute distress.    Appearance: Normal appearance. She is not ill-appearing.  HENT:     Head: Normocephalic and atraumatic.     Right Ear: External ear normal.     Left Ear: External ear normal.     Mouth/Throat:     Mouth: Mucous membranes are moist.     Pharynx: Oropharynx is clear.  Eyes:     Extraocular Movements: Extraocular  movements intact.     Pupils: Pupils are equal, round, and reactive to light.  Cardiovascular:     Rate and Rhythm: Normal rate and regular rhythm.     Pulses: Normal pulses.     Heart sounds: Normal heart sounds. No murmur heard.    No gallop.  Pulmonary:     Effort: Pulmonary effort is normal. No respiratory distress.     Breath sounds: Normal breath sounds. No wheezing or rales.  Abdominal:     General: Bowel sounds are normal.  Skin:    General: Skin is warm and dry.  Neurological:     Mental Status: She is alert and oriented to person, place, and time.  Psychiatric:        Mood and Affect: Mood normal.        Behavior: Behavior normal.        Judgment: Judgment normal.    BP 128/74 (BP Location: Right Arm, Patient Position: Sitting, Cuff Size: Normal)   Pulse 95   Temp 98 F (36.7 C) (Oral)   Resp 16   Ht 5\' 3"  (1.6 m)   Wt 171 lb 12.8 oz (77.9 kg)   LMP 12/09/2015   SpO2 95%   BMI 30.43 kg/m  Wt Readings from Last 3 Encounters:  07/01/22 171 lb 12.8 oz (77.9 kg)  06/09/21 177 lb 6.4 oz (80.5 kg)  08/25/20 171 lb 12.8 oz (77.9 kg)   Diabetic Foot Exam - Simple   No data filed    Lab Results  Component Value Date   WBC 7.1 12/10/2021   HGB 15.0 12/10/2021   HCT 45.2 12/10/2021   PLT 263.0 12/10/2021   GLUCOSE 198 (H) 03/25/2022   CHOL 222 (H) 03/25/2022   TRIG 250.0 (H) 03/25/2022   HDL 62.00 03/25/2022   LDLDIRECT 126.0 03/25/2022   LDLCALC 116 (H) 09/09/2021   ALT 17 03/25/2022   AST 17 03/25/2022   NA 138 03/25/2022   K 4.1 03/25/2022   CL 102 03/25/2022   CREATININE 0.85 03/25/2022   BUN 15 03/25/2022   CO2 26 03/25/2022   TSH 1.39 12/10/2021   HGBA1C 8.9 (H) 06/24/2022   MICROALBUR 0.72 10/20/2012   Lab Results  Component Value Date   TSH 1.39 12/10/2021   Lab Results  Component Value Date   WBC 7.1 12/10/2021   HGB 15.0 12/10/2021   HCT 45.2 12/10/2021   MCV 91.8 12/10/2021   PLT 263.0 12/10/2021   Lab Results  Component Value  Date   NA 138 03/25/2022   K 4.1 03/25/2022   CO2 26  03/25/2022   GLUCOSE 198 (H) 03/25/2022   BUN 15 03/25/2022   CREATININE 0.85 03/25/2022   BILITOT 0.6 03/25/2022   ALKPHOS 59 03/25/2022   AST 17 03/25/2022   ALT 17 03/25/2022   PROT 7.2 03/25/2022   ALBUMIN 4.3 03/25/2022   CALCIUM 9.4 03/25/2022   GFR 74.23 03/25/2022   Lab Results  Component Value Date   CHOL 222 (H) 03/25/2022   Lab Results  Component Value Date   HDL 62.00 03/25/2022   Lab Results  Component Value Date   LDLCALC 116 (H) 09/09/2021   Lab Results  Component Value Date   TRIG 250.0 (H) 03/25/2022   Lab Results  Component Value Date   CHOLHDL 4 03/25/2022   Lab Results  Component Value Date   HGBA1C 8.9 (H) 06/24/2022      Assessment & Plan:   Problem List Items Addressed This Visit     Diabetes mellitus type 2 in obese (HCC) - Primary    hgba1c acceptable, minimize simple carbs. Increase exercise as tolerated. Patient continues to decline meds despite warnings of kidney, heart, eye disease etc. She agrees to labs in 3-4 months to monitor      Relevant Orders   Hemoglobin A1c   Comprehensive metabolic panel   Lipid panel   Hyperlipidemia, mixed    Encourage heart healthy diet such as MIND or DASH diet, increase exercise, avoid trans fats, simple carbohydrates and processed foods, consider a krill or fish or flaxseed oil cap daily.  Patient continues to decline medications.       TOBACCO USER    Encouraged complete cessation. Discussed need to quit as relates to risk of numerous cancers, cardiac and pulmonary disease as well as neurologic complications. Counseled for greater than 3 minutes       Depression with anxiety    Stable on current meds. Refills given on Alprazolam      Relevant Medications   ALPRAZolam (XANAX) 0.5 MG tablet   Other Visit Diagnoses     High risk medication use       Relevant Medications   ALPRAZolam (XANAX) 0.5 MG tablet      Meds ordered this  encounter  Medications   ALPRAZolam (XANAX) 0.5 MG tablet    Sig: TAKE 1 TABLET BY MOUTH 4 TIMES A DAY AS NEEDED FOR ANXIETY    Dispense:  120 tablet    Refill:  0    Not to exceed 5 additional fills before 11/21/2022   I, Danise Edge, MD, personally preformed the services described in this documentation.  All medical record entries made by the scribe were at my direction and in my presence.  I have reviewed the chart and discharge instructions (if applicable) and agree that the record reflects my personal performance and is accurate and complete. 07/01/2022  I,Mohammed Iqbal,acting as a scribe for Danise Edge, MD.,have documented all relevant documentation on the behalf of Danise Edge, MD,as directed by  Danise Edge, MD while in the presence of Danise Edge, MD.  Danise Edge, MD

## 2022-07-01 NOTE — Assessment & Plan Note (Signed)
Encouraged complete cessation. Discussed need to quit as relates to risk of numerous cancers, cardiac and pulmonary disease as well as neurologic complications. Counseled for greater than 3 minutes 

## 2022-07-02 NOTE — Addendum Note (Signed)
Addended by: Mervin Kung A on: 07/02/2022 08:43 AM   Modules accepted: Orders

## 2022-09-04 ENCOUNTER — Other Ambulatory Visit: Payer: Self-pay | Admitting: Family Medicine

## 2022-09-04 DIAGNOSIS — Z79899 Other long term (current) drug therapy: Secondary | ICD-10-CM

## 2022-09-06 NOTE — Telephone Encounter (Signed)
Requesting: Xanax Contract: 06/09/2021 UDS: 12/10/2021 Last Visit: 07/01/2022 Next Visit: 10/19/2022 Last Refill: 07/01/2022  Please Advise

## 2022-10-07 ENCOUNTER — Other Ambulatory Visit: Payer: Self-pay | Admitting: Family Medicine

## 2022-10-07 DIAGNOSIS — Z79899 Other long term (current) drug therapy: Secondary | ICD-10-CM

## 2022-10-07 NOTE — Telephone Encounter (Signed)
Requesting: alprazolam 0.26m  Contract: 06/09/21 UDS: 12/10/21 Last Visit: 07/01/22 Next Visit: 11/05/22 w/ MLenna SciaraLast Refill:  09/06/22 #120 and 0RF   Please Advise

## 2022-10-12 ENCOUNTER — Other Ambulatory Visit: Payer: Commercial Managed Care - HMO

## 2022-10-19 ENCOUNTER — Ambulatory Visit: Payer: Commercial Managed Care - HMO | Admitting: Family Medicine

## 2022-10-29 ENCOUNTER — Other Ambulatory Visit (INDEPENDENT_AMBULATORY_CARE_PROVIDER_SITE_OTHER): Payer: Commercial Managed Care - HMO

## 2022-10-29 DIAGNOSIS — E1169 Type 2 diabetes mellitus with other specified complication: Secondary | ICD-10-CM

## 2022-10-29 DIAGNOSIS — E782 Mixed hyperlipidemia: Secondary | ICD-10-CM

## 2022-10-29 DIAGNOSIS — E669 Obesity, unspecified: Secondary | ICD-10-CM

## 2022-10-29 LAB — COMPREHENSIVE METABOLIC PANEL
ALT: 22 U/L (ref 0–35)
AST: 18 U/L (ref 0–37)
Albumin: 4 g/dL (ref 3.5–5.2)
Alkaline Phosphatase: 58 U/L (ref 39–117)
BUN: 11 mg/dL (ref 6–23)
CO2: 28 mEq/L (ref 19–32)
Calcium: 9.3 mg/dL (ref 8.4–10.5)
Chloride: 99 mEq/L (ref 96–112)
Creatinine, Ser: 0.96 mg/dL (ref 0.40–1.20)
GFR: 63.88 mL/min (ref >60.00)
Glucose, Bld: 266 mg/dL — ABNORMAL HIGH (ref 70–99)
Potassium: 4.1 mEq/L (ref 3.5–5.1)
Sodium: 137 mEq/L (ref 135–145)
Total Bilirubin: 0.6 mg/dL (ref 0.2–1.2)
Total Protein: 7.1 g/dL (ref 6.0–8.3)

## 2022-10-29 LAB — LIPID PANEL
Cholesterol: 194 mg/dL (ref 0–200)
HDL: 56 mg/dL (ref >39.00)
LDL Cholesterol: 101 mg/dL — ABNORMAL HIGH (ref 0–99)
NonHDL: 137.63
Total CHOL/HDL Ratio: 3
Triglycerides: 184 mg/dL — ABNORMAL HIGH (ref 0.0–149.0)
VLDL: 36.8 mg/dL (ref 0.0–40.0)

## 2022-10-29 LAB — HEMOGLOBIN A1C: Hgb A1c MFr Bld: 9.3 % — ABNORMAL HIGH (ref 4.6–6.5)

## 2022-11-02 ENCOUNTER — Other Ambulatory Visit: Payer: Self-pay

## 2022-11-02 ENCOUNTER — Encounter: Payer: Self-pay | Admitting: Family Medicine

## 2022-11-03 ENCOUNTER — Other Ambulatory Visit: Payer: Self-pay

## 2022-11-03 MED ORDER — KETONE BLOOD TEST VI STRP: ORAL_STRIP | 1 refills | Status: DC

## 2022-11-03 NOTE — Telephone Encounter (Signed)
Test stripssent

## 2022-11-05 ENCOUNTER — Ambulatory Visit (INDEPENDENT_AMBULATORY_CARE_PROVIDER_SITE_OTHER): Payer: Commercial Managed Care - HMO | Admitting: Family

## 2022-11-05 ENCOUNTER — Other Ambulatory Visit: Payer: Self-pay | Admitting: Family Medicine

## 2022-11-05 ENCOUNTER — Telehealth: Payer: Self-pay | Admitting: Family Medicine

## 2022-11-05 VITALS — BP 139/82 | HR 90 | Temp 97.5°F | Resp 16 | Wt 170.0 lb

## 2022-11-05 DIAGNOSIS — Z79899 Other long term (current) drug therapy: Secondary | ICD-10-CM

## 2022-11-05 DIAGNOSIS — F17210 Nicotine dependence, cigarettes, uncomplicated: Secondary | ICD-10-CM

## 2022-11-05 DIAGNOSIS — F17200 Nicotine dependence, unspecified, uncomplicated: Secondary | ICD-10-CM

## 2022-11-05 DIAGNOSIS — E782 Mixed hyperlipidemia: Secondary | ICD-10-CM | POA: Diagnosis not present

## 2022-11-05 DIAGNOSIS — E669 Obesity, unspecified: Secondary | ICD-10-CM | POA: Diagnosis not present

## 2022-11-05 DIAGNOSIS — E1169 Type 2 diabetes mellitus with other specified complication: Secondary | ICD-10-CM | POA: Diagnosis not present

## 2022-11-05 NOTE — Assessment & Plan Note (Signed)
Recommended that she begin atorvastatin.  Pt declines statin.

## 2022-11-05 NOTE — Telephone Encounter (Signed)
Requesting: alprazolam 0.5mg  Contract: No UDS: 12/10/21 Last Visit: 07/01/2022 Next Visit: 02/14/2023 Last Refill: 10/08/22  Please Advise

## 2022-11-05 NOTE — Assessment & Plan Note (Signed)
Encouraged smoking cessation. Pt is not interested in quitting. 

## 2022-11-05 NOTE — Progress Notes (Signed)
Subjective:   By signing my name below, I, Madelin Rear, attest that this documentation has been prepared under the direction and in the presence of Debbrah Alar, NP. 11/05/2022.   Patient ID: Ashley Jackson, female    DOB: 09/22/1960, 62 y.o.   MRN: [id number]  No chief complaint on file.   HPI Patient is in today for an office visit.  Blood pressure:  Her blood pressure is stable in clinic today. BP Readings from Last 3 Encounters:  11/05/22 139/82  07/01/22 128/74  06/09/21 122/78   Diabetes:  She does have a glucose meter, which she brings in today. She is concerned that the device is 62 years old.  Lab Results  Component Value Date   HGBA1C 9.3 (H) 10/29/2022   Weight: She reports losing 4 lbs. Her main issue has been eating too many sweets. Recently she has started avoiding her staples such as milkshakes and cookies. Consumed nothing sweet except an apple since last Friday. Since then she is feeling much better and has not needed to urinate as frequently.  Wt Readings from Last 3 Encounters:  11/05/22 170 lb (77.1 kg)  07/01/22 171 lb 12.8 oz (77.9 kg)  06/09/21 177 lb 6.4 oz (80.5 kg)   Family history:  Her brother has hyperlipidemia. At one time he was prescribed atorvastatin which he could not tolerate.   Tobacco use:  She is a current smoker. At this time she is not willing to quit. However, since last week she has started cutting back. 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  Past Medical History:  Diagnosis Date   Allergic state 01/24/2017   Anxiety state, unspecified 07/12/2007   COLONIC POLYPS, HX OF 05/02/2007   DEPRESSION 07/12/2007   DIABETES MELLITUS, TYPE II, UNCONTROLLED 02/25/2010   EXOGENOUS OBESITY 11/07/2007   Hematuria 06/03/2017   HYPERGLYCEMIA 07/12/2007   HYPERLIPIDEMIA 02/07/2008   Low back pain 04/29/2013   Overweight(278.02) 06/24/2010   Palpitations 01/20/2016   Preventative health care 04/29/2013   Tobacco abuse 10/24/2012    TOBACCO USER 06/24/2010    Past Surgical History:  Procedure Laterality Date   COLONOSCOPY  2006    Family History  Problem Relation Age of Onset   Liver cancer Mother    Hypertension Other    Hypertension Father    Stroke Brother    Seizures Son    Diabetes Maternal Uncle    Breast cancer Neg Hx    Prostate cancer Neg Hx    Heart disease Neg Hx     Social History   Socioeconomic History   Marital status: Divorced    Spouse name: Not on file   Number of children: Not on file   Years of education: Not on file   Highest education level: Not on file  Occupational History   Not on file  Tobacco Use   Smoking status: Every Day    Packs/day: 1    Types: Cigarettes   Smokeless tobacco: Never  Vaping Use   Vaping Use: Never used  Substance and Sexual Activity   Alcohol use: Not Currently   Drug use: No   Sexual activity: Not Currently  Other Topics Concern   Not on file  Social History Narrative   Not on file   Social Determinants of Health   Financial Resource Strain: Not on file  Food Insecurity: Not on file  Transportation Needs: Not on file  Physical Activity: Not on file  Stress: Not on  file  Social Connections: Not on file  Intimate Partner Violence: Not on file    Outpatient Medications Prior to Visit  Medication Sig Dispense Refill   Ketone Blood Test STRP Check blood sugar once daily E11.9 100 strip 1   ALPRAZolam (XANAX) 0.5 MG tablet TAKE 1 TABLET BY MOUTH 4 TIMES A DAY AS NEEDED FOR ANXIETY 120 tablet 0   sertraline (ZOLOFT) 100 MG tablet TAKE 1 TABLET BY MOUTH EVERY DAY 90 tablet 1   No facility-administered medications prior to visit.    Allergies  Allergen Reactions   Atarax [Hydroxyzine] Shortness Of Breath    Anxiety, shortness of breath, tachycardia    Cortisone     HEART RACING   Levofloxacin     anxiety   Sulfa Antibiotics     Panic attacks   Sulfonamide Derivatives     REACTION: Panic attacks   Levofloxacin Anxiety     ROS See HPI.     Objective:    Physical Exam Constitutional:      Appearance: Normal appearance.  HENT:     Head: Normocephalic and atraumatic.     Right Ear: Tympanic membrane, ear canal and external ear normal.     Left Ear: Tympanic membrane, ear canal and external ear normal.  Eyes:     Extraocular Movements: Extraocular movements intact.     Pupils: Pupils are equal, round, and reactive to light.  Cardiovascular:     Rate and Rhythm: Normal rate and regular rhythm.     Heart sounds: Normal heart sounds. No murmur heard.    No gallop.  Pulmonary:     Effort: Pulmonary effort is normal. No respiratory distress.     Breath sounds: Normal breath sounds. No wheezing or rales.  Skin:    General: Skin is warm and dry.  Neurological:     General: No focal deficit present.     Mental Status: She is alert and oriented to person, place, and time.  Psychiatric:        Mood and Affect: Mood normal.        Behavior: Behavior normal.     BP 139/82 (BP Location: Right Arm, Patient Position: Sitting, Cuff Size: Small)   Pulse 90   Temp (!) 97.5 F (36.4 C) (Oral)   Resp 16   Wt 170 lb (77.1 kg)   LMP 12/09/2015   SpO2 99%   BMI 30.11 kg/m  Wt Readings from Last 3 Encounters:  11/05/22 170 lb (77.1 kg)  07/01/22 171 lb 12.8 oz (77.9 kg)  06/09/21 177 lb 6.4 oz (80.5 kg)         Assessment & Plan:   Problem List Items Addressed This Visit       Unprioritized   TOBACCO USER - Primary    Encouraged smoking cessation.  Pt is not interested in quitting.        Hyperlipidemia, mixed    Recommended that she begin atorvastatin.  Pt declines statin.       Diabetes mellitus type 2 in obese [facility])    Last visit over the summer she stated that she would like to focus on diet rather than medication, however unfortunately her A1C has gone up since that time.  Recommended metformin. She declines medication for her diabetes. We discussed that the damage that is done over  time from hyperglycemia is irreparable.  She states she understands this. She will need a urine microalbumin checked next time she goes to the lab.  Last visit she also declined all preventative care including flu shot, mammogram, pap smear.   Lab Results  Component Value Date   HGBA1C 9.3 (H) 10/29/2022   HGBA1C 8.9 (H) 06/24/2022   HGBA1C 9.2 (H) 03/25/2022   Lab Results  Component Value Date   MICROALBUR 0.72 10/20/2012   LDLCALC 101 (H) 10/29/2022   CREATININE 0.96 10/29/2022        20 minutes spent on today's visit. The majority of this time was spent on counseling re: risks of tobacco abuse, hyperlipidemia and uncontrolled Diabetes.   No orders of the defined types were placed in this encounter.   I, Nance Pear, NP, personally preformed the services described in this documentation.  All medical record entries made by the scribe were at my direction and in my presence.  I have reviewed the chart and discharge instructions (if applicable) and agree that the record reflects my personal performance and is accurate and complete. 11/05/2022.  I,Mathew Stumpf,acting as a Education administrator for [employer], NP.,have documented all relevant documentation on the behalf of Nance Pear, NP,as directed by  Nance Pear, NP while in the presence of Nance Pear, NP.   Nance Pear, NP

## 2022-11-05 NOTE — Telephone Encounter (Signed)
If you are ok with this, when should she come back?  I will advise that insurance may not cover.

## 2022-11-05 NOTE — Assessment & Plan Note (Signed)
Last visit over the summer she stated that she would like to focus on diet rather than medication, however unfortunately her A1C has gone up since that time.  Recommended metformin. She declines medication for her diabetes. We discussed that the damage that is done over time from hyperglycemia is irreparable.  She states she understands this. She will need a urine microalbumin checked next time she goes to the lab.   Last visit she also declined all preventative care including flu shot, mammogram, pap smear.   Lab Results  Component Value Date   HGBA1C 9.3 (H) 10/29/2022   HGBA1C 8.9 (H) 06/24/2022   HGBA1C 9.2 (H) 03/25/2022   Lab Results  Component Value Date   MICROALBUR 0.72 10/20/2012   LDLCALC 101 (H) 10/29/2022   CREATININE 0.96 10/29/2022

## 2022-11-05 NOTE — Telephone Encounter (Signed)
Patient would like to come back to get her A1C checked prior to her 3 month f/u with Dr. Charlett Blake. Please advise.

## 2022-11-08 ENCOUNTER — Encounter: Payer: Self-pay | Admitting: Family Medicine

## 2022-11-08 NOTE — Telephone Encounter (Signed)
Left message for patient to call back  

## 2022-11-09 MED ORDER — BLOOD GLUCOSE TEST VI STRP: ORAL_STRIP | 12 refills | Status: DC

## 2022-11-09 NOTE — Addendum Note (Signed)
Addended by: Kem Boroughs D on: 11/09/2022 02:45 PM   Modules accepted: Orders

## 2022-11-09 NOTE — Telephone Encounter (Signed)
Patient is aware of Dr. Frederik Pear message, but she states that the week prior her OV is past the 8 weeks time frame. Please advise.

## 2022-11-09 NOTE — Telephone Encounter (Addendum)
Left message on machine to call back  Pt can schedule a lab only appointment on or around 12/20/22.  Lab order placed.

## 2022-11-09 NOTE — Telephone Encounter (Signed)
Called pt lvm to call our office back to just let  us know what she would like to do regarding labs.

## 2022-11-11 ENCOUNTER — Encounter: Payer: Self-pay | Admitting: Family Medicine

## 2022-11-12 MED ORDER — ACCU-CHEK GUIDE VI STRP: ORAL_STRIP | 1 refills | Status: DC

## 2022-11-12 MED ORDER — ACCU-CHEK GUIDE W/DEVICE KIT: PACK | 0 refills | Status: DC

## 2022-11-12 MED ORDER — ACCU-CHEK SOFTCLIX LANCETS MISC: 1 refills | Status: DC

## 2022-11-16 ENCOUNTER — Other Ambulatory Visit: Payer: Self-pay

## 2022-11-16 ENCOUNTER — Other Ambulatory Visit: Payer: Self-pay | Admitting: Family Medicine

## 2022-11-16 ENCOUNTER — Encounter: Payer: Self-pay | Admitting: Family Medicine

## 2022-11-16 MED ORDER — ONETOUCH ULTRA VI STRP: ORAL_STRIP | 1 refills | Status: DC

## 2022-11-16 MED ORDER — ACCU-CHEK GUIDE VI STRP: ORAL_STRIP | 1 refills | Status: DC

## 2022-11-16 MED ORDER — ACCU-CHEK SOFTCLIX LANCETS MISC: 1 refills | Status: DC

## 2022-11-16 MED ORDER — ONETOUCH ULTRA 2 W/DEVICE KIT: PACK | 0 refills | Status: DC

## 2022-11-18 ENCOUNTER — Encounter: Payer: Self-pay | Admitting: Family Medicine

## 2022-11-18 MED ORDER — ONETOUCH ULTRA VI LIQD: 1.0000 [drp] | 3 refills | Status: DC

## 2022-12-05 DIAGNOSIS — E66811 Obesity, class 1: Secondary | ICD-10-CM | POA: Insufficient documentation

## 2022-12-05 NOTE — Assessment & Plan Note (Signed)
Stable on current meds. Continues to struggle

## 2022-12-05 NOTE — Assessment & Plan Note (Signed)
Encouraged DASH or MIND diet, decrease po intake and increase exercise as tolerated. Needs 7-8 hours of sleep nightly. Avoid trans fats, eat small, frequent meals every 4-5 hours with lean proteins, complex carbs and healthy fats. Minimize simple carbs, high fat foods and processed foods 

## 2022-12-05 NOTE — Assessment & Plan Note (Signed)
Encourage heart healthy diet such as MIND or DASH diet, increase exercise, avoid trans fats, simple carbohydrates and processed foods, consider a krill or fish or flaxseed oil cap daily.  Patient continues to decline medications.  

## 2022-12-05 NOTE — Assessment & Plan Note (Signed)
hgba1c unacceptable, minimize simple carbs. Increase exercise as tolerated. Continue current meds patient reports she is eating much better without sweets and her sugar was 115 this am but her last HGBA1C was 9.3 so she is still struggling with polyuria.

## 2022-12-06 ENCOUNTER — Telehealth (INDEPENDENT_AMBULATORY_CARE_PROVIDER_SITE_OTHER): Payer: Commercial Managed Care - HMO | Admitting: Family Medicine

## 2022-12-06 ENCOUNTER — Other Ambulatory Visit: Payer: Self-pay | Admitting: Family Medicine

## 2022-12-06 ENCOUNTER — Encounter: Payer: Self-pay | Admitting: Family Medicine

## 2022-12-06 DIAGNOSIS — E782 Mixed hyperlipidemia: Secondary | ICD-10-CM

## 2022-12-06 DIAGNOSIS — R3589 Other polyuria: Secondary | ICD-10-CM

## 2022-12-06 DIAGNOSIS — F418 Other specified anxiety disorders: Secondary | ICD-10-CM

## 2022-12-06 DIAGNOSIS — E669 Obesity, unspecified: Secondary | ICD-10-CM

## 2022-12-06 DIAGNOSIS — Z79899 Other long term (current) drug therapy: Secondary | ICD-10-CM

## 2022-12-06 DIAGNOSIS — E1169 Type 2 diabetes mellitus with other specified complication: Secondary | ICD-10-CM | POA: Diagnosis not present

## 2022-12-06 NOTE — Assessment & Plan Note (Signed)
Is requesting a note for work that allows her to have a break to urinate every 2-3 hours and she is going to contact her HR department to clarify how they need her accommodations documented

## 2022-12-06 NOTE — Telephone Encounter (Signed)
Requesting: alprazolam 0.5mg   Contract: 06/09/21 UDS: 12/10/21 Last Visit: 11/05/22 w/ Efraim Kaufmann Next Visit: 02/14/23 Last Refill: 11/05/22 #120 and 0RF   Please Advise

## 2022-12-06 NOTE — Progress Notes (Signed)
MyChart Video Visit    Virtual Visit via Video Note   This visit type was conducted due to national recommendations for restrictions regarding the COVID-19 Pandemic (e.g. social distancing) in an effort to limit this patient's exposure and mitigate transmission in our community. This patient is at least at moderate risk for complications without adequate follow up. This format is felt to be most appropriate for this patient at this time. Physical exam was limited by quality of the video and audio technology used for the visit. Shamaine, CMA was able to get the patient set up on a video visit.  Patient location: home Patient and provider in visit Provider location: Office  I discussed the limitations of evaluation and management by telemedicine and the availability of in person appointments. The patient expressed understanding and agreed to proceed.  Visit Date: 12/06/2022  Today's healthcare provider: Danise Edge, MD     Subjective:    Patient ID: Ashley Jackson, female    DOB: Jan 15, 1961, 62 y.o.   MRN: [id number]  No chief complaint on file.   HPI Patient is in today for follow up on chronic medical concerns. No recent febrile illness or hospitalizations. Denies CP/palp/SOB/HA/congestion/fevers/GI or GU c/o. Taking meds as prescribed but is struggling with stress at work. She works for [employer] and they will not let her take a break to use the restroom as often as she needs to and is hoping to get an accommodation. No dysuria or hematuria. Since her last hgba1c in March she is eating better, less sweets. Her sugar this am was 115.  Past Medical History:  Diagnosis Date   Allergic state 01/24/2017   Anxiety state, unspecified 07/12/2007   COLONIC POLYPS, HX OF 05/02/2007   DEPRESSION 07/12/2007   DIABETES MELLITUS, TYPE II, UNCONTROLLED 02/25/2010   EXOGENOUS OBESITY 11/07/2007   Hematuria 06/03/2017   HYPERGLYCEMIA 07/12/2007   HYPERLIPIDEMIA 02/07/2008   Low back pain  04/29/2013   Overweight(278.02) 06/24/2010   Palpitations 01/20/2016   Preventative health care 04/29/2013   Tobacco abuse 10/24/2012   TOBACCO USER 06/24/2010    Past Surgical History:  Procedure Laterality Date   COLONOSCOPY  2006    Family History  Problem Relation Age of Onset   Liver cancer Mother    Hypertension Other    Hypertension Father    Stroke Brother    Seizures Son    Diabetes Maternal Uncle    Breast cancer Neg Hx    Prostate cancer Neg Hx    Heart disease Neg Hx     Social History   Socioeconomic History   Marital status: Divorced    Spouse name: Not on file   Number of children: Not on file   Years of education: Not on file   Highest education level: Not on file  Occupational History   Not on file  Tobacco Use   Smoking status: Every Day    Packs/day: 1    Types: Cigarettes   Smokeless tobacco: Never  Vaping Use   Vaping Use: Never used  Substance and Sexual Activity   Alcohol use: Not Currently   Drug use: No   Sexual activity: Not Currently  Other Topics Concern   Not on file  Social History Narrative   Not on file   Social Determinants of Health   Financial Resource Strain: Not on file  Food Insecurity: Not on file  Transportation Needs: Not on file  Physical Activity: Not on file  Stress: Not  on file  Social Connections: Not on file  Intimate Partner Violence: Not on file    Outpatient Medications Prior to Visit  Medication Sig Dispense Refill   Blood Glucose Calibration (ONETOUCH ULTRA) LIQD 1 drop by Does not apply route every 30 (thirty) days. To check One Touch Ultra 2 calibration 1 each 3   Blood Glucose Monitoring Suppl (ONE TOUCH ULTRA 2) w/Device KIT Check blood sugar once daily. Dx. Code E11.69 1 kit 0   glucose blood (ONETOUCH ULTRA) test strip Check blood sugar once daily. Dx. Code E11.69 100 each 1   OneTouch Delica Lancets 33G MISC Check blood sugar once daily. Dx. Code E11.69 100 each 1   sertraline (ZOLOFT) 100 MG tablet  TAKE 1 TABLET BY MOUTH EVERY DAY 90 tablet 1   No facility-administered medications prior to visit.    Allergies  Allergen Reactions   Atarax [Hydroxyzine] Shortness Of Breath    Anxiety, shortness of breath, tachycardia    Cortisone     HEART RACING   Levofloxacin     anxiety   Sulfa Antibiotics     Panic attacks   Sulfonamide Derivatives     REACTION: Panic attacks   Levofloxacin Anxiety    Review of Systems  Constitutional:  Negative for fever and malaise/fatigue.  HENT:  Negative for congestion.   Eyes:  Negative for blurred vision.  Respiratory:  Negative for shortness of breath.   Cardiovascular:  Negative for chest pain, palpitations and leg swelling.  Gastrointestinal:  Negative for abdominal pain, blood in stool and nausea.  Genitourinary:  Positive for frequency and urgency. Negative for dysuria and hematuria.  Musculoskeletal:  Negative for falls.  Skin:  Negative for rash.  Neurological:  Negative for dizziness, loss of consciousness and headaches.  Endo/Heme/Allergies:  Negative for environmental allergies.  Psychiatric/Behavioral:  Negative for depression. The patient is not nervous/anxious.        Objective:    Physical Exam Constitutional:      General: She is not in acute distress.    Appearance: Normal appearance. She is not ill-appearing or toxic-appearing.  HENT:     Head: Normocephalic and atraumatic.     Right Ear: External ear normal.     Left Ear: External ear normal.     Nose: Nose normal.  Eyes:     General:        Right eye: No discharge.        Left eye: No discharge.  Pulmonary:     Effort: Pulmonary effort is normal.  Skin:    Findings: No rash.  Neurological:     Mental Status: She is alert and oriented to person, place, and time.  Psychiatric:        Behavior: Behavior normal.     LMP 12/09/2015  Wt Readings from Last 3 Encounters:  11/05/22 170 lb (77.1 kg)  07/01/22 171 lb 12.8 oz (77.9 kg)  06/09/21 177 lb 6.4 oz  (80.5 kg)       Assessment & Plan:  Depression with anxiety Assessment & Plan: Stable on current meds. Continues to struggle    Type 2 diabetes mellitus with obesity Assessment & Plan: hgba1c unacceptable, minimize simple carbs. Increase exercise as tolerated. Continue current meds    Hyperlipidemia, mixed Assessment & Plan: Encourage heart healthy diet such as MIND or DASH diet, increase exercise, avoid trans fats, simple carbohydrates and processed foods, consider a krill or fish or flaxseed oil cap daily.  Patient continues to decline medications.  Morbid obesity Assessment & Plan: Encouraged DASH or MIND diet, decrease po intake and increase exercise as tolerated. Needs 7-8 hours of sleep nightly. Avoid trans fats, eat small, frequent meals every 4-5 hours with lean proteins, complex carbs and healthy fats. Minimize simple carbs, high fat foods and processed foods       I discussed the assessment and treatment plan with the patient. The patient was provided an opportunity to ask questions and all were answered. The patient agreed with the plan and demonstrated an understanding of the instructions.   The patient was advised to call back or seek an in-person evaluation if the symptoms worsen or if the condition fails to improve as anticipated.  Danise Edge, MD [facility] [phone] (phone) [phone] (fax)  [facility]

## 2022-12-07 ENCOUNTER — Encounter: Payer: Self-pay | Admitting: Family Medicine

## 2022-12-17 ENCOUNTER — Encounter: Payer: Self-pay | Admitting: Family Medicine

## 2022-12-23 ENCOUNTER — Telehealth: Payer: Self-pay

## 2022-12-23 NOTE — Telephone Encounter (Signed)
Pt called back stating that she wanted the paperwork faxed to the number on the paperwork.

## 2022-12-23 NOTE — Telephone Encounter (Signed)
Called pt Lvm her forms are ready for pickup And will be up front in bin.To let us know if we need to  Fax copy over.

## 2022-12-23 NOTE — Telephone Encounter (Signed)
Paperwork was faxed 

## 2022-12-24 ENCOUNTER — Encounter: Payer: Self-pay | Admitting: Family Medicine

## 2023-01-04 ENCOUNTER — Other Ambulatory Visit: Payer: Self-pay | Admitting: Family Medicine

## 2023-01-18 ENCOUNTER — Encounter: Payer: Self-pay | Admitting: Family Medicine

## 2023-01-18 DIAGNOSIS — Z79899 Other long term (current) drug therapy: Secondary | ICD-10-CM

## 2023-01-18 NOTE — Telephone Encounter (Signed)
Requesting: alprazolam 0.5mg   Contract: 06/09/21 UDS: 12/10/21 Last Visit: 12/06/22 Next Visit: 02/14/23 Last Refill: 12/06/22 #120 and 0RF   Please Advise

## 2023-01-19 ENCOUNTER — Other Ambulatory Visit: Payer: Self-pay | Admitting: Family Medicine

## 2023-01-19 DIAGNOSIS — Z79899 Other long term (current) drug therapy: Secondary | ICD-10-CM

## 2023-01-19 MED ORDER — ALPRAZOLAM 0.5 MG PO TABS: 0.5000 mg | ORAL_TABLET | Freq: Four times a day (QID) | ORAL | 1 refills | Status: DC | PRN

## 2023-02-04 ENCOUNTER — Other Ambulatory Visit (INDEPENDENT_AMBULATORY_CARE_PROVIDER_SITE_OTHER): Payer: Commercial Managed Care - HMO

## 2023-02-04 DIAGNOSIS — E1169 Type 2 diabetes mellitus with other specified complication: Secondary | ICD-10-CM | POA: Diagnosis not present

## 2023-02-04 DIAGNOSIS — E669 Obesity, unspecified: Secondary | ICD-10-CM | POA: Diagnosis not present

## 2023-02-04 LAB — HEMOGLOBIN A1C: Hgb A1c MFr Bld: 7.4 % — ABNORMAL HIGH (ref 4.6–6.5)

## 2023-02-13 NOTE — Assessment & Plan Note (Signed)
Encourage heart healthy diet such as MIND or DASH diet, increase exercise, avoid trans fats, simple carbohydrates and processed foods, consider a krill or fish or flaxseed oil cap daily.  Patient continues to decline medications.  

## 2023-02-13 NOTE — Assessment & Plan Note (Signed)
Still stressed at work but stable on Sertraline and Alprazolam prn

## 2023-02-13 NOTE — Assessment & Plan Note (Addendum)
hgba1c acceptable at last check at 7.4in June 2024.  minimize simple carbs. Increase exercise as tolerated. Continue current meds

## 2023-02-13 NOTE — Progress Notes (Unsigned)
Subjective:    Patient ID: Ashley Jackson, female    DOB: 02/25/1961, 62 y.o.   MRN: [id number]  No chief complaint on file.   HPI Discussed the use of AI scribe software for clinical note transcription with the patient, who gave verbal consent to proceed.  History of Present Illness  Patient is a 62 yo female in today for follow up on chronic medical concerns. No recent febrile illness or hospitalizations. Denies CP/palp/SOB/HA/congestion/fevers/GI or GU c/o. Taking meds as prescribed           Past Medical History:  Diagnosis Date  . Allergic state 01/24/2017  . Anxiety state, unspecified 07/12/2007  . COLONIC POLYPS, HX OF 05/02/2007  . DEPRESSION 07/12/2007  . DIABETES MELLITUS, TYPE II, UNCONTROLLED 02/25/2010  . EXOGENOUS OBESITY 11/07/2007  . Hematuria 06/03/2017  . HYPERGLYCEMIA 07/12/2007  . HYPERLIPIDEMIA 02/07/2008  . Low back pain 04/29/2013  . Overweight(278.02) 06/24/2010  . Palpitations 01/20/2016  . Preventative health care 04/29/2013  . Tobacco abuse 10/24/2012  . TOBACCO USER 06/24/2010    Past Surgical History:  Procedure Laterality Date  . COLONOSCOPY  2006    Family History  Problem Relation Age of Onset  . Liver cancer Mother   . Hypertension Other   . Hypertension Father   . Stroke Brother   . Seizures Son   . Diabetes Maternal Uncle   . Breast cancer Neg Hx   . Prostate cancer Neg Hx   . Heart disease Neg Hx     Social History   Socioeconomic History  . Marital status: Divorced    Spouse name: Not on file  . Number of children: Not on file  . Years of education: Not on file  . Highest education level: Associate degree: academic program  Occupational History  . Not on file  Tobacco Use  . Smoking status: Every Day    Packs/day: 1    Types: Cigarettes  . Smokeless tobacco: Never  Vaping Use  . Vaping Use: Never used  Substance and Sexual Activity  . Alcohol use: Not Currently  . Drug use: No  . Sexual activity: Not Currently  Other Topics  Concern  . Not on file  Social History Narrative  . Not on file   Social Determinants of Health   Financial Resource Strain: Low Risk  (02/13/2023)   Overall Financial Resource Strain (CARDIA)   . Difficulty of Paying Living Expenses: Not very hard  Food Insecurity: No Food Insecurity (02/13/2023)   Hunger Vital Sign   . Worried About Programme researcher, broadcasting/film/video in the Last Year: Never true   . Ran Out of Food in the Last Year: Never true  Transportation Needs: No Transportation Needs (02/13/2023)   PRAPARE - Transportation   . Lack of Transportation (Medical): No   . Lack of Transportation (Non-Medical): No  Physical Activity: Insufficiently Active (02/13/2023)   Exercise Vital Sign   . Days of Exercise per Week: 1 day   . Minutes of Exercise per Session: 20 min  Stress: Stress Concern Present (02/13/2023)   [employer] of Occupational Health - Occupational Stress Questionnaire   . Feeling of Stress : Rather much  Social Connections: Socially Isolated (02/13/2023)   Social Connection and Isolation Panel [NHANES]   . Frequency of Communication with Friends and Family: More than three times a week   . Frequency of Social Gatherings with Friends and Family: Twice a week   . Attends Religious Services: Never   .  Active Member of Clubs or Organizations: No   . Attends Banker Meetings: Not on file   . Marital Status: Divorced  Catering manager Violence: Not on file    Outpatient Medications Prior to Visit  Medication Sig Dispense Refill  . ALPRAZolam (XANAX) 0.5 MG tablet Take 1 tablet (0.5 mg total) by mouth 4 (four) times daily as needed for anxiety. 120 tablet 1  . Blood Glucose Calibration (ONETOUCH ULTRA) LIQD 1 drop by Does not apply route every 30 (thirty) days. To check One Touch Ultra 2 calibration 1 each 3  . Blood Glucose Monitoring Suppl (ONE TOUCH ULTRA 2) w/Device KIT Check blood sugar once daily. Dx. Code E11.69 1 kit 0  . glucose blood (ONETOUCH ULTRA) test  strip Check blood sugar once daily. Dx. Code E11.69 100 each 1  . OneTouch Delica Lancets 33G MISC Check blood sugar once daily. Dx. Code E11.69 100 each 1  . sertraline (ZOLOFT) 100 MG tablet Take 1 tablet (100 mg total) by mouth daily. 90 tablet 0   No facility-administered medications prior to visit.    Allergies  Allergen Reactions  . Atarax [Hydroxyzine] Shortness Of Breath    Anxiety, shortness of breath, tachycardia   . Cortisone     HEART RACING  . Levofloxacin     anxiety  . Sulfa Antibiotics     Panic attacks  . Sulfonamide Derivatives     REACTION: Panic attacks  . Levofloxacin Anxiety    Review of Systems  Constitutional:  Positive for malaise/fatigue. Negative for fever.  HENT:  Negative for congestion.   Eyes:  Negative for blurred vision.  Respiratory:  Negative for shortness of breath.   Cardiovascular:  Negative for chest pain, palpitations and leg swelling.  Gastrointestinal:  Negative for abdominal pain, blood in stool and nausea.  Genitourinary:  Negative for dysuria and frequency.  Musculoskeletal:  Negative for falls.  Skin:  Negative for rash.  Neurological:  Negative for dizziness, loss of consciousness and headaches.  Endo/Heme/Allergies:  Negative for environmental allergies.  Psychiatric/Behavioral:  Negative for depression. The patient is nervous/anxious.       Objective:    Physical Exam Constitutional:      General: She is not in acute distress.    Appearance: Normal appearance. She is well-developed. She is not toxic-appearing.  HENT:     Head: Normocephalic and atraumatic.     Right Ear: External ear normal.     Left Ear: External ear normal.     Nose: Nose normal.  Eyes:     General:        Right eye: No discharge.        Left eye: No discharge.     Conjunctiva/sclera: Conjunctivae normal.  Neck:     Thyroid: No thyromegaly.  Cardiovascular:     Rate and Rhythm: Normal rate and regular rhythm.     Heart sounds: Normal heart  sounds. No murmur heard. Pulmonary:     Effort: Pulmonary effort is normal. No respiratory distress.     Breath sounds: Normal breath sounds.  Abdominal:     General: Bowel sounds are normal.     Palpations: Abdomen is soft.     Tenderness: There is no abdominal tenderness. There is no guarding.  Musculoskeletal:        General: Normal range of motion.     Cervical back: Neck supple.  Lymphadenopathy:     Cervical: No cervical adenopathy.  Skin:    General: Skin  is warm and dry.  Neurological:     Mental Status: She is alert and oriented to person, place, and time.  Psychiatric:        Mood and Affect: Mood normal.        Behavior: Behavior normal.        Thought Content: Thought content normal.        Judgment: Judgment normal.   LMP 12/09/2015  Wt Readings from Last 3 Encounters:  11/05/22 170 lb (77.1 kg)  07/01/22 171 lb 12.8 oz (77.9 kg)  06/09/21 177 lb 6.4 oz (80.5 kg)    Diabetic Foot Exam - Simple   No data filed    Lab Results  Component Value Date   WBC 7.1 12/10/2021   HGB 15.0 12/10/2021   HCT 45.2 12/10/2021   PLT 263.0 12/10/2021   GLUCOSE 266 (H) 10/29/2022   CHOL 194 10/29/2022   TRIG 184.0 (H) 10/29/2022   HDL 56.00 10/29/2022   LDLDIRECT 126.0 03/25/2022   LDLCALC 101 (H) 10/29/2022   ALT 22 10/29/2022   AST 18 10/29/2022   NA 137 10/29/2022   K 4.1 10/29/2022   CL 99 10/29/2022   CREATININE 0.96 10/29/2022   BUN 11 10/29/2022   CO2 28 10/29/2022   TSH 1.39 12/10/2021   HGBA1C 7.4 (H) 02/04/2023   MICROALBUR 0.72 10/20/2012    Lab Results  Component Value Date   TSH 1.39 12/10/2021   Lab Results  Component Value Date   WBC 7.1 12/10/2021   HGB 15.0 12/10/2021   HCT 45.2 12/10/2021   MCV 91.8 12/10/2021   PLT 263.0 12/10/2021   Lab Results  Component Value Date   NA 137 10/29/2022   K 4.1 10/29/2022   CO2 28 10/29/2022   GLUCOSE 266 (H) 10/29/2022   BUN 11 10/29/2022   CREATININE 0.96 10/29/2022   BILITOT 0.6 10/29/2022    ALKPHOS 58 10/29/2022   AST 18 10/29/2022   ALT 22 10/29/2022   PROT 7.1 10/29/2022   ALBUMIN 4.0 10/29/2022   CALCIUM 9.3 10/29/2022   GFR 63.88 10/29/2022   Lab Results  Component Value Date   CHOL 194 10/29/2022   Lab Results  Component Value Date   HDL 56.00 10/29/2022   Lab Results  Component Value Date   LDLCALC 101 (H) 10/29/2022   Lab Results  Component Value Date   TRIG 184.0 (H) 10/29/2022   Lab Results  Component Value Date   CHOLHDL 3 10/29/2022   Lab Results  Component Value Date   HGBA1C 7.4 (H) 02/04/2023       Assessment & Plan:  TOBACCO USER Assessment & Plan: Encouraged complete cessation. Discussed need to quit as relates to risk of numerous cancers, cardiac and pulmonary disease as well as neurologic complications. Counseled for greater than 3 minutes    Hyperlipidemia, mixed Assessment & Plan: Encourage heart healthy diet such as MIND or DASH diet, increase exercise, avoid trans fats, simple carbohydrates and processed foods, consider a krill or fish or flaxseed oil cap daily.  Patient continues to decline medications.    Type 2 diabetes mellitus with obesity (HCC) Assessment & Plan: hgba1c acceptable at last check at 7.4, minimize simple carbs. Increase exercise as tolerated. Continue current meds    Depression with anxiety Assessment & Plan: Still stressed at work but stable on Sertraline and Alprazolam prn     Assessment and Plan              Danise Edge,  MD

## 2023-02-13 NOTE — Assessment & Plan Note (Signed)
Encouraged complete cessation. Discussed need to quit as relates to risk of numerous cancers, cardiac and pulmonary disease as well as neurologic complications. Counseled for greater than 3 minutes 

## 2023-02-14 ENCOUNTER — Ambulatory Visit (INDEPENDENT_AMBULATORY_CARE_PROVIDER_SITE_OTHER): Payer: Commercial Managed Care - HMO | Admitting: Family Medicine

## 2023-02-14 ENCOUNTER — Other Ambulatory Visit: Payer: Self-pay

## 2023-02-14 VITALS — BP 132/80 | HR 77 | Temp 98.0°F | Resp 16 | Ht 63.0 in | Wt 165.8 lb

## 2023-02-14 DIAGNOSIS — Z79899 Other long term (current) drug therapy: Secondary | ICD-10-CM

## 2023-02-14 DIAGNOSIS — E782 Mixed hyperlipidemia: Secondary | ICD-10-CM

## 2023-02-14 DIAGNOSIS — F17200 Nicotine dependence, unspecified, uncomplicated: Secondary | ICD-10-CM

## 2023-02-14 DIAGNOSIS — F418 Other specified anxiety disorders: Secondary | ICD-10-CM

## 2023-02-14 DIAGNOSIS — E669 Obesity, unspecified: Secondary | ICD-10-CM

## 2023-02-14 DIAGNOSIS — E1169 Type 2 diabetes mellitus with other specified complication: Secondary | ICD-10-CM

## 2023-02-14 NOTE — Patient Instructions (Signed)
Tobacco Use Disorder Tobacco use disorder (TUD) occurs when a person craves, seeks, and uses tobacco, regardless of the consequences. This disorder can cause problems with mental and physical health. It can affect your ability to have healthy relationships. It can also keep you from meeting your responsibilities at work, home, or school. Tobacco products contain a dangerous chemical called nicotine. Nicotine triggers hormones that make the body feel stimulated and works on areas of the brain that make a person feel good. These effects can make the person depend on nicotine, which makes it hard to quit tobacco. Tobacco may be: Smoked as a cigarette or cigar. Inhaled using vaping devices, such as e-cigarettes. Smoked in a pipe or hookah. Chewed as smokeless tobacco. Inhaled into the nostrils as snuff. What are the causes? This condition is caused by using nicotine. Nicotine causes changes in your brain that make you want more and more. This is called addiction. This can make it hard to stop using tobacco once you start. What are the signs or symptoms? Symptoms of TUD may include: Being unable to stop your tobacco use even after planned quit attempts. Spending an abnormal amount of time getting or using tobacco. Craving tobacco. Tobacco use that: Interferes with your work, school, or home life. Interferes with your personal and social relationships. Makes you give up activities that you once enjoyed or found important. Using tobacco even though you know that it is: Dangerous or bad for your health or someone else's health. Causing problems in your life. Needing more and more of the substance to get the same effect (developing tolerance). Using the substance to avoid unpleasant symptoms if you do not use the substance (withdrawal). How is this diagnosed? This condition may be diagnosed based on: Your current and past tobacco use. Your health care provider may ask questions about how your  tobacco use affects your life. A physical exam. You may be diagnosed with TUD if you have at least two symptoms within a 12-month period. How is this treated? This condition is treated by stopping tobacco use. Many people are unable to quit on their own and need help. Treatment may include: Nicotine replacement therapy (NRT). NRT provides nicotine without the other harmful chemicals in tobacco. NRT gradually lowers the dosage of nicotine in the body and reduces withdrawal symptoms. NRT is available as: Over-the-counter gums, lozenges, and skin patches. Prescription mouth inhalers and nasal sprays. Medicine that acts on the brain to reduce cravings and withdrawal symptoms. A type of talk therapy that examines your triggers for tobacco use, how to avoid them, and how to cope with cravings (behavioral therapy). Hypnosis. This may help with withdrawal symptoms. Joining a support group for people coping with TUD. The best treatment for TUD is usually a combination of medicine, talk therapy, and support groups. Recovery can be a long process. Many people start using tobacco again after stopping (relapse). If you relapse, it does not mean that treatment will not work. Follow these instructions at home:  Lifestyle Do not use any products that contain nicotine or tobacco. These products include cigarettes, chewing tobacco, and vaping devices, such as e-cigarettes. If you need help quitting, ask your health care provider. Avoid things that trigger tobacco use as much as you can. Triggers include people and situations that usually cause you to use tobacco. Avoid drinks that contain caffeine, including coffee. These may worsen some withdrawal symptoms. Find ways to manage stress. Wanting to smoke may cause stress, and stress can make you want   to smoke. Relaxation techniques such as deep breathing, meditation, and yoga may help. Attend support groups as needed. These groups are an important part of long-term  recovery for many people. General instructions Take over-the-counter and prescription medicines only as told by your health care provider. Check with your health care provider before taking any new prescription or over-the-counter medicines. Decide on a friend, family member, or smoking quit-line (such as 1-800-QUIT-NOW in the [place] that you can call or text when you feel the urge to smoke or when you need help coping with cravings. Keep all follow-up visits. This is important. Contact a health care provider if: You are not able to take your medicines as told by your health care provider. Your symptoms get worse, even with treatment. Summary Tobacco use disorder (TUD) occurs when a person craves, seeks, and uses tobacco regardless of the consequences. This condition may be diagnosed based on your current and past tobacco use and a physical exam. Many people are unable to quit on their own and need help. Recovery can be a long process. The most effective treatment for TUD is usually a combination of medicine, talk therapy, and support groups. This information is not intended to replace advice given to you by your health care provider. Make sure you discuss any questions you have with your health care provider. Document Revised: 08/04/2021 Document Reviewed: 08/04/2021 Elsevier Patient Education  2024 [employer]  

## 2023-02-18 ENCOUNTER — Other Ambulatory Visit: Payer: Self-pay | Admitting: Family Medicine

## 2023-02-24 ENCOUNTER — Other Ambulatory Visit: Payer: Self-pay | Admitting: Family Medicine

## 2023-03-22 ENCOUNTER — Other Ambulatory Visit: Payer: Self-pay | Admitting: Family Medicine

## 2023-03-22 DIAGNOSIS — Z79899 Other long term (current) drug therapy: Secondary | ICD-10-CM

## 2023-04-09 ENCOUNTER — Other Ambulatory Visit: Payer: Self-pay | Admitting: Family Medicine

## 2023-05-20 ENCOUNTER — Other Ambulatory Visit: Payer: Self-pay | Admitting: Family Medicine

## 2023-05-20 DIAGNOSIS — Z79899 Other long term (current) drug therapy: Secondary | ICD-10-CM

## 2023-05-23 ENCOUNTER — Encounter: Payer: Self-pay | Admitting: Family Medicine

## 2023-05-23 NOTE — Telephone Encounter (Signed)
Requesting: alprazolam 0.5mg  Contract: 03/08/23 UDS: 12/10/21 Last Visit: 02/14/23 Next Visit: 07/07/23 Last Refill: 03/22/23 #120 and 1RF  Please Advise

## 2023-06-14 ENCOUNTER — Encounter: Payer: Self-pay | Admitting: Family Medicine

## 2023-06-22 ENCOUNTER — Other Ambulatory Visit: Payer: Self-pay | Admitting: Family Medicine

## 2023-07-01 ENCOUNTER — Other Ambulatory Visit (INDEPENDENT_AMBULATORY_CARE_PROVIDER_SITE_OTHER): Payer: Managed Care, Other (non HMO)

## 2023-07-01 DIAGNOSIS — E669 Obesity, unspecified: Secondary | ICD-10-CM | POA: Diagnosis not present

## 2023-07-01 DIAGNOSIS — E1169 Type 2 diabetes mellitus with other specified complication: Secondary | ICD-10-CM

## 2023-07-01 DIAGNOSIS — E782 Mixed hyperlipidemia: Secondary | ICD-10-CM

## 2023-07-01 DIAGNOSIS — F17200 Nicotine dependence, unspecified, uncomplicated: Secondary | ICD-10-CM

## 2023-07-01 LAB — COMPREHENSIVE METABOLIC PANEL
ALT: 13 U/L (ref 0–35)
AST: 15 U/L (ref 0–37)
Albumin: 4.2 g/dL (ref 3.5–5.2)
Alkaline Phosphatase: 60 U/L (ref 39–117)
BUN: 16 mg/dL (ref 6–23)
CO2: 29 meq/L (ref 19–32)
Calcium: 9.1 mg/dL (ref 8.4–10.5)
Chloride: 104 meq/L (ref 96–112)
Creatinine, Ser: 0.93 mg/dL (ref 0.40–1.20)
GFR: 66.05 mL/min (ref >60.00)
Glucose, Bld: 180 mg/dL — ABNORMAL HIGH (ref 70–99)
Potassium: 4.4 meq/L (ref 3.5–5.1)
Sodium: 140 meq/L (ref 135–145)
Total Bilirubin: 0.4 mg/dL (ref 0.2–1.2)
Total Protein: 7 g/dL (ref 6.0–8.3)

## 2023-07-01 LAB — CBC WITH DIFFERENTIAL/PLATELET
Basophils Absolute: 0 10*3/uL (ref 0.0–0.1)
Basophils Relative: 0.3 % (ref 0.0–3.0)
Eosinophils Absolute: 0.1 10*3/uL (ref 0.0–0.7)
Eosinophils Relative: 1.1 % (ref 0.0–5.0)
HCT: 44.6 % (ref 36.0–46.0)
Hemoglobin: 14.9 g/dL (ref 12.0–15.0)
Lymphocytes Relative: 31 % (ref 12.0–46.0)
Lymphs Abs: 2.3 10*3/uL (ref 0.7–4.0)
MCHC: 33.3 g/dL (ref 30.0–36.0)
MCV: 92.8 fL (ref 78.0–100.0)
Monocytes Absolute: 0.5 10*3/uL (ref 0.1–1.0)
Monocytes Relative: 6.2 % (ref 3.0–12.0)
Neutro Abs: 4.6 10*3/uL (ref 1.4–7.7)
Neutrophils Relative %: 61.4 % (ref 43.0–77.0)
Platelets: 270 10*3/uL (ref 150.0–400.0)
RBC: 4.81 Mil/uL (ref 3.87–5.11)
RDW: 12.5 % (ref 11.5–15.5)
WBC: 7.6 10*3/uL (ref 4.0–10.5)

## 2023-07-01 LAB — LIPID PANEL
Cholesterol: 219 mg/dL — ABNORMAL HIGH (ref 0–200)
HDL: 58.4 mg/dL (ref >39.00)
LDL Cholesterol: 129 mg/dL — ABNORMAL HIGH (ref 0–99)
NonHDL: 161.02
Total CHOL/HDL Ratio: 4
Triglycerides: 159 mg/dL — ABNORMAL HIGH (ref 0.0–149.0)
VLDL: 31.8 mg/dL (ref 0.0–40.0)

## 2023-07-01 LAB — HEMOGLOBIN A1C: Hgb A1c MFr Bld: 8 % — ABNORMAL HIGH (ref 4.6–6.5)

## 2023-07-01 LAB — TSH: TSH: 1.98 u[IU]/mL (ref 0.35–5.50)

## 2023-07-07 ENCOUNTER — Encounter: Payer: Commercial Managed Care - HMO | Admitting: Family Medicine

## 2023-07-25 ENCOUNTER — Other Ambulatory Visit: Payer: Self-pay | Admitting: Family

## 2023-07-25 DIAGNOSIS — Z79899 Other long term (current) drug therapy: Secondary | ICD-10-CM

## 2023-07-27 ENCOUNTER — Encounter: Payer: Self-pay | Admitting: Family Medicine

## 2023-07-28 NOTE — Telephone Encounter (Signed)
Requesting: Alprazalam 0.5 mg Contract: 12/10/2021 UDS: 12/10/2021 Last Visit: 02/14/2023 Next Visit: 08/10/2023 w / Peggyann Juba Last Refill:05/23/2023  Please Advise

## 2023-07-29 ENCOUNTER — Other Ambulatory Visit: Payer: Self-pay | Admitting: Family

## 2023-07-29 DIAGNOSIS — Z79899 Other long term (current) drug therapy: Secondary | ICD-10-CM

## 2023-07-29 MED ORDER — ALPRAZOLAM 0.5 MG PO TABS: 0.5000 mg | ORAL_TABLET | Freq: Four times a day (QID) | ORAL | 1 refills | Status: DC | PRN

## 2023-08-05 ENCOUNTER — Encounter: Payer: Managed Care, Other (non HMO) | Admitting: Family

## 2023-08-10 ENCOUNTER — Encounter: Payer: Self-pay | Admitting: Family

## 2023-08-10 ENCOUNTER — Ambulatory Visit (INDEPENDENT_AMBULATORY_CARE_PROVIDER_SITE_OTHER): Payer: Commercial Managed Care - HMO | Admitting: Family

## 2023-08-10 VITALS — BP 130/67 | HR 67 | Temp 97.7°F | Resp 16 | Ht 63.0 in | Wt 166.0 lb

## 2023-08-10 DIAGNOSIS — E782 Mixed hyperlipidemia: Secondary | ICD-10-CM

## 2023-08-10 DIAGNOSIS — E669 Obesity, unspecified: Secondary | ICD-10-CM

## 2023-08-10 DIAGNOSIS — R319 Hematuria, unspecified: Secondary | ICD-10-CM | POA: Diagnosis not present

## 2023-08-10 DIAGNOSIS — Z860100 Personal history of colon polyps, unspecified: Secondary | ICD-10-CM | POA: Diagnosis not present

## 2023-08-10 DIAGNOSIS — E1169 Type 2 diabetes mellitus with other specified complication: Secondary | ICD-10-CM

## 2023-08-10 DIAGNOSIS — F418 Other specified anxiety disorders: Secondary | ICD-10-CM

## 2023-08-10 DIAGNOSIS — F17200 Nicotine dependence, unspecified, uncomplicated: Secondary | ICD-10-CM

## 2023-08-10 DIAGNOSIS — Z79899 Other long term (current) drug therapy: Secondary | ICD-10-CM

## 2023-08-10 MED ORDER — ALPRAZOLAM 0.5 MG PO TABS: 0.5000 mg | ORAL_TABLET | Freq: Four times a day (QID) | ORAL | 0 refills | Status: DC | PRN

## 2023-08-10 NOTE — Assessment & Plan Note (Signed)
Declines further colon cancer screening  

## 2023-08-10 NOTE — Progress Notes (Signed)
Subjective:     Patient ID: Ashley Jackson, female    DOB: Jul 11, 1961, 62 y.o.   MRN: [id number]  Chief Complaint  Patient presents with   Annual Exam    HPI  Discussed the use of AI scribe software for clinical note transcription with the patient, who gave verbal consent to proceed.  History of Present Illness   The patient, with a history of diabetes, presents for a routine follow-up. She reports that her blood sugars have been 'okay,' but slightly elevated, with a recent HbA1c of 8.0, up from 7.4. She attributes this increase to dietary 'slipping' around the holidays. Despite the elevated HbA1c, she is resistant to starting medication, expressing confidence in her ability to control her blood sugars through lifestyle modifications.  The patient also has a history of smoking, currently consuming half to a pack a day. She expresses no interest in quitting at this time, despite acknowledging the health risks. She has previously quit for a period of four years.  She has a history of polyps and has previously undergone a colonoscopy and operation. However, she is resistant to further colonoscopies and other early detection screenings, including mammogram and pap smear,  expressing a preference to 'whatever comes my way.'  The patient also reports a history of blood in her urine, for which she saw a urologist back in 2018. Despite a cystoscopy, the cause of the hematuria remains unknown.  She is currently taking sertraline and Xanax, the latter of which she uses two to four times a day, depending on her stress levels. She works as a Administrator, arts for [employer], a job she describes as very stressful.          Health Maintenance Due  Topic Date Due   Cervical Cancer Screening (HPV/Pap Cotest)  Never done   Lung Cancer Screening  Never done   Diabetic kidney evaluation - Urine ACR  10/20/2013   Colonoscopy  12/01/2014   OPHTHALMOLOGY EXAM  07/23/2022    Past Medical  History:  Diagnosis Date   Allergic state 01/24/2017   Anxiety state, unspecified 07/12/2007   COLONIC POLYPS, HX OF 05/02/2007   DEPRESSION 07/12/2007   DIABETES MELLITUS, TYPE II, UNCONTROLLED 02/25/2010   EXOGENOUS OBESITY 11/07/2007   Hematuria 06/03/2017   HYPERGLYCEMIA 07/12/2007   HYPERLIPIDEMIA 02/07/2008   Low back pain 04/29/2013   Overweight(278.02) 06/24/2010   Palpitations 01/20/2016   Preventative health care 04/29/2013   Tobacco abuse 10/24/2012   TOBACCO USER 06/24/2010    Past Surgical History:  Procedure Laterality Date   COLONOSCOPY  2006    Family History  Problem Relation Age of Onset   Liver cancer Mother    Hypertension Other    Hypertension Father    Stroke Brother    Seizures Son    Diabetes Maternal Uncle    Breast cancer Neg Hx    Prostate cancer Neg Hx    Heart disease Neg Hx     Social History   Socioeconomic History   Marital status: Divorced    Spouse name: Not on file   Number of children: Not on file   Years of education: Not on file   Highest education level: Associate degree: academic program  Occupational History   Not on file  Tobacco Use   Smoking status: Every Day    Current packs/day: 1.00    Types: Cigarettes   Smokeless tobacco: Never  Vaping Use   Vaping status: Never Used  Substance and  Sexual Activity   Alcohol use: Not Currently   Drug use: No   Sexual activity: Not Currently  Other Topics Concern   Not on file  Social History Narrative   Not on file   Social Drivers of Health   Financial Resource Strain: Low Risk  (02/13/2023)   Overall Financial Resource Strain (CARDIA)    Difficulty of Paying Living Expenses: Not very hard  Food Insecurity: No Food Insecurity (02/13/2023)   Hunger Vital Sign    Worried About Running Out of Food in the Last Year: Never true    Ran Out of Food in the Last Year: Never true  Transportation Needs: No Transportation Needs (02/13/2023)   PRAPARE - Administrator, Civil Service  (Medical): No    Lack of Transportation (Non-Medical): No  Physical Activity: Insufficiently Active (02/13/2023)   Exercise Vital Sign    Days of Exercise per Week: 1 day    Minutes of Exercise per Session: 20 min  Stress: Stress Concern Present (02/13/2023)   [employer] of Occupational Health - Occupational Stress Questionnaire    Feeling of Stress : Rather much  Social Connections: Socially Isolated (02/13/2023)   Social Connection and Isolation Panel [NHANES]    Frequency of Communication with Friends and Family: More than three times a week    Frequency of Social Gatherings with Friends and Family: Twice a week    Attends Religious Services: Never    Database administrator or Organizations: No    Attends Engineer, structural: Not on file    Marital Status: Divorced  Intimate Partner Violence: Unknown (11/26/2021)   Received from [employer], [facility]   HITS    Physically Hurt: Not on file    Insult or Talk Down To: Not on file    Threaten Physical Harm: Not on file    Scream or Curse: Not on file    Outpatient Medications Prior to Visit  Medication Sig Dispense Refill   Blood Glucose Calibration (ONETOUCH ULTRA CONTROL) LIQD 1 DROP BY DOES NOT APPLY ROUTE EVERY 30 (THIRTY) DAYS. TO CHECK ONE TOUCH ULTRA 2 CALIBRATION 30 each 3   Blood Glucose Monitoring Suppl (ONE TOUCH ULTRA 2) w/Device KIT Check blood sugar once daily. Dx. Code E11.69 1 kit 0   Lancets (ONETOUCH DELICA PLUS LANCET33G) MISC CHECK BLOOD SUGAR ONCE DAILY. DX. CODE E11.69 100 each 12   ONETOUCH ULTRA TEST test strip CHECK BLOOD SUGAR ONCE DAILY. DX. CODE E11.69 50 strip 3   sertraline (ZOLOFT) 100 MG tablet Take 1 tablet (100 mg total) by mouth daily. 90 tablet 1   ALPRAZolam (XANAX) 0.5 MG tablet Take 1 tablet (0.5 mg total) by mouth 4 (four) times daily as needed for anxiety. 120 tablet 1   No facility-administered medications prior to visit.    Allergies  Allergen Reactions   Atarax  [Hydroxyzine] Shortness Of Breath    Anxiety, shortness of breath, tachycardia    Cortisone     HEART RACING   Levofloxacin     anxiety   Sulfa Antibiotics     Panic attacks   Sulfonamide Derivatives     REACTION: Panic attacks   Levofloxacin Anxiety    ROS See HPI    Objective:    Physical Exam Constitutional:      General: She is not in acute distress.    Appearance: Normal appearance. She is well-developed.  HENT:     Head: Normocephalic and atraumatic.  Right Ear: External ear normal.     Left Ear: External ear normal.  Eyes:     General: No scleral icterus. Neck:     Thyroid: No thyromegaly.  Cardiovascular:     Rate and Rhythm: Normal rate and regular rhythm.     Heart sounds: Normal heart sounds. No murmur heard. Pulmonary:     Effort: Pulmonary effort is normal. No respiratory distress.     Breath sounds: Normal breath sounds. No wheezing.  Musculoskeletal:     Cervical back: Neck supple.  Skin:    General: Skin is warm and dry.  Neurological:     Mental Status: She is alert and oriented to person, place, and time.  Psychiatric:        Mood and Affect: Mood normal.        Behavior: Behavior normal.        Thought Content: Thought content normal.        Judgment: Judgment normal.    Diabetic Foot Exam - Simple   Simple Foot Form Diabetic Foot exam was performed with the following findings: Yes 08/10/2023  9:06 AM  Visual Inspection No deformities, no ulcerations, no other skin breakdown bilaterally: Yes Sensation Testing Intact to touch and monofilament testing bilaterally: Yes Pulse Check Posterior Tibialis and Dorsalis pulse intact bilaterally: Yes Comments      BP 130/67 (BP Location: Left Arm, Patient Position: Sitting, Cuff Size: Small)   Pulse 67   Temp 97.7 F (36.5 C) (Oral)   Resp 16   Ht 5\' 3"  (1.6 m)   Wt 166 lb (75.3 kg)   LMP 12/09/2015   SpO2 100%   BMI 29.41 kg/m  Wt Readings from Last 3 Encounters:  08/10/23 166 lb  (75.3 kg)  02/14/23 165 lb 12.8 oz (75.2 kg)  11/05/22 170 lb (77.1 kg)       Assessment & Plan:   Problem List Items Addressed This Visit       Unprioritized   Type 2 diabetes mellitus with obesity (HCC) - Primary   Lab Results  Component Value Date   HGBA1C 8.0 (H) 07/01/2023   HGBA1C 7.4 (H) 02/04/2023   HGBA1C 9.3 (H) 10/29/2022   Lab Results  Component Value Date   MICROALBUR 0.72 10/20/2012   LDLCALC 129 (H) 07/01/2023   CREATININE 0.93 07/01/2023    Recent increase in HbA1c to 8.0 from 7.4, likely due to dietary indiscretions during the holiday season. Patient is not on any antidiabetic medications and prefers lifestyle modifications over pharmacotherapy. -Encouraged adherence to a diabetic-friendly diet and regular exercise. -Plan to reassess HbA1c and urine microalbumin in 3 months.       TOBACCO USER   1/2 to 1 PPD. Patient reports smoking half to one pack of cigarettes daily and is currently not interested in smoking cessation. -Advised on the health risks associated with smoking and offered assistance for when the patient is ready to quit.      Hyperlipidemia, mixed    LDL cholesterol level is elevated at 129 mg/dL, goal is less than 161 mg/dL. Patient is not interested in starting a statin. -Encouraged lifestyle modifications including a heart-healthy diet and regular exercise.      History of colonic polyps   Declines further colon cancer screening      Hematuria   Reports negative work up with urology back in 2018 which she states included negative cystoscopy.       Depression with anxiety   Stable on sertraline, continue  same.   Patient reports variable daily use of Xanax (2-4 times daily) for stress related to her job. -Continued prescription of Xanax with monitoring for appropriate use. -controlled substance contract is updated      Relevant Medications   ALPRAZolam (XANAX) 0.5 MG tablet   Other Visit Diagnoses       High risk  medication use       Relevant Medications   ALPRAZolam (XANAX) 0.5 MG tablet       I am having Ashley Jackson maintain her ONE TOUCH ULTRA 2, OneTouch Ultra Test, OneTouch Delica Plus Lancet33G, sertraline, OneTouch Ultra Control, and ALPRAZolam.  Meds ordered this encounter  Medications   ALPRAZolam (XANAX) 0.5 MG tablet    Sig: Take 1 tablet (0.5 mg total) by mouth 4 (four) times daily as needed for anxiety.    Dispense:  120 tablet    Refill:  0    Do not fill prior to 08/27/22    Supervising Provider:   Danise Edge A [[id number]]

## 2023-08-10 NOTE — Assessment & Plan Note (Addendum)
Lab Results  Component Value Date   HGBA1C 8.0 (H) 07/01/2023   HGBA1C 7.4 (H) 02/04/2023   HGBA1C 9.3 (H) 10/29/2022   Lab Results  Component Value Date   MICROALBUR 0.72 10/20/2012   LDLCALC 129 (H) 07/01/2023   CREATININE 0.93 07/01/2023    Recent increase in HbA1c to 8.0 from 7.4, likely due to dietary indiscretions during the holiday season. Patient is not on any antidiabetic medications and prefers lifestyle modifications over pharmacotherapy. -Encouraged adherence to a diabetic-friendly diet and regular exercise. -Plan to reassess HbA1c and urine microalbumin in 3 months.

## 2023-08-10 NOTE — Patient Instructions (Signed)
VISIT SUMMARY:  During your visit today, we discussed your recent health concerns and reviewed your current conditions, including diabetes, smoking, anxiety, and hyperlipidemia. We also talked about the importance of preventive screenings, although you have chosen to decline them at this time.  YOUR PLAN:  -TYPE 2 DIABETES MELLITUS: Type 2 diabetes is a condition where your body does not use insulin properly, leading to high blood sugar levels. Your recent HbA1c has increased to 8.0 from 7.4, likely due to dietary choices during the holidays. We encourage you to follow a diabetic-friendly diet and exercise regularly. We will reassess your HbA1c in 3 months.  -TOBACCO USE: Smoking is harmful to your health and increases the risk of many diseases. You currently smoke half to one pack of cigarettes daily and are not interested in quitting. We discussed the health risks and offered assistance for when you are ready to quit.  -ANXIETY: Anxiety is a condition that causes feelings of worry and stress. You are currently using Xanax 2-4 times daily to manage your stress related to your job. We will continue your prescription and monitor its use.  -HYPERLIPIDEMIA: Hyperlipidemia means having high levels of cholesterol in your blood, which can increase the risk of heart disease. Your LDL cholesterol level is 129 mg/dL, and the goal is less than 100 mg/dL. We encourage you to adopt a heart-healthy diet and exercise regularly.  -GENERAL HEALTH MAINTENANCE: Preventive screenings are important for early detection of potential health issues. You have chosen to decline colonoscopy, lung cancer screening, and cervical cancer screening at this time. We respect your decision and will reassess your willingness for these screenings at future visits.  INSTRUCTIONS:  Please follow up in 3 months with Dr. Cliffton Asters or myself to reassess your HbA1c and discuss any other health concerns.

## 2023-08-10 NOTE — Assessment & Plan Note (Addendum)
1/2 to 1 PPD. Patient reports smoking half to one pack of cigarettes daily and is currently not interested in smoking cessation. -Advised on the health risks associated with smoking and offered assistance for when the patient is ready to quit.

## 2023-08-10 NOTE — Assessment & Plan Note (Signed)
Reports negative work up with urology back in 2018 which she states included negative cystoscopy.

## 2023-08-10 NOTE — Assessment & Plan Note (Signed)
  LDL cholesterol level is elevated at 129 mg/dL, goal is less than 409 mg/dL. Patient is not interested in starting a statin. -Encouraged lifestyle modifications including a heart-healthy diet and regular exercise.

## 2023-08-10 NOTE — Assessment & Plan Note (Addendum)
Stable on sertraline, continue same.   Patient reports variable daily use of Xanax (2-4 times daily) for stress related to her job. -Continued prescription of Xanax with monitoring for appropriate use. -controlled substance contract is updated

## 2023-08-25 ENCOUNTER — Telehealth: Payer: Self-pay

## 2023-08-25 NOTE — Telephone Encounter (Signed)
 Copied from CRM [id number]. Topic: Clinical - Request for Lab/Test Order >> Aug 25, 2023 10:16 AM Maisie BROCKS wrote: Reason for CRM: Patient called and wanted to schedule a lab appointment a week before her upcoming visit with Dr. Domenica. She is requesting to have her A1C checked. She mentioned that she requested for this during her last OV with Melissa but it was not ordered. Please advise patient regarding labs. She does have access to MyChart.

## 2023-08-26 ENCOUNTER — Encounter: Payer: Self-pay | Admitting: Emergency Medicine

## 2023-08-26 NOTE — Telephone Encounter (Signed)
 Spoke with patient. She has a lab appointment scheduled for 11/07/2023

## 2023-08-26 NOTE — Telephone Encounter (Signed)
 Copied from CRM [id number]. Topic: General - Other >> Aug 26, 2023  9:21 AM Robinson H wrote: Reason for CRM: Patient called in stating she had a missed call to verify an appointment and also received a text message with a half of a text message stating to verify lab appointment and something that said can you put with Dr. Domenica. Please reach out to patient for some clarification.  [phone] Leave a detailed voice or text message if no answer

## 2023-08-26 NOTE — Telephone Encounter (Signed)
 Called patient. No answer. LVM Also sent the patient a message in MyChart

## 2023-10-05 ENCOUNTER — Other Ambulatory Visit: Payer: Self-pay | Admitting: Family Medicine

## 2023-10-27 ENCOUNTER — Other Ambulatory Visit: Payer: Self-pay | Admitting: Family

## 2023-10-27 DIAGNOSIS — Z79899 Other long term (current) drug therapy: Secondary | ICD-10-CM

## 2023-10-29 ENCOUNTER — Other Ambulatory Visit: Payer: Self-pay | Admitting: Family

## 2023-10-29 DIAGNOSIS — Z79899 Other long term (current) drug therapy: Secondary | ICD-10-CM

## 2023-10-30 ENCOUNTER — Other Ambulatory Visit: Payer: Self-pay | Admitting: Family

## 2023-10-30 DIAGNOSIS — Z79899 Other long term (current) drug therapy: Secondary | ICD-10-CM

## 2023-11-02 ENCOUNTER — Other Ambulatory Visit: Payer: Self-pay | Admitting: *Deleted

## 2023-11-02 DIAGNOSIS — E1169 Type 2 diabetes mellitus with other specified complication: Secondary | ICD-10-CM

## 2023-11-07 ENCOUNTER — Encounter: Payer: Self-pay | Admitting: Family Medicine

## 2023-11-07 ENCOUNTER — Other Ambulatory Visit (INDEPENDENT_AMBULATORY_CARE_PROVIDER_SITE_OTHER): Payer: Commercial Managed Care - HMO

## 2023-11-07 DIAGNOSIS — E1169 Type 2 diabetes mellitus with other specified complication: Secondary | ICD-10-CM | POA: Diagnosis not present

## 2023-11-07 DIAGNOSIS — E669 Obesity, unspecified: Secondary | ICD-10-CM | POA: Diagnosis not present

## 2023-11-07 LAB — HEMOGLOBIN A1C: Hgb A1c MFr Bld: 8.8 % — ABNORMAL HIGH (ref 4.6–6.5)

## 2023-11-09 ENCOUNTER — Encounter: Payer: Self-pay | Admitting: Emergency Medicine

## 2023-11-13 NOTE — Assessment & Plan Note (Signed)
 Encouraged complete cessation. Discussed need to quit as relates to risk of numerous cancers, cardiac and pulmonary disease as well as neurologic complications. Counseled for greater than 3 minutes. Is up past a PPD some days

## 2023-11-13 NOTE — Assessment & Plan Note (Signed)
hgba1c acceptable at last check at 7.4in June 2024.  minimize simple carbs. Increase exercise as tolerated. Continue current meds

## 2023-11-13 NOTE — Assessment & Plan Note (Signed)
Encourage heart healthy diet such as MIND or DASH diet, increase exercise, avoid trans fats, simple carbohydrates and processed foods, consider a krill or fish or flaxseed oil cap daily.  Patient continues to decline medications.  

## 2023-11-14 ENCOUNTER — Ambulatory Visit: Payer: Commercial Managed Care - HMO | Admitting: Family Medicine

## 2023-11-14 VITALS — BP 138/88 | HR 73 | Temp 97.6°F | Resp 18 | Ht 62.0 in | Wt 168.4 lb

## 2023-11-14 DIAGNOSIS — Z7984 Long term (current) use of oral hypoglycemic drugs: Secondary | ICD-10-CM

## 2023-11-14 DIAGNOSIS — R002 Palpitations: Secondary | ICD-10-CM

## 2023-11-14 DIAGNOSIS — E782 Mixed hyperlipidemia: Secondary | ICD-10-CM | POA: Diagnosis not present

## 2023-11-14 DIAGNOSIS — E1169 Type 2 diabetes mellitus with other specified complication: Secondary | ICD-10-CM | POA: Diagnosis not present

## 2023-11-14 DIAGNOSIS — E669 Obesity, unspecified: Secondary | ICD-10-CM

## 2023-11-14 DIAGNOSIS — F17200 Nicotine dependence, unspecified, uncomplicated: Secondary | ICD-10-CM

## 2023-11-14 DIAGNOSIS — Z79899 Other long term (current) drug therapy: Secondary | ICD-10-CM

## 2023-11-14 MED ORDER — ALPRAZOLAM 0.5 MG PO TABS: 0.5000 mg | ORAL_TABLET | Freq: Four times a day (QID) | ORAL | 3 refills | Status: DC | PRN

## 2023-11-14 NOTE — Patient Instructions (Signed)
 Omron blood pressure cuff upper arm check your blood pressure and pulse once to twice a month and let us know if BP is consistently above 140/90 and pulse should be 60-90.  Consider Jardiance 10 mg daily for sugar   Carbohydrate Counting for Diabetes Mellitus, Adult Carbohydrate counting is a method of keeping track of how many carbohydrates you eat. Eating carbohydrates increases the amount of sugar (glucose) in the blood. Counting how many carbohydrates you eat improves how well you manage your blood glucose. This, in turn, helps you manage your diabetes. Carbohydrates are measured in grams (g) per serving. It is important to know how many carbohydrates (in grams or by serving size) you can have in each meal. This is different for every person. A dietitian can help you make a meal plan and calculate how many carbohydrates you should have at each meal and snack. What foods contain carbohydrates? Carbohydrates are found in the following foods: Grains, such as breads and cereals. Dried beans and soy products. Starchy vegetables, such as potatoes, peas, and corn. Fruit and fruit juices. Milk and yogurt. Sweets and snack foods, such as cake, cookies, candy, chips, and soft drinks. How do I count carbohydrates in foods? There are two ways to count carbohydrates in food. You can read food labels or learn standard serving sizes of foods. You can use either of these methods or a combination of both. Using the Nutrition Facts label The Nutrition Facts list is included on the labels of almost all packaged foods and beverages in the [place]. It includes: The serving size. Information about nutrients in each serving, including the grams of carbohydrate per serving. To use the Nutrition Facts, decide how many servings you will have. Then, multiply the number of servings by the number of carbohydrates per serving. The resulting number is the total grams of carbohydrates that you will be  having. Learning the standard serving sizes of foods When you eat carbohydrate foods that are not packaged or do not include Nutrition Facts on the label, you need to measure the servings in order to count the grams of carbohydrates. Measure the foods that you will eat with a food scale or measuring cup, if needed. Decide how many standard-size servings you will eat. Multiply the number of servings by 15. For foods that contain carbohydrates, one serving equals 15 g of carbohydrates. For example, if you eat 2 cups or 10 oz (300 g) of strawberries, you will have eaten 2 servings and 30 g of carbohydrates (2 servings x 15 g = 30 g). For foods that have more than one food mixed, such as soups and casseroles, you must count the carbohydrates in each food that is included. The following list contains standard serving sizes of common carbohydrate-rich foods. Each of these servings has about 15 g of carbohydrates: 1 slice of bread. 1 six-inch (15 cm) tortilla. ? cup or 2 oz (53 g) cooked rice or pasta.  cup or 3 oz (85 g) cooked or canned, drained and rinsed beans or lentils.  cup or 3 oz (85 g) starchy vegetable, such as peas, corn, or squash.  cup or 4 oz (120 g) hot cereal.  cup or 3 oz (85 g) boiled or mashed potatoes, or  or 3 oz (85 g) of a large baked potato.  cup or 4 fl oz (118 mL) fruit juice. 1 cup or 8 fl oz (237 mL) milk. 1 small or 4 oz (106 g) apple.  or 2 oz (63  g) of a medium banana. 1 cup or 5 oz (150 g) strawberries. 3 cups or 1 oz (28.3 g) popped popcorn. What is an example of carbohydrate counting? To calculate the grams of carbohydrates in this sample meal, follow the steps shown below. Sample meal 3 oz (85 g) chicken breast. ? cup or 4 oz (106 g) brown rice.  cup or 3 oz (85 g) corn. 1 cup or 8 fl oz (237 mL) milk. 1 cup or 5 oz (150 g) strawberries with sugar-free whipped topping. Carbohydrate calculation Identify the foods that contain  carbohydrates: Rice. Corn. Milk. Strawberries. Calculate how many servings you have of each food: 2 servings rice. 1 serving corn. 1 serving milk. 1 serving strawberries. Multiply each number of servings by 15 g: 2 servings rice x 15 g = 30 g. 1 serving corn x 15 g = 15 g. 1 serving milk x 15 g = 15 g. 1 serving strawberries x 15 g = 15 g. Add together all of the amounts to find the total grams of carbohydrates eaten: 30 g + 15 g + 15 g + 15 g = 75 g of carbohydrates total. What are tips for following this plan? Shopping Develop a meal plan and then make a shopping list. Buy fresh and frozen vegetables, fresh and frozen fruit, dairy, eggs, beans, lentils, and whole grains. Look at food labels. Choose foods that have more fiber and less sugar. Avoid processed foods and foods with added sugars. Meal planning Aim to have the same number of grams of carbohydrates at each meal and for each snack time. Plan to have regular, balanced meals and snacks. Where to find more information American Diabetes Association: diabetes.org Centers for Disease Control and Prevention: Ton[employer].no Academy of Nutrition and Dietetics: eatright.org Association of Diabetes Care & Education Specialists: diabeteseducator.org Summary Carbohydrate counting is a method of keeping track of how many carbohydrates you eat. Eating carbohydrates increases the amount of sugar (glucose) in your blood. Counting how many carbohydrates you eat improves how well you manage your blood glucose. This helps you manage your diabetes. A dietitian can help you make a meal plan and calculate how many carbohydrates you should have at each meal and snack. This information is not intended to replace advice given to you by your health care provider. Make sure you discuss any questions you have with your health care provider. Document Revised: 03/11/2020 Document Reviewed: 03/12/2020 Elsevier Patient Education  2024 [employer].

## 2023-11-16 ENCOUNTER — Encounter: Payer: Self-pay | Admitting: Family Medicine

## 2023-11-16 NOTE — Progress Notes (Signed)
 Subjective:    Patient ID: Ashley Jackson, female    DOB: 1960-09-22, 63 y.o.   MRN: [id number]  Chief Complaint  Patient presents with   Follow-up    HPI Discussed the use of AI scribe software for clinical note transcription with the patient, who gave verbal consent to proceed.  History of Present Illness Ashley Jackson is a 63 year old female who presents for diabetes management.  Her HbA1c levels have increased from 7.4% to 8.8% over the past year. She is not currently on any medication for diabetes and is hesitant to start due to concerns about weight loss. She experiences increased urination, thirst, vague abdominal discomfort, and excessive fatigue. No recent sickness or chest pain.  She smokes about half a pack of cigarettes per day and is not interested in participating in a low-dose CT scanning program for lung cancer screening.  She is currently taking alprazolam, with a recent prescription of 120 tablets, and sertraline, with a 90-day supply and two refills remaining.  Her blood pressure is typically normal, but she acknowledges it can be slightly elevated in the clinic setting. She does not currently monitor her blood pressure at home but has a home cuff available.  Her ears feel full of wax and she experiences liquid discharge from her ears occasionally.    Past Medical History:  Diagnosis Date   Allergic state 01/24/2017   Anxiety state, unspecified 07/12/2007   COLONIC POLYPS, HX OF 05/02/2007   DEPRESSION 07/12/2007   DIABETES MELLITUS, TYPE II, UNCONTROLLED 02/25/2010   EXOGENOUS OBESITY 11/07/2007   Hematuria 06/03/2017   HYPERGLYCEMIA 07/12/2007   HYPERLIPIDEMIA 02/07/2008   Low back pain 04/29/2013   Overweight(278.02) 06/24/2010   Palpitations 01/20/2016   Preventative health care 04/29/2013   Tobacco abuse 10/24/2012   TOBACCO USER 06/24/2010    Past Surgical History:  Procedure Laterality Date   COLONOSCOPY  2006    Family History  Problem Relation Age of  Onset   Liver cancer Mother    Hypertension Other    Hypertension Father    Stroke Brother    Seizures Son    Diabetes Maternal Uncle    Breast cancer Neg Hx    Prostate cancer Neg Hx    Heart disease Neg Hx     Social History   Socioeconomic History   Marital status: Divorced    Spouse name: Not on file   Number of children: Not on file   Years of education: Not on file   Highest education level: Associate degree: academic program  Occupational History   Not on file  Tobacco Use   Smoking status: Every Day    Current packs/day: 1.00    Types: Cigarettes   Smokeless tobacco: Never  Vaping Use   Vaping status: Never Used  Substance and Sexual Activity   Alcohol use: Not Currently   Drug use: No   Sexual activity: Not Currently  Other Topics Concern   Not on file  Social History Narrative   Not on file   Social Drivers of Health   Financial Resource Strain: Low Risk  (11/09/2023)   Overall Financial Resource Strain (CARDIA)    Difficulty of Paying Living Expenses: Not very hard  Food Insecurity: Food Insecurity Present (11/09/2023)   Hunger Vital Sign    Worried About Running Out of Food in the Last Year: Sometimes true    Ran Out of Food in the Last Year: Never true  Transportation Needs: No Transportation  Needs (11/09/2023)   PRAPARE - Administrator, Civil Service (Medical): No    Lack of Transportation (Non-Medical): No  Physical Activity: Insufficiently Active (11/09/2023)   Exercise Vital Sign    Days of Exercise per Week: 2 days    Minutes of Exercise per Session: 20 min  Stress: Stress Concern Present (11/09/2023)   [employer] of Occupational Health - Occupational Stress Questionnaire    Feeling of Stress : To some extent  Social Connections: Socially Isolated (11/09/2023)   Social Connection and Isolation Panel [NHANES]    Frequency of Communication with Friends and Family: More than three times a week    Frequency of Social Gatherings  with Friends and Family: Once a week    Attends Religious Services: Never    Database administrator or Organizations: No    Attends Engineer, structural: Not on file    Marital Status: Divorced  Intimate Partner Violence: Unknown (11/26/2021)   Received from [employer], [facility]   HITS    Physically Hurt: Not on file    Insult or Talk Down To: Not on file    Threaten Physical Harm: Not on file    Scream or Curse: Not on file    Outpatient Medications Prior to Visit  Medication Sig Dispense Refill   Blood Glucose Calibration (ONETOUCH ULTRA CONTROL) LIQD 1 DROP BY DOES NOT APPLY ROUTE EVERY 30 (THIRTY) DAYS. TO CHECK ONE TOUCH ULTRA 2 CALIBRATION 30 each 3   Blood Glucose Monitoring Suppl (ONE TOUCH ULTRA 2) w/Device KIT Check blood sugar once daily. Dx. Code E11.69 1 kit 0   Lancets (ONETOUCH DELICA PLUS LANCET33G) MISC CHECK BLOOD SUGAR ONCE DAILY. DX. CODE E11.69 100 each 12   ONETOUCH ULTRA TEST test strip CHECK BLOOD SUGAR ONCE DAILY. DX. CODE E11.69 50 strip 3   sertraline (ZOLOFT) 100 MG tablet TAKE 1 TABLET BY MOUTH EVERY DAY 90 tablet 2   ALPRAZolam (XANAX) 0.5 MG tablet TAKE 1 TABLET (0.5 MG TOTAL) BY MOUTH 4 (FOUR) TIMES DAILY AS NEEDED FOR ANXIETY. 120 tablet 0   No facility-administered medications prior to visit.    Allergies  Allergen Reactions   Atarax [Hydroxyzine] Shortness Of Breath    Anxiety, shortness of breath, tachycardia    Cortisone     HEART RACING   Levofloxacin     anxiety   Sulfa Antibiotics     Panic attacks   Sulfonamide Derivatives     REACTION: Panic attacks   Levofloxacin Anxiety    Review of Systems  Constitutional:  Positive for malaise/fatigue. Negative for fever.  HENT:  Negative for congestion.   Eyes:  Negative for blurred vision.  Respiratory:  Negative for shortness of breath.   Cardiovascular:  Negative for chest pain, palpitations and leg swelling.  Gastrointestinal:  Negative for abdominal pain, blood in stool  and nausea.  Genitourinary:  Negative for dysuria and frequency.  Musculoskeletal:  Negative for falls.  Skin:  Negative for rash.  Neurological:  Negative for dizziness, loss of consciousness and headaches.  Endo/Heme/Allergies:  Negative for environmental allergies.  Psychiatric/Behavioral:  Negative for depression. The patient is nervous/anxious.        Objective:    Physical Exam Constitutional:      General: She is not in acute distress.    Appearance: Normal appearance. She is well-developed. She is not toxic-appearing.  HENT:     Head: Normocephalic and atraumatic.     Right Ear: External ear  normal.     Left Ear: External ear normal.     Nose: Nose normal.  Eyes:     General:        Right eye: No discharge.        Left eye: No discharge.     Conjunctiva/sclera: Conjunctivae normal.  Neck:     Thyroid: No thyromegaly.  Cardiovascular:     Rate and Rhythm: Normal rate and regular rhythm.     Heart sounds: Normal heart sounds. No murmur heard. Pulmonary:     Effort: Pulmonary effort is normal. No respiratory distress.     Breath sounds: Normal breath sounds.  Abdominal:     General: Bowel sounds are normal.     Palpations: Abdomen is soft.     Tenderness: There is no abdominal tenderness. There is no guarding.  Musculoskeletal:        General: Normal range of motion.     Cervical back: Neck supple.  Lymphadenopathy:     Cervical: No cervical adenopathy.  Skin:    General: Skin is warm and dry.  Neurological:     Mental Status: She is alert and oriented to person, place, and time.  Psychiatric:        Mood and Affect: Mood normal.        Behavior: Behavior normal.        Thought Content: Thought content normal.        Judgment: Judgment normal.     BP 138/88 (BP Location: Left Arm, Patient Position: Sitting, Cuff Size: Normal)   Pulse 73   Temp 97.6 F (36.4 C) (Oral)   Resp 18   Ht 5\' 2"  (1.575 m)   Wt 168 lb 6.4 oz (76.4 kg)   LMP 12/09/2015   SpO2  98%   BMI 30.80 kg/m  Wt Readings from Last 3 Encounters:  11/14/23 168 lb 6.4 oz (76.4 kg)  08/10/23 166 lb (75.3 kg)  02/14/23 165 lb 12.8 oz (75.2 kg)    Diabetic Foot Exam - Simple   No data filed    Lab Results  Component Value Date   WBC 7.6 07/01/2023   HGB 14.9 07/01/2023   HCT 44.6 07/01/2023   PLT 270.0 07/01/2023   GLUCOSE 180 (H) 07/01/2023   CHOL 219 (H) 07/01/2023   TRIG 159.0 (H) 07/01/2023   HDL 58.40 07/01/2023   LDLDIRECT 126.0 03/25/2022   LDLCALC 129 (H) 07/01/2023   ALT 13 07/01/2023   AST 15 07/01/2023   NA 140 07/01/2023   K 4.4 07/01/2023   CL 104 07/01/2023   CREATININE 0.93 07/01/2023   BUN 16 07/01/2023   CO2 29 07/01/2023   TSH 1.98 07/01/2023   HGBA1C 8.8 (H) 11/07/2023   MICROALBUR 0.72 10/20/2012    Lab Results  Component Value Date   TSH 1.98 07/01/2023   Lab Results  Component Value Date   WBC 7.6 07/01/2023   HGB 14.9 07/01/2023   HCT 44.6 07/01/2023   MCV 92.8 07/01/2023   PLT 270.0 07/01/2023   Lab Results  Component Value Date   NA 140 07/01/2023   K 4.4 07/01/2023   CO2 29 07/01/2023   GLUCOSE 180 (H) 07/01/2023   BUN 16 07/01/2023   CREATININE 0.93 07/01/2023   BILITOT 0.4 07/01/2023   ALKPHOS 60 07/01/2023   AST 15 07/01/2023   ALT 13 07/01/2023   PROT 7.0 07/01/2023   ALBUMIN 4.2 07/01/2023   CALCIUM 9.1 07/01/2023   GFR 66.05 07/01/2023  Lab Results  Component Value Date   CHOL 219 (H) 07/01/2023   Lab Results  Component Value Date   HDL 58.40 07/01/2023   Lab Results  Component Value Date   LDLCALC 129 (H) 07/01/2023   Lab Results  Component Value Date   TRIG 159.0 (H) 07/01/2023   Lab Results  Component Value Date   CHOLHDL 4 07/01/2023   Lab Results  Component Value Date   HGBA1C 8.8 (H) 11/07/2023       Assessment & Plan:  Hyperlipidemia, mixed Assessment & Plan: Encourage heart healthy diet such as MIND or DASH diet, increase exercise, avoid trans fats, simple carbohydrates  and processed foods, consider a krill or fish or flaxseed oil cap daily.  Patient continues to decline medications.   Orders: -     Lipid panel; Future -     Lipid panel; Future  Type 2 diabetes mellitus with obesity [facility]) Assessment & Plan: hgba1c acceptable at last check at 7.4in June 2024.  minimize simple carbs. Increase exercise as tolerated. Continue current meds   Orders: -     Comprehensive metabolic panel; Future -     Hemoglobin A1c; Future -     Comprehensive metabolic panel; Future -     Hemoglobin A1c; Future -     Microalbumin / creatinine urine ratio; Future  TOBACCO USER Assessment & Plan: Encouraged complete cessation. Discussed need to quit as relates to risk of numerous cancers, cardiac and pulmonary disease as well as neurologic complications. Counseled for greater than 3 minutes. Is up past a PPD some days  Orders: -     CBC with Differential/Platelet; Future  Palpitations -     CBC with Differential/Platelet; Future -     TSH; Future  High risk medication use -     ALPRAZolam; Take 1 tablet (0.5 mg total) by mouth 4 (four) times daily as needed for anxiety.  Dispense: 120 tablet; Refill: 3    Assessment and Plan Assessment & Plan Type 2 Diabetes Mellitus Hemoglobin A1c increased to 8.8%, indicating poor glycemic control. Discussed hyperglycemia risks and medication benefits. She declined medication but was advised on symptom monitoring. Explained lifestyle changes are essential, but medication may be needed. - Consider Jardiance 10 mg daily if she agrees to medication. - Monitor for symptoms of worsening diabetes. - Order labs in 3 months to reassess A1c. - Order urine test in 6 months to check for proteinuria.  Hypertension Blood pressure at high normal levels. Discussed home monitoring importance and its role in treatment decisions. - Recommend home blood pressure monitoring once to twice a month. - Advise reporting if blood pressure consistently  exceeds 140/90 mmHg. - Advise reporting pulse if consistently outside the range of 60-90 bpm.  Tobacco Use Continues smoking half a pack daily. Discussed lung cancer risk and screening benefits, which she declined. - Encourage smoking cessation. - Offer low-dose CT scanning program for lung cancer screening if she becomes interested.  General Health Maintenance BMI is 30.8. Discussed weight loss benefits for cardiovascular health and glycemic control. - Encourage a balanced diet with fresh fruits and vegetables. - Advise on reducing carbohydrate intake to improve glycemic control. - Encourage regular physical activity and hydration.     Danise Edge, MD

## 2023-12-21 ENCOUNTER — Encounter: Payer: Self-pay | Admitting: Family Medicine

## 2023-12-30 NOTE — Telephone Encounter (Signed)
 Patient contact office regarding accommodation form that was completed. Patient states form was completed incorrectly. Patient is planning to stop by the office on Monday to have new form completed.

## 2024-01-06 ENCOUNTER — Ambulatory Visit: Admitting: Student

## 2024-01-06 ENCOUNTER — Encounter: Payer: Self-pay | Admitting: Student

## 2024-01-06 ENCOUNTER — Encounter: Payer: Self-pay | Admitting: *Deleted

## 2024-01-06 VITALS — BP 128/88 | HR 69 | Temp 97.9°F | Resp 12 | Ht 62.0 in | Wt 170.4 lb

## 2024-01-06 DIAGNOSIS — F418 Other specified anxiety disorders: Secondary | ICD-10-CM

## 2024-01-06 NOTE — Progress Notes (Signed)
 Subjective:     Patient ID: Ashley Jackson, female    DOB: [birth year], 63 y.o.   MRN: [id number]  No chief complaint on file.   HPI Ashley Jackson 63 year old female requesting revised work accommodation letter to allow 2-3 days off per week and/or the ability to leave work early as needed r/t anxiety. 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  Pt requesting work from home recommendation be removed. She expressed concern about having exhausted her sick and vacation time due to her anxiety being unpredictable. She clarified that these episodes are not related to social engagement. When asked whether working from home might be a more suitable option, the patient stated that remote work increases her anxiety and that she prefers to work in person. Discussed the possibility of reducing work hours as a potential way to manage her symptoms and improve work-life balance. Patient declined this option, stating she does not wish to reduce her hours at this time. Patient reports that she does not wish to pursue further psychotherapy or psychiatric evaluation at this time.    History of Present Illness          There are no preventive care reminders to display for this patient.   Past Medical History:  Diagnosis Date   Allergic state 01/24/2017   Anxiety state, unspecified 07/12/2007   COLONIC POLYPS, HX OF 05/02/2007   DEPRESSION 07/12/2007   DIABETES MELLITUS, TYPE II, UNCONTROLLED 02/25/2010   EXOGENOUS OBESITY 11/07/2007   Hematuria 06/03/2017   HYPERGLYCEMIA 07/12/2007   HYPERLIPIDEMIA 02/07/2008   Low back pain 04/29/2013   Overweight(278.02) 06/24/2010   Palpitations 01/20/2016   Preventative health care 04/29/2013   Tobacco abuse 10/24/2012   TOBACCO USER 06/24/2010    Past Surgical History:  Procedure Laterality Date   COLONOSCOPY  2006    Family History  Problem Relation Age of Onset   Liver cancer  Mother    Hypertension Other    Hypertension Father    Stroke Brother    Seizures Son    Diabetes Maternal Uncle    Breast cancer Neg Hx    Prostate cancer Neg Hx    Heart disease Neg Hx     Social History   Socioeconomic History   Marital status: Divorced    Spouse name: Not on file   Number of children: Not on file   Years of education: Not on file   Highest education level: Associate degree: academic program  Occupational History   Not on file  Tobacco Use   Smoking status: Every Day    Current packs/day: 1.00    Types: Cigarettes   Smokeless tobacco: Never  Vaping Use   Vaping status: Never Used  Substance and Sexual Activity   Alcohol use: Not Currently   Drug use: No   Sexual activity: Not Currently  Other Topics Concern   Not on file  Social History Narrative   Not on file   Social Drivers of Health   Financial Resource Strain: Low Risk  (11/09/2023)   Overall Financial Resource Strain (CARDIA)    Difficulty of Paying Living Expenses: Not very hard  Food Insecurity: Food Insecurity Present (11/09/2023)   Hunger Vital Sign    Worried About Running Out of Food in the Last Year: Sometimes true    Ran Out of Food in the Last Year: Never true  Transportation Needs: No Transportation Needs (11/09/2023)   PRAPARE -  Administrator, Civil Service (Medical): No    Lack of Transportation (Non-Medical): No  Physical Activity: Insufficiently Active (11/09/2023)   Exercise Vital Sign    Days of Exercise per Week: 2 days    Minutes of Exercise per Session: 20 min  Stress: Stress Concern Present (11/09/2023)   [employer] of Occupational Health - Occupational Stress Questionnaire    Feeling of Stress : To some extent  Social Connections: Socially Isolated (11/09/2023)   Social Connection and Isolation Panel [NHANES]    Frequency of Communication with Friends and Family: More than three times a week    Frequency of Social Gatherings with Friends and  Family: Once a week    Attends Religious Services: Never    Database administrator or Organizations: No    Attends Engineer, structural: Not on file    Marital Status: Divorced  Intimate Partner Violence: Unknown (11/26/2021)   Received from [employer], [facility]   HITS    Physically Hurt: Not on file    Insult or Talk Down To: Not on file    Threaten Physical Harm: Not on file    Scream or Curse: Not on file    Outpatient Medications Prior to Visit  Medication Sig Dispense Refill   ALPRAZolam  (XANAX ) 0.5 MG tablet Take 1 tablet (0.5 mg total) by mouth 4 (four) times daily as needed for anxiety. 120 tablet 3   Blood Glucose Calibration (ONETOUCH ULTRA CONTROL) LIQD 1 DROP BY DOES NOT APPLY ROUTE EVERY 30 (THIRTY) DAYS. TO CHECK ONE TOUCH ULTRA 2 CALIBRATION 30 each 3   Blood Glucose Monitoring Suppl (ONE TOUCH ULTRA 2) w/Device KIT Check blood sugar once daily. Dx. Code E11.69 1 kit 0   Lancets (ONETOUCH DELICA PLUS LANCET33G) MISC CHECK BLOOD SUGAR ONCE DAILY. DX. CODE E11.69 100 each 12   ONETOUCH ULTRA TEST test strip CHECK BLOOD SUGAR ONCE DAILY. DX. CODE E11.69 50 strip 3   sertraline  (ZOLOFT ) 100 MG tablet TAKE 1 TABLET BY MOUTH EVERY DAY 90 tablet 2   No facility-administered medications prior to visit.    Allergies  Allergen Reactions   Atarax [Hydroxyzine] Shortness Of Breath    Anxiety, shortness of breath, tachycardia    Cortisone     HEART RACING   Levofloxacin     anxiety   Sulfa Antibiotics     Panic attacks   Sulfonamide Derivatives     REACTION: Panic attacks   Levofloxacin Anxiety    ROS     Objective:     Physical Exam  General: No acute distress. Awake and conversant.  Respiratory: Respirations are non-labored. No wheezing.  Skin: Warm. No rashes or ulcers.  Psych: Alert and oriented.  MSK: Normal ambulation.    BP 128/88 (BP Location: Left Arm, Patient Position: Sitting, Cuff Size: Normal)   Pulse 69   Temp 97.9 F (36.6 C)  (Oral)   Resp 12   Ht 5\' 2"  (1.575 m)   Wt 170 lb 6.4 oz (77.3 kg)   LMP 12/09/2015   SpO2 94%   BMI 31.17 kg/m  Wt Readings from Last 3 Encounters:  01/06/24 170 lb 6.4 oz (77.3 kg)  11/14/23 168 lb 6.4 oz (76.4 kg)  08/10/23 166 lb (75.3 kg)       Assessment & Plan:   Problem List Items Addressed This Visit     Depression with anxiety - Primary   Discussed with the patient that her current request is  more appropriate for evaluation and management by psychiatry. Offered work-from-home accommodation and written support for a limited, medically necessary accommodation allowing early departure from work up to 1-2 times per month as an interim measure until psychological care is established. Recommended psychiatric referral to help develop a more comprehensive treatment plan and evaluate long-term accommodations. Accommodation documentation was faxed to her workplace per her request.   I am having Ashley Jackson maintain her ONE TOUCH ULTRA 2, OneTouch Ultra Test, OneTouch Delica Plus Lancet33G, OneTouch Ultra Control, sertraline , and ALPRAZolam .  No orders of the defined types were placed in this encounter.

## 2024-01-08 ENCOUNTER — Encounter: Payer: Self-pay | Admitting: Family Medicine

## 2024-01-10 ENCOUNTER — Telehealth: Payer: Self-pay | Admitting: Neurology

## 2024-01-10 NOTE — Telephone Encounter (Signed)
 Copied from CRM [id number]. Topic: General - Other >> Jan 10, 2024  8:29 AM [name] wrote: Reason for CRM: Patient called.. checking status of speaking w/ Dr Aram Knights from notes on 5/18 in system.Ashley Jackson

## 2024-01-13 ENCOUNTER — Encounter: Payer: Self-pay | Admitting: Family Medicine

## 2024-01-13 ENCOUNTER — Telehealth: Admitting: Family Medicine

## 2024-01-13 VITALS — Ht 62.0 in

## 2024-01-13 DIAGNOSIS — Z6831 Body mass index (BMI) 31.0-31.9, adult: Secondary | ICD-10-CM

## 2024-01-13 DIAGNOSIS — E1169 Type 2 diabetes mellitus with other specified complication: Secondary | ICD-10-CM

## 2024-01-13 DIAGNOSIS — F418 Other specified anxiety disorders: Secondary | ICD-10-CM

## 2024-01-13 DIAGNOSIS — E669 Obesity, unspecified: Secondary | ICD-10-CM

## 2024-01-16 NOTE — Assessment & Plan Note (Signed)
 Patient struggling with high levels of anxiety and suffering from unpredictable panic attacks. She is taking her Sertraline  regularly and it does help some but she can have several days a week when she is unable to go to work or stay at work due to anxiety attacks. Her work paperwork will be filled out to allow for prn missed days or having to leave early 2 days per week

## 2024-01-16 NOTE — Progress Notes (Signed)
 MyChart Video Visit    Virtual Visit via Video Note   This patient is at least at moderate risk for complications without adequate follow up. This format is felt to be most appropriate for this patient at this time. Physical exam was limited by quality of the video and audio technology used for the visit. Camilo Cella was able to get the patient set up on a video visit but audio kept failing on video so transferred to telephone to complete  Patient location: home Patient and provider in visit Provider location: Office  I discussed the limitations of evaluation and management by telemedicine and the availability of in person appointments. The patient expressed understanding and agreed to proceed.  Visit Date: 01/13/2024  Today's healthcare provider: Randie Bustle, MD     Subjective:    Patient ID: Ashley Jackson, female    DOB: 1961-08-23, 63 y.o.   MRN: [id number]  Chief Complaint  Patient presents with   Follow-up    Discuss FMLA     HPI Patient is in today for evaluation of her anxiety and depression. She is working at [employer] of [place] and she is doing well some days but other days she is suffering panic attacks that require her to miss work or leave early. When she gets overwhelmed she is unable to function or concentrate in a high stress work environment.   Past Medical History:  Diagnosis Date   Allergic state 01/24/2017   Anxiety state, unspecified 07/12/2007   COLONIC POLYPS, HX OF 05/02/2007   DEPRESSION 07/12/2007   DIABETES MELLITUS, TYPE II, UNCONTROLLED 02/25/2010   EXOGENOUS OBESITY 11/07/2007   Hematuria 06/03/2017   HYPERGLYCEMIA 07/12/2007   HYPERLIPIDEMIA 02/07/2008   Low back pain 04/29/2013   Overweight(278.02) 06/24/2010   Palpitations 01/20/2016   Preventative health care 04/29/2013   Tobacco abuse 10/24/2012   TOBACCO USER 06/24/2010    Past Surgical History:  Procedure Laterality Date   COLONOSCOPY  2006    Family History  Problem Relation Age of Onset    Liver cancer Mother    Hypertension Other    Hypertension Father    Stroke Brother    Seizures Son    Diabetes Maternal Uncle    Breast cancer Neg Hx    Prostate cancer Neg Hx    Heart disease Neg Hx     Social History   Socioeconomic History   Marital status: Divorced    Spouse name: Not on file   Number of children: Not on file   Years of education: Not on file   Highest education level: Associate degree: academic program  Occupational History   Not on file  Tobacco Use   Smoking status: Every Day    Current packs/day: 1.00    Types: Cigarettes   Smokeless tobacco: Never  Vaping Use   Vaping status: Never Used  Substance and Sexual Activity   Alcohol use: Not Currently   Drug use: No   Sexual activity: Not Currently  Other Topics Concern   Not on file  Social History Narrative   Not on file   Social Drivers of Health   Financial Resource Strain: Low Risk  (11/09/2023)   Overall Financial Resource Strain (CARDIA)    Difficulty of Paying Living Expenses: Not very hard  Food Insecurity: Food Insecurity Present (11/09/2023)   Hunger Vital Sign    Worried About Running Out of Food in the Last Year: Sometimes true    Ran Out of Food in the  Last Year: Never true  Transportation Needs: No Transportation Needs (11/09/2023)   PRAPARE - Administrator, Civil Service (Medical): No    Lack of Transportation (Non-Medical): No  Physical Activity: Insufficiently Active (11/09/2023)   Exercise Vital Sign    Days of Exercise per Week: 2 days    Minutes of Exercise per Session: 20 min  Stress: Stress Concern Present (11/09/2023)   [employer] of Occupational Health - Occupational Stress Questionnaire    Feeling of Stress : To some extent  Social Connections: Socially Isolated (11/09/2023)   Social Connection and Isolation Panel [NHANES]    Frequency of Communication with Friends and Family: More than three times a week    Frequency of Social Gatherings with  Friends and Family: Once a week    Attends Religious Services: Never    Database administrator or Organizations: No    Attends Engineer, structural: Not on file    Marital Status: Divorced  Intimate Partner Violence: Unknown (11/26/2021)   Received from [facility]   HITS    Physically Hurt: Not on file    Insult or Talk Down To: Not on file    Threaten Physical Harm: Not on file    Scream or Curse: Not on file    Outpatient Medications Prior to Visit  Medication Sig Dispense Refill   ALPRAZolam  (XANAX ) 0.5 MG tablet Take 1 tablet (0.5 mg total) by mouth 4 (four) times daily as needed for anxiety. 120 tablet 3   Blood Glucose Calibration (ONETOUCH ULTRA CONTROL) LIQD 1 DROP BY DOES NOT APPLY ROUTE EVERY 30 (THIRTY) DAYS. TO CHECK ONE TOUCH ULTRA 2 CALIBRATION 30 each 3   Blood Glucose Monitoring Suppl (ONE TOUCH ULTRA 2) w/Device KIT Check blood sugar once daily. Dx. Code E11.69 1 kit 0   Lancets (ONETOUCH DELICA PLUS LANCET33G) MISC CHECK BLOOD SUGAR ONCE DAILY. DX. CODE E11.69 100 each 12   ONETOUCH ULTRA TEST test strip CHECK BLOOD SUGAR ONCE DAILY. DX. CODE E11.69 50 strip 3   sertraline  (ZOLOFT ) 100 MG tablet TAKE 1 TABLET BY MOUTH EVERY DAY 90 tablet 2   No facility-administered medications prior to visit.    Allergies  Allergen Reactions   Atarax [Hydroxyzine] Shortness Of Breath    Anxiety, shortness of breath, tachycardia    Cortisone     HEART RACING   Levofloxacin     anxiety   Sulfa Antibiotics     Panic attacks   Sulfonamide Derivatives     REACTION: Panic attacks   Levofloxacin Anxiety    Review of Systems  Constitutional:  Negative for malaise/fatigue.  Respiratory:  Positive for shortness of breath.   Cardiovascular:  Positive for palpitations.  Gastrointestinal:  Negative for nausea.  Genitourinary:  Negative for frequency.  Musculoskeletal:  Negative for falls.  Neurological:  Negative for loss of consciousness.   Psychiatric/Behavioral:  Negative for depression, hallucinations and substance abuse. The patient is nervous/anxious.        Objective:     Physical Exam Constitutional:      General: She is not in acute distress.    Appearance: Normal appearance. She is not ill-appearing or toxic-appearing.  HENT:     Head: Normocephalic and atraumatic.     Right Ear: External ear normal.     Left Ear: External ear normal.     Nose: Nose normal.  Eyes:     General:        Right eye:  No discharge.        Left eye: No discharge.  Pulmonary:     Effort: Pulmonary effort is normal.  Skin:    Findings: No rash.  Neurological:     Mental Status: She is alert and oriented to person, place, and time.  Psychiatric:        Behavior: Behavior normal.     Ht 5\' 2"  (1.575 m)   LMP 12/09/2015   BMI 31.17 kg/m  Wt Readings from Last 3 Encounters:  01/06/24 170 lb 6.4 oz (77.3 kg)  11/14/23 168 lb 6.4 oz (76.4 kg)  08/10/23 166 lb (75.3 kg)       Assessment & Plan:  Type 2 diabetes mellitus with obesity (HCC) Assessment & Plan: hgba1c unacceptable but patient not willing to start new meds at this time, she will minimize simple carbs. Increase exercise as tolerated. Continue current meds    Depression with anxiety Assessment & Plan: Patient struggling with high levels of anxiety and suffering from unpredictable panic attacks. She is taking her Sertraline  regularly and it does help some but she can have several days a week when she is unable to go to work or stay at work due to anxiety attacks. Her work paperwork will be filled out to allow for prn missed days or having to leave early 2 days per week      I discussed the assessment and treatment plan with the patient. The patient was provided an opportunity to ask questions and all were answered. The patient agreed with the plan and demonstrated an understanding of the instructions.   The patient was advised to call back or seek an in-person  evaluation if the symptoms worsen or if the condition fails to improve as anticipated.  Randie Bustle, MD [facility] at [facility] [phone] (phone) [phone] (fax)  [facility]

## 2024-01-16 NOTE — Assessment & Plan Note (Addendum)
 hgba1c unacceptable but patient not willing to start new meds at this time, she will minimize simple carbs. Increase exercise as tolerated. Continue current meds

## 2024-01-17 ENCOUNTER — Telehealth: Payer: Self-pay | Admitting: Family Medicine

## 2024-01-17 NOTE — Telephone Encounter (Signed)
 Pt dropped off forms to be completed by pcp. Pt wants a copy of completed forms mailed to her, pt wants forms faxed and a phone call when forms have been faxed and mailed.

## 2024-01-18 NOTE — Telephone Encounter (Signed)
 Forms placed in provider box to be completed.

## 2024-01-19 DIAGNOSIS — Z0279 Encounter for issue of other medical certificate: Secondary | ICD-10-CM

## 2024-01-26 DIAGNOSIS — Z0279 Encounter for issue of other medical certificate: Secondary | ICD-10-CM

## 2024-02-14 ENCOUNTER — Ambulatory Visit: Payer: Self-pay | Admitting: Family Medicine

## 2024-02-14 ENCOUNTER — Other Ambulatory Visit (INDEPENDENT_AMBULATORY_CARE_PROVIDER_SITE_OTHER)

## 2024-02-14 DIAGNOSIS — E782 Mixed hyperlipidemia: Secondary | ICD-10-CM

## 2024-02-14 DIAGNOSIS — E1169 Type 2 diabetes mellitus with other specified complication: Secondary | ICD-10-CM | POA: Diagnosis not present

## 2024-02-14 DIAGNOSIS — F17200 Nicotine dependence, unspecified, uncomplicated: Secondary | ICD-10-CM

## 2024-02-14 DIAGNOSIS — E669 Obesity, unspecified: Secondary | ICD-10-CM

## 2024-02-14 LAB — CBC WITH DIFFERENTIAL/PLATELET
Basophils Absolute: 0 10*3/uL (ref 0.0–0.1)
Basophils Relative: 0.3 % (ref 0.0–3.0)
Eosinophils Absolute: 0.1 10*3/uL (ref 0.0–0.7)
Eosinophils Relative: 0.9 % (ref 0.0–5.0)
HCT: 44.6 % (ref 36.0–46.0)
Hemoglobin: 15.3 g/dL — ABNORMAL HIGH (ref 12.0–15.0)
Lymphocytes Relative: 37.3 % (ref 12.0–46.0)
Lymphs Abs: 3.1 10*3/uL (ref 0.7–4.0)
MCHC: 34.4 g/dL (ref 30.0–36.0)
MCV: 90.4 fl (ref 78.0–100.0)
Monocytes Absolute: 0.5 10*3/uL (ref 0.1–1.0)
Monocytes Relative: 6.4 % (ref 3.0–12.0)
Neutro Abs: 4.6 10*3/uL (ref 1.4–7.7)
Neutrophils Relative %: 55.1 % (ref 43.0–77.0)
Platelets: 260 10*3/uL (ref 150.0–400.0)
RBC: 4.93 Mil/uL (ref 3.87–5.11)
RDW: 12.5 % (ref 11.5–15.5)
WBC: 8.4 10*3/uL (ref 4.0–10.5)

## 2024-02-14 LAB — COMPREHENSIVE METABOLIC PANEL WITH GFR
ALT: 17 U/L (ref 0–35)
AST: 16 U/L (ref 0–37)
Albumin: 4.2 g/dL (ref 3.5–5.2)
Alkaline Phosphatase: 66 U/L (ref 39–117)
BUN: 14 mg/dL (ref 6–23)
CO2: 27 meq/L (ref 19–32)
Calcium: 9.2 mg/dL (ref 8.4–10.5)
Chloride: 102 meq/L (ref 96–112)
Creatinine, Ser: 0.83 mg/dL (ref 0.40–1.20)
GFR: 75.38 mL/min (ref >60.00)
Glucose, Bld: 237 mg/dL — ABNORMAL HIGH (ref 70–99)
Potassium: 4 meq/L (ref 3.5–5.1)
Sodium: 136 meq/L (ref 135–145)
Total Bilirubin: 0.4 mg/dL (ref 0.2–1.2)
Total Protein: 7.1 g/dL (ref 6.0–8.3)

## 2024-02-14 LAB — LIPID PANEL
Cholesterol: 236 mg/dL — ABNORMAL HIGH (ref 0–200)
HDL: 69.5 mg/dL (ref >39.00)
LDL Cholesterol: 117 mg/dL — ABNORMAL HIGH (ref 0–99)
NonHDL: 166.48
Total CHOL/HDL Ratio: 3
Triglycerides: 247 mg/dL — ABNORMAL HIGH (ref 0.0–149.0)
VLDL: 49.4 mg/dL — ABNORMAL HIGH (ref 0.0–40.0)

## 2024-02-14 LAB — HEMOGLOBIN A1C: Hgb A1c MFr Bld: 9.2 % — ABNORMAL HIGH (ref 4.6–6.5)

## 2024-02-15 MED ORDER — METFORMIN HCL 500 MG PO TABS: 500.0000 mg | ORAL_TABLET | Freq: Two times a day (BID) | ORAL | 0 refills | Status: DC

## 2024-02-15 MED ORDER — ATORVASTATIN CALCIUM 10 MG PO TABS: 10.0000 mg | ORAL_TABLET | Freq: Every day | ORAL | 0 refills | Status: AC

## 2024-04-04 ENCOUNTER — Other Ambulatory Visit: Payer: Self-pay | Admitting: Family Medicine

## 2024-04-04 DIAGNOSIS — Z79899 Other long term (current) drug therapy: Secondary | ICD-10-CM

## 2024-04-05 NOTE — Telephone Encounter (Signed)
 Requesting: alprazolam  0.5mg   Contract:09/06/23 UDS: 12/10/21 Last Visit: 01/13/24 Next Visit: 05/24/24 Last Refill: 11/14/23 #120 and 3RF   Please Advise

## 2024-05-17 ENCOUNTER — Other Ambulatory Visit

## 2024-05-18 ENCOUNTER — Other Ambulatory Visit

## 2024-05-20 NOTE — Assessment & Plan Note (Signed)
 hgba1c unacceptable but patient not willing to start new meds at this time, she will minimize simple carbs. Increase exercise as tolerated. Continue current meds

## 2024-05-20 NOTE — Assessment & Plan Note (Signed)
Encourage heart healthy diet such as MIND or DASH diet, increase exercise, avoid trans fats, simple carbohydrates and processed foods, consider a krill or fish or flaxseed oil cap daily.  Patient continues to decline medications.  

## 2024-05-20 NOTE — Assessment & Plan Note (Signed)
 Encouraged complete cessation. Discussed need to quit as relates to risk of numerous cancers, cardiac and pulmonary disease as well as neurologic complications. Counseled for greater than 3 minutes. Is up past a PPD some days

## 2024-05-20 NOTE — Assessment & Plan Note (Signed)
 Encouraged DASH or MIND diet, decrease po intake and increase exercise as tolerated. Needs 7-8 hours of sleep nightly. Avoid trans fats, eat small, frequent meals every 4-5 hours with lean proteins, complex carbs and healthy fats. Minimize simple carbs, high fat foods and processed foods

## 2024-05-21 ENCOUNTER — Other Ambulatory Visit

## 2024-05-21 DIAGNOSIS — E1169 Type 2 diabetes mellitus with other specified complication: Secondary | ICD-10-CM

## 2024-05-21 DIAGNOSIS — R002 Palpitations: Secondary | ICD-10-CM | POA: Diagnosis not present

## 2024-05-21 DIAGNOSIS — E782 Mixed hyperlipidemia: Secondary | ICD-10-CM

## 2024-05-21 DIAGNOSIS — E669 Obesity, unspecified: Secondary | ICD-10-CM | POA: Diagnosis not present

## 2024-05-21 LAB — COMPREHENSIVE METABOLIC PANEL WITH GFR
ALT: 19 U/L (ref 0–35)
AST: 19 U/L (ref 0–37)
Albumin: 4.3 g/dL (ref 3.5–5.2)
Alkaline Phosphatase: 56 U/L (ref 39–117)
BUN: 11 mg/dL (ref 6–23)
CO2: 29 meq/L (ref 19–32)
Calcium: 9.5 mg/dL (ref 8.4–10.5)
Chloride: 101 meq/L (ref 96–112)
Creatinine, Ser: 0.9 mg/dL (ref 0.40–1.20)
GFR: 68.27 mL/min (ref >60.00)
Glucose, Bld: 261 mg/dL — ABNORMAL HIGH (ref 70–99)
Potassium: 4.5 meq/L (ref 3.5–5.1)
Sodium: 138 meq/L (ref 135–145)
Total Bilirubin: 0.5 mg/dL (ref 0.2–1.2)
Total Protein: 7 g/dL (ref 6.0–8.3)

## 2024-05-21 LAB — CBC WITH DIFFERENTIAL/PLATELET
Basophils Absolute: 0 K/uL (ref 0.0–0.1)
Basophils Relative: 0.4 % (ref 0.0–3.0)
Eosinophils Absolute: 0.1 K/uL (ref 0.0–0.7)
Eosinophils Relative: 0.7 % (ref 0.0–5.0)
HCT: 44.6 % (ref 36.0–46.0)
Hemoglobin: 14.9 g/dL (ref 12.0–15.0)
Lymphocytes Relative: 25.1 % (ref 12.0–46.0)
Lymphs Abs: 1.8 K/uL (ref 0.7–4.0)
MCHC: 33.5 g/dL (ref 30.0–36.0)
MCV: 92 fl (ref 78.0–100.0)
Monocytes Absolute: 0.4 K/uL (ref 0.1–1.0)
Monocytes Relative: 5.8 % (ref 3.0–12.0)
Neutro Abs: 4.9 K/uL (ref 1.4–7.7)
Neutrophils Relative %: 68 % (ref 43.0–77.0)
Platelets: 257 K/uL (ref 150.0–400.0)
RBC: 4.85 Mil/uL (ref 3.87–5.11)
RDW: 12.7 % (ref 11.5–15.5)
WBC: 7.3 K/uL (ref 4.0–10.5)

## 2024-05-21 LAB — HEMOGLOBIN A1C: Hgb A1c MFr Bld: 10.2 % — ABNORMAL HIGH (ref 4.6–6.5)

## 2024-05-21 LAB — MICROALBUMIN / CREATININE URINE RATIO
Creatinine,U: 177.4 mg/dL
Microalb Creat Ratio: 7.9 mg/g (ref 0.0–30.0)
Microalb, Ur: 1.4 mg/dL (ref 0.0–1.9)

## 2024-05-21 LAB — LIPID PANEL
Cholesterol: 233 mg/dL — ABNORMAL HIGH (ref 0–200)
HDL: 65.1 mg/dL (ref >39.00)
LDL Cholesterol: 125 mg/dL — ABNORMAL HIGH (ref 0–99)
NonHDL: 168.37
Total CHOL/HDL Ratio: 4
Triglycerides: 218 mg/dL — ABNORMAL HIGH (ref 0.0–149.0)
VLDL: 43.6 mg/dL — ABNORMAL HIGH (ref 0.0–40.0)

## 2024-05-21 LAB — TSH: TSH: 1.51 u[IU]/mL (ref 0.35–5.50)

## 2024-05-23 NOTE — Progress Notes (Signed)
 Patient reviewed via MyChart.

## 2024-05-24 ENCOUNTER — Encounter: Payer: Self-pay | Admitting: Family Medicine

## 2024-05-24 ENCOUNTER — Ambulatory Visit: Admitting: Family Medicine

## 2024-05-24 VITALS — BP 126/86 | HR 90 | Temp 98.1°F | Resp 16 | Ht 62.0 in | Wt 170.8 lb

## 2024-05-24 DIAGNOSIS — E1169 Type 2 diabetes mellitus with other specified complication: Secondary | ICD-10-CM

## 2024-05-24 DIAGNOSIS — E66811 Obesity, class 1: Secondary | ICD-10-CM

## 2024-05-24 DIAGNOSIS — E669 Obesity, unspecified: Secondary | ICD-10-CM

## 2024-05-24 DIAGNOSIS — F17200 Nicotine dependence, unspecified, uncomplicated: Secondary | ICD-10-CM | POA: Diagnosis not present

## 2024-05-24 DIAGNOSIS — E119 Type 2 diabetes mellitus without complications: Secondary | ICD-10-CM | POA: Diagnosis not present

## 2024-05-24 DIAGNOSIS — E782 Mixed hyperlipidemia: Secondary | ICD-10-CM

## 2024-05-24 DIAGNOSIS — K047 Periapical abscess without sinus: Secondary | ICD-10-CM

## 2024-05-24 MED ORDER — SERTRALINE HCL 100 MG PO TABS: 150.0000 mg | ORAL_TABLET | Freq: Every day | ORAL | 1 refills | Status: DC

## 2024-05-24 MED ORDER — METFORMIN HCL 500 MG PO TABS: 500.0000 mg | ORAL_TABLET | Freq: Two times a day (BID) | ORAL | 0 refills | Status: DC

## 2024-05-24 NOTE — Assessment & Plan Note (Signed)
 Had to have her tooth removed from lower left jaw with dental and has had significant pain, swelling and debility as a result

## 2024-05-24 NOTE — Progress Notes (Signed)
 Subjective:    Patient ID: Ashley Jackson, female    DOB: 1960-11-10, 63 y.o.   MRN: [id number]  Chief Complaint  Patient presents with  . Medical Management of Chronic Issues    Patient presents today for a 4 month follow-up.  . Quality Metric Gaps    Pneumococcal vaccine    HPI Discussed the use of AI scribe software for clinical note transcription with the patient, who gave verbal consent to proceed.  History of Present Illness Ashley Jackson is a 63 year old female with type 2 diabetes who presents with anxiety and dental issues following a tooth extraction.  She has been experiencing significant anxiety related to her health and work situation, exacerbated by recent dental issues and concerns about her blood sugar levels. She has been out of work since September 11th due to a dental abscess and plans to return to work tomorrow, despite feeling anxious about the situation.  On September 11th, she began experiencing a toothache, which she initially ignored, leading to an abscess. She was unable to see her dentist for five days and was in excruciating pain during this time. The tooth was extracted on September 17th, but she experienced significant pain during the procedure as the area was not fully numb. She received five shots of Novocaine, which made her feel very unwell. Post-extraction, she continues to experience discomfort, describing a fluttering sensation in her gums, which she attributes to nerve irritation from the severe abscess.  Her blood sugar levels have been high, which she attributes to her diet, including consuming sweets like a thing of cookies every two or three days. She is concerned about increasing her metformin  dose due to past allergic reactions to medications.  She smokes approximately a pack of cigarettes a day, which has increased due to stress, whereas she typically smokes less. She is allergic to alcohol and does not consume it.  During the review of symptoms,  she reports ongoing dental discomfort and some swelling in the gums, which she expects will take time to resolve due to the severity of the infection.    Past Medical History:  Diagnosis Date  . Allergic state 01/24/2017  . Anxiety state, unspecified 07/12/2007  . COLONIC POLYPS, HX OF 05/02/2007  . DEPRESSION 07/12/2007  . DIABETES MELLITUS, TYPE II, UNCONTROLLED 02/25/2010  . EXOGENOUS OBESITY 11/07/2007  . Hematuria 06/03/2017  . HYPERGLYCEMIA 07/12/2007  . HYPERLIPIDEMIA 02/07/2008  . Low back pain 04/29/2013  . Overweight(278.02) 06/24/2010  . Palpitations 01/20/2016  . Preventative health care 04/29/2013  . Tobacco abuse 10/24/2012  . TOBACCO USER 06/24/2010    Past Surgical History:  Procedure Laterality Date  . COLONOSCOPY  2006    Family History  Problem Relation Age of Onset  . Liver cancer Mother   . Hypertension Other   . Hypertension Father   . Stroke Brother   . Seizures Son   . Diabetes Maternal Uncle   . Breast cancer Neg Hx   . Prostate cancer Neg Hx   . Heart disease Neg Hx     Social History   Socioeconomic History  . Marital status: Divorced    Spouse name: Not on file  . Number of children: Not on file  . Years of education: Not on file  . Highest education level: Associate degree: academic program  Occupational History  . Not on file  Tobacco Use  . Smoking status: Every Day    Current packs/day: 1.00    Types:  Cigarettes  . Smokeless tobacco: Never  Vaping Use  . Vaping status: Never Used  Substance and Sexual Activity  . Alcohol use: Not Currently  . Drug use: No  . Sexual activity: Not Currently  Other Topics Concern  . Not on file  Social History Narrative  . Not on file   Social Drivers of Health   Financial Resource Strain: Low Risk  (05/17/2024)   Overall Financial Resource Strain (CARDIA)   . Difficulty of Paying Living Expenses: Not very hard  Food Insecurity: No Food Insecurity (05/17/2024)   Hunger Vital Sign   . Worried About  Programme researcher, broadcasting/film/video in the Last Year: Never true   . Ran Out of Food in the Last Year: Never true  Transportation Needs: Patient Declined (05/17/2024)   PRAPARE - Transportation   . Lack of Transportation (Medical): Patient declined   . Lack of Transportation (Non-Medical): Patient declined  Physical Activity: Insufficiently Active (11/09/2023)   Exercise Vital Sign   . Days of Exercise per Week: 2 days   . Minutes of Exercise per Session: 20 min  Stress: Stress Concern Present (11/09/2023)   [employer] of Occupational Health - Occupational Stress Questionnaire   . Feeling of Stress : To some extent  Social Connections: Socially Isolated (11/09/2023)   Social Connection and Isolation Panel   . Frequency of Communication with Friends and Family: More than three times a week   . Frequency of Social Gatherings with Friends and Family: Once a week   . Attends Religious Services: Never   . Active Member of Clubs or Organizations: No   . Attends Banker Meetings: Not on file   . Marital Status: Divorced  Intimate Partner Violence: Unknown (11/26/2021)   Received from [facility]   HITS   . Physically Hurt: Not on file   . Insult or Talk Down To: Not on file   . Threaten Physical Harm: Not on file   . Scream or Curse: Not on file    Outpatient Medications Prior to Visit  Medication Sig Dispense Refill  . ALPRAZolam  (XANAX ) 0.5 MG tablet TAKE 1 TABLET (0.5 MG TOTAL) BY MOUTH 4 (FOUR) TIMES DAILY AS NEEDED FOR ANXIETY. 120 tablet 3  . atorvastatin  (LIPITOR) 10 MG tablet Take 1 tablet (10 mg total) by mouth at bedtime. 90 tablet 0  . Blood Glucose Calibration (ONETOUCH ULTRA CONTROL) LIQD 1 DROP BY DOES NOT APPLY ROUTE EVERY 30 (THIRTY) DAYS. TO CHECK ONE TOUCH ULTRA 2 CALIBRATION 30 each 3  . Blood Glucose Monitoring Suppl (ONE TOUCH ULTRA 2) w/Device KIT Check blood sugar once daily. Dx. Code E11.69 1 kit 0  . Lancets (ONETOUCH DELICA PLUS LANCET33G) MISC CHECK BLOOD SUGAR  ONCE DAILY. DX. CODE E11.69 100 each 12  . metFORMIN  (GLUCOPHAGE ) 500 MG tablet Take 1 tablet (500 mg total) by mouth 2 (two) times daily with a meal. 180 tablet 0  . ONETOUCH ULTRA TEST test strip CHECK BLOOD SUGAR ONCE DAILY. DX. CODE E11.69 50 strip 3  . sertraline  (ZOLOFT ) 100 MG tablet TAKE 1 TABLET BY MOUTH EVERY DAY 90 tablet 2   No facility-administered medications prior to visit.    Allergies  Allergen Reactions  . Atarax [Hydroxyzine] Shortness Of Breath    Anxiety, shortness of breath, tachycardia   . Cortisone     HEART RACING  . Levofloxacin     anxiety  . Sulfa Antibiotics     Panic attacks  . Sulfonamide Derivatives  REACTION: Panic attacks  . Levofloxacin Anxiety    Review of Systems  Constitutional:  Positive for malaise/fatigue. Negative for fever.  HENT:  Positive for congestion.   Eyes:  Negative for blurred vision.  Respiratory:  Negative for shortness of breath.   Cardiovascular:  Negative for chest pain, palpitations and leg swelling.  Gastrointestinal:  Negative for abdominal pain, blood in stool and nausea.  Genitourinary:  Negative for dysuria and frequency.  Musculoskeletal:  Positive for myalgias. Negative for falls.  Skin:  Negative for rash.  Neurological:  Negative for dizziness, loss of consciousness and headaches.  Endo/Heme/Allergies:  Negative for environmental allergies.  Psychiatric/Behavioral:  Positive for depression.        Objective:    Physical Exam Constitutional:      General: She is not in acute distress.    Appearance: Normal appearance. She is well-developed. She is not toxic-appearing.  HENT:     Head: Normocephalic and atraumatic.     Comments: Mild left facial swelling    Right Ear: External ear normal.     Left Ear: External ear normal.     Nose: Nose normal.  Eyes:     General:        Right eye: No discharge.        Left eye: No discharge.     Conjunctiva/sclera: Conjunctivae normal.  Neck:     Thyroid :  No thyromegaly.  Cardiovascular:     Rate and Rhythm: Normal rate and regular rhythm.     Heart sounds: Normal heart sounds. No murmur heard. Pulmonary:     Effort: Pulmonary effort is normal. No respiratory distress.     Breath sounds: Normal breath sounds.  Abdominal:     General: Bowel sounds are normal.     Palpations: Abdomen is soft.     Tenderness: There is no abdominal tenderness. There is no guarding.  Musculoskeletal:        General: Normal range of motion.     Cervical back: Neck supple.  Lymphadenopathy:     Cervical: No cervical adenopathy.  Skin:    General: Skin is warm and dry.  Neurological:     Mental Status: She is alert and oriented to person, place, and time.  Psychiatric:        Mood and Affect: Mood normal.        Behavior: Behavior normal.        Thought Content: Thought content normal.        Judgment: Judgment normal.     BP 126/86   Pulse 90   Temp 98.1 F (36.7 C)   Resp 16   Ht 5' 2 (1.575 m)   Wt 170 lb 12.8 oz (77.5 kg)   LMP 12/09/2015   SpO2 98%   BMI 31.24 kg/m  Wt Readings from Last 3 Encounters:  05/24/24 170 lb 12.8 oz (77.5 kg)  01/06/24 170 lb 6.4 oz (77.3 kg)  11/14/23 168 lb 6.4 oz (76.4 kg)    Diabetic Foot Exam - Simple   No data filed    Lab Results  Component Value Date   WBC 7.3 05/21/2024   HGB 14.9 05/21/2024   HCT 44.6 05/21/2024   PLT 257.0 05/21/2024   GLUCOSE 261 (H) 05/21/2024   CHOL 233 (H) 05/21/2024   TRIG 218.0 (H) 05/21/2024   HDL 65.10 05/21/2024   LDLDIRECT 126.0 03/25/2022   LDLCALC 125 (H) 05/21/2024   ALT 19 05/21/2024   AST 19 05/21/2024  NA 138 05/21/2024   K 4.5 05/21/2024   CL 101 05/21/2024   CREATININE 0.90 05/21/2024   BUN 11 05/21/2024   CO2 29 05/21/2024   TSH 1.51 05/21/2024   HGBA1C 10.2 (H) 05/21/2024   MICROALBUR 1.4 05/21/2024    Lab Results  Component Value Date   TSH 1.51 05/21/2024   Lab Results  Component Value Date   WBC 7.3 05/21/2024   HGB 14.9  05/21/2024   HCT 44.6 05/21/2024   MCV 92.0 05/21/2024   PLT 257.0 05/21/2024   Lab Results  Component Value Date   NA 138 05/21/2024   K 4.5 05/21/2024   CO2 29 05/21/2024   GLUCOSE 261 (H) 05/21/2024   BUN 11 05/21/2024   CREATININE 0.90 05/21/2024   BILITOT 0.5 05/21/2024   ALKPHOS 56 05/21/2024   AST 19 05/21/2024   ALT 19 05/21/2024   PROT 7.0 05/21/2024   ALBUMIN 4.3 05/21/2024   CALCIUM  9.5 05/21/2024   GFR 68.27 05/21/2024   Lab Results  Component Value Date   CHOL 233 (H) 05/21/2024   Lab Results  Component Value Date   HDL 65.10 05/21/2024   Lab Results  Component Value Date   LDLCALC 125 (H) 05/21/2024   Lab Results  Component Value Date   TRIG 218.0 (H) 05/21/2024   Lab Results  Component Value Date   CHOLHDL 4 05/21/2024   Lab Results  Component Value Date   HGBA1C 10.2 (H) 05/21/2024       Assessment & Plan:  Hyperlipidemia, mixed Assessment & Plan: Encourage heart healthy diet such as MIND or DASH diet, increase exercise, avoid trans fats, simple carbohydrates and processed foods, consider a krill or fish or flaxseed oil cap daily.  Patient continues to decline medications.    Obesity (BMI 30.0-34.9) Assessment & Plan: Encouraged DASH or MIND diet, decrease po intake and increase exercise as tolerated. Needs 7-8 hours of sleep nightly. Avoid trans fats, eat small, frequent meals every 4-5 hours with lean proteins, complex carbs and healthy fats. Minimize simple carbs, high fat foods and processed foods    Type 2 diabetes mellitus with obesity Assessment & Plan: hgba1c unacceptable but patient not willing to start new meds at this time, she will minimize simple carbs. Increase exercise as tolerated. Continue current meds    TOBACCO USER Assessment & Plan: Encouraged complete cessation. Discussed need to quit as relates to risk of numerous cancers, cardiac and pulmonary disease as well as neurologic complications. Counseled for greater  than 3 minutes. Is up past a PPD some days     Assessment and Plan Assessment & Plan Dental abscess, status post extraction, lower left jaw Severe dental abscess in the lower left jaw, status post extraction on May 09, 2024. Persistent pain and swelling likely due to nerve irritation and inflammation from the infection. No antibiotics were initially prescribed by the dentist, but she took them anyway. The infection has contributed to increased stress and anxiety. - Encourage rest and hydration. - Follow up with dentist if symptoms persist.  Type 2 diabetes mellitus Poorly controlled Type 2 diabetes mellitus with a hemoglobin A1c over 10. The recent dental abscess and associated stress have exacerbated blood sugar levels. Current management includes metformin , but dosage is insufficient. Discussed the need to manage blood sugar to prevent future complications such as heart disease and dementia. - Increase metformin  to 500 mg twice daily, starting with 500 mg once daily for a week to assess tolerance. - Monitor blood  glucose levels twice a week, once fasting and once postprandial. - Consider referral to pharmacist for cost-effective GLP-1 or other medication options if metformin  adjustment is insufficient.  Anxiety disorder Anxiety exacerbated by recent dental issues, work-related stress, and financial concerns. Current treatment includes sertraline  (Zoloft ) 100 mg daily, but symptoms persist. Discussed increasing sertraline  to help manage anxiety and the potential need for counseling if symptoms continue to impact daily functioning. - Increase sertraline  to 150 mg daily by taking 1.5 pills of the 100 mg tablet. - Monitor anxiety symptoms and adjust treatment as needed. - Consider counseling if anxiety continues to impact daily functioning.  Recording duration: 33 minutes     Harlene Horton, MD

## 2024-05-25 ENCOUNTER — Encounter: Payer: Self-pay | Admitting: Family Medicine

## 2024-05-25 DIAGNOSIS — E119 Type 2 diabetes mellitus without complications: Secondary | ICD-10-CM

## 2024-05-28 ENCOUNTER — Telehealth: Payer: Self-pay | Admitting: *Deleted

## 2024-05-28 NOTE — Telephone Encounter (Addendum)
 Paperwork faxed to [facility] and confirmation received.  Copy sent to scan and copy emailed to patient.

## 2024-05-30 NOTE — Telephone Encounter (Signed)
 I messaged patient and advised that we do not have any openings.  Not sure what to do with this patient as it seems she may not want to go back work.

## 2024-05-31 ENCOUNTER — Telehealth: Admitting: Family Medicine

## 2024-05-31 ENCOUNTER — Encounter: Payer: Self-pay | Admitting: Family Medicine

## 2024-05-31 DIAGNOSIS — F064 Anxiety disorder due to known physiological condition: Secondary | ICD-10-CM

## 2024-05-31 DIAGNOSIS — E11649 Type 2 diabetes mellitus with hypoglycemia without coma: Secondary | ICD-10-CM | POA: Diagnosis not present

## 2024-06-02 ENCOUNTER — Other Ambulatory Visit: Payer: Self-pay | Admitting: Family Medicine

## 2024-06-03 ENCOUNTER — Encounter: Payer: Self-pay | Admitting: Family Medicine

## 2024-06-03 NOTE — Progress Notes (Signed)
 MyChart Video Visit    Virtual Visit via Video Note   This patient is at least at moderate risk for complications without adequate follow up. This format is felt to be most appropriate for this patient at this time. Physical exam was limited by quality of the video and audio technology used for the visit. Harlene, CMA was able to get the patient set up on a video visit.  Patient location: home Patient and provider in visit Provider location: Office  I discussed the limitations of evaluation and management by telemedicine and the availability of in person appointments. The patient expressed understanding and agreed to proceed.  Visit Date: 05/31/2024  Today's healthcare provider: Harlene Horton, MD     Subjective:    Patient ID: Ashley Jackson, female    DOB: 08-09-1961, 63 y.o.   MRN: [id number]  Chief Complaint  Patient presents with   Follow-up    Panic attacks and sugar at 140 I start shaking onset 05/25/2024 It between 4-5 in  the morning, I completely quit sugar cold [place] on 05/23/2024. I havent started the Metformin  Im scared    HPI Discussed the use of AI scribe software for clinical note transcription with the patient, who gave verbal consent to proceed.  History of Present Illness Ashley Jackson is a 62 year old female with diabetes who presents with episodes of shaking and low blood sugar symptoms in the early morning.  She experiences episodes of shaking and low blood sugar symptoms between 4 and 5 AM, prompting her to drink orange juice. During these episodes, her blood sugar readings are around 143 mg/dL, which she perceives as low due to her body's adjustment to previously higher levels. She describes a sensation of panic during these episodes, attributing it to her body's response to lower blood sugar levels.  She has a history of consuming high amounts of carbohydrates and sweets, including ordering Door Dash, cookies, and ice cream, which she has recently  stopped. She describes experiencing 'sugar withdrawal' and is unsure if her symptoms are due to low sugar or panic attacks. She has not been taking metformin  due to fear of further lowering her blood sugar.  She is trying to improve her diet by eating more protein, such as chicken, beef, eggs, and cottage cheese, and is considering incorporating small amounts of carbohydrates.  Her blood sugar can rise to 173-181 mg/dL after consuming orange juice to counteract the low sugar symptoms. She is concerned about her overall health and the impact of her dietary changes.  She is currently taking sertraline .    Past Medical History:  Diagnosis Date   Allergic state 01/24/2017   Anxiety state, unspecified 07/12/2007   COLONIC POLYPS, HX OF 05/02/2007   Dental abscess 05/24/2024   DEPRESSION 07/12/2007   DIABETES MELLITUS, TYPE II, UNCONTROLLED 02/25/2010   EXOGENOUS OBESITY 11/07/2007   Hematuria 06/03/2017   HYPERGLYCEMIA 07/12/2007   HYPERLIPIDEMIA 02/07/2008   Low back pain 04/29/2013   Overweight(278.02) 06/24/2010   Palpitations 01/20/2016   Preventative health care 04/29/2013   Tobacco abuse 10/24/2012   TOBACCO USER 06/24/2010    Past Surgical History:  Procedure Laterality Date   COLONOSCOPY  2006    Family History  Problem Relation Age of Onset   Liver cancer Mother    Hypertension Other    Hypertension Father    Stroke Brother    Seizures Son    Diabetes Maternal Uncle    Breast cancer Neg Hx  Prostate cancer Neg Hx    Heart disease Neg Hx     Social History   Socioeconomic History   Marital status: Divorced    Spouse name: Not on file   Number of children: Not on file   Years of education: Not on file   Highest education level: Associate degree: academic program  Occupational History   Not on file  Tobacco Use   Smoking status: Every Day    Current packs/day: 1.00    Types: Cigarettes   Smokeless tobacco: Never  Vaping Use   Vaping status: Never  Used  Substance and Sexual Activity   Alcohol use: Not Currently   Drug use: No   Sexual activity: Not Currently  Other Topics Concern   Not on file  Social History Narrative   Not on file   Social Drivers of Health   Financial Resource Strain: Low Risk  (05/17/2024)   Overall Financial Resource Strain (CARDIA)    Difficulty of Paying Living Expenses: Not very hard  Food Insecurity: No Food Insecurity (05/17/2024)   Hunger Vital Sign    Worried About Running Out of Food in the Last Year: Never true    Ran Out of Food in the Last Year: Never true  Transportation Needs: Patient Declined (05/17/2024)   PRAPARE - Transportation    Lack of Transportation (Medical): Patient declined    Lack of Transportation (Non-Medical): Patient declined  Physical Activity: Insufficiently Active (11/09/2023)   Exercise Vital Sign    Days of Exercise per Week: 2 days    Minutes of Exercise per Session: 20 min  Stress: Stress Concern Present (11/09/2023)   [employer] of Occupational Health - Occupational Stress Questionnaire    Feeling of Stress : To some extent  Social Connections: Socially Isolated (11/09/2023)   Social Connection and Isolation Panel    Frequency of Communication with Friends and Family: More than three times a week    Frequency of Social Gatherings with Friends and Family: Once a week    Attends Religious Services: Never    Database administrator or Organizations: No    Attends Engineer, structural: Not on file    Marital Status: Divorced  Intimate Partner Violence: Unknown (11/26/2021)   Received from [facility]   HITS    Physically Hurt: Not on file    Insult or Talk Down To: Not on file    Threaten Physical Harm: Not on file    Scream or Curse: Not on file    Outpatient Medications Prior to Visit  Medication Sig Dispense Refill   ALPRAZolam  (XANAX ) 0.5 MG tablet TAKE 1 TABLET (0.5 MG TOTAL) BY MOUTH 4 (FOUR) TIMES DAILY AS NEEDED FOR ANXIETY. 120 tablet 3    atorvastatin  (LIPITOR) 10 MG tablet Take 1 tablet (10 mg total) by mouth at bedtime. 90 tablet 0   Blood Glucose Calibration (ONETOUCH ULTRA CONTROL) LIQD 1 DROP BY DOES NOT APPLY ROUTE EVERY 30 (THIRTY) DAYS. TO CHECK ONE TOUCH ULTRA 2 CALIBRATION 30 each 3   Blood Glucose Monitoring Suppl (ONE TOUCH ULTRA 2) w/Device KIT Check blood sugar once daily. Dx. Code E11.69 1 kit 0   Lancets (ONETOUCH DELICA PLUS LANCET33G) MISC CHECK BLOOD SUGAR ONCE DAILY. DX. CODE E11.69 100 each 12   metFORMIN  (GLUCOPHAGE ) 500 MG tablet Take 1 tablet (500 mg total) by mouth 2 (two) times daily with a meal. (Patient not taking: Reported on 05/31/2024) 180 tablet 0   ONETOUCH ULTRA TEST test  strip CHECK BLOOD SUGAR ONCE DAILY. DX. CODE E11.69 50 strip 3   sertraline  (ZOLOFT ) 100 MG tablet Take 1.5 tablets (150 mg total) by mouth daily. 135 tablet 1   No facility-administered medications prior to visit.    Allergies  Allergen Reactions   Atarax [Hydroxyzine] Shortness Of Breath    Anxiety, shortness of breath, tachycardia    Cortisone     HEART RACING   Levofloxacin     anxiety   Sulfa Antibiotics     Panic attacks   Sulfonamide Derivatives     REACTION: Panic attacks   Levofloxacin Anxiety    Review of Systems  Constitutional:  Positive for malaise/fatigue. Negative for fever.  HENT:  Negative for congestion.   Eyes:  Negative for blurred vision.  Respiratory:  Negative for shortness of breath.   Cardiovascular:  Positive for palpitations. Negative for chest pain and leg swelling.  Gastrointestinal:  Positive for nausea. Negative for abdominal pain and blood in stool.  Genitourinary:  Negative for dysuria and frequency.  Musculoskeletal:  Negative for falls.  Skin:  Negative for rash.  Neurological:  Positive for dizziness. Negative for tremors, loss of consciousness and headaches.  Endo/Heme/Allergies:  Negative for environmental allergies.  Psychiatric/Behavioral:  Positive for depression. The  patient is nervous/anxious.        Objective:    Physical Exam Constitutional:      General: She is not in acute distress.    Appearance: Normal appearance. She is not ill-appearing or toxic-appearing.  HENT:     Head: Normocephalic and atraumatic.     Right Ear: External ear normal.     Left Ear: External ear normal.     Nose: Nose normal.  Eyes:     General:        Right eye: No discharge.        Left eye: No discharge.  Pulmonary:     Effort: Pulmonary effort is normal.  Skin:    Findings: No rash.  Neurological:     Mental Status: She is alert and oriented to person, place, and time.  Psychiatric:        Behavior: Behavior normal.     Ht 5' 2 (1.575 m)   LMP 12/09/2015   BMI 31.24 kg/m  Wt Readings from Last 3 Encounters:  05/24/24 170 lb 12.8 oz (77.5 kg)  01/06/24 170 lb 6.4 oz (77.3 kg)  11/14/23 168 lb 6.4 oz (76.4 kg)       Assessment & Plan:  There are no diagnoses linked to this encounter.   Assessment and Plan Assessment & Plan Type 2 diabetes mellitus with dietary adjustment and hypoglycemic symptoms Experiencing hypoglycemic symptoms in early morning due to body's adjustment to lower blood sugar levels after dietary changes. Body accustomed to higher glucose levels, causing panic response at 140 mg/dL, perceived as low. Sugar withdrawal symptoms present after stopping high sugar intake. Hesitant to take metformin  due to fear of further lowering blood sugar. - Advise eating protein every 3-4 hours to stabilize blood sugar levels. - Recommend a high-protein bedtime snack with a small amount of carbohydrates. - Allow moderate intake of sweets, such as half a cup of ice cream, as part of a balanced diet. - Suggest pairing orange juice with protein to slow sugar absorption and maintain levels. - Consider referral to a nutritionist for a detailed meal plan if needed. - Monitor blood sugar levels and report back with results. - Aim for blood sugar levels  between  150-200 mg/dL initially, avoiding levels of 250-350 mg/dL. - Advise against intermittent fasting due to risk of hypoglycemia. - Consider a small protein-rich snack in the middle of the night if needed.  Anxiety disorder Anxiety exacerbated by hypoglycemic symptoms and fear of waking up with low blood sugar. Considering increasing sertraline  dosage to manage anxiety better. - Consider increasing sertraline  dosage from 100 mg to 125 mg or 150 mg daily. - Monitor anxiety symptoms and adjust treatment as needed. - Discuss the possibility of extending time off work if anxiety and symptoms persist.  Recording duration: 20 minutes     I discussed the assessment and treatment plan with the patient. The patient was provided an opportunity to ask questions and all were answered. The patient agreed with the plan and demonstrated an understanding of the instructions.   The patient was advised to call back or seek an in-person evaluation if the symptoms worsen or if the condition fails to improve as anticipated.  Harlene Horton, MD [facility] at [facility] [phone] (phone) [phone] (fax)  [facility]

## 2024-06-05 MED ORDER — SERTRALINE HCL 100 MG PO TABS: 100.0000 mg | ORAL_TABLET | Freq: Every day | ORAL | 1 refills | Status: AC

## 2024-06-05 MED ORDER — ONETOUCH DELICA PLUS LANCET33G MISC: 12 refills | Status: DC

## 2024-06-05 MED ORDER — SERTRALINE HCL 25 MG PO TABS: 25.0000 mg | ORAL_TABLET | Freq: Every day | ORAL | 1 refills | Status: AC

## 2024-06-05 MED ORDER — ACCU-CHEK GUIDE TEST VI STRP: ORAL_STRIP | 12 refills | Status: DC

## 2024-06-05 NOTE — Telephone Encounter (Unsigned)
 Copied from CRM [id number]. Topic: Clinical - Medication Question >> Jun 05, 2024  1:05 PM Paige D wrote: Reason for CRM: Pt is calling in regards to medication Sertaline 150 mg take 1.5 tablet daily please call pt back as she is confused and says 1.5 tablet is not 125 MG. Please reach out to pt in regards to this asap.

## 2024-06-05 NOTE — Addendum Note (Signed)
 Addended by: Jamya Starry D on: 06/05/2024 01:13 PM   Modules accepted: Orders

## 2024-06-05 NOTE — Telephone Encounter (Signed)
 Spoke with patient and she stated she would like to take 125mg .  Rx sent in for 100mg  and 25mg  of Zoloft .  She still does not want to go back to work and will be checking with her job on what she needs to do about paperwork.  She also would like to know how many carbs you recommend.  She stated in the many messages how her sugars have been running and how she has been doing.

## 2024-06-07 MED ORDER — ACCU-CHEK GUIDE W/DEVICE KIT: PACK | 0 refills | Status: AC

## 2024-06-07 MED ORDER — ACCU-CHEK GUIDE TEST VI STRP: ORAL_STRIP | 1 refills | Status: DC

## 2024-06-07 MED ORDER — ACCU-CHEK SOFTCLIX LANCETS MISC: 1 refills | Status: AC

## 2024-06-08 ENCOUNTER — Encounter: Payer: Self-pay | Admitting: *Deleted

## 2024-06-11 MED ORDER — ACCU-CHEK GUIDE CONTROL VI LIQD: 0 refills | Status: AC

## 2024-06-11 NOTE — Addendum Note (Signed)
 Addended by: ESTELLE GILLIS D on: 06/11/2024 01:53 PM   Modules accepted: Orders

## 2024-06-12 ENCOUNTER — Other Ambulatory Visit: Payer: Self-pay | Admitting: *Deleted

## 2024-06-18 NOTE — Addendum Note (Signed)
 Addended by: ESTELLE GILLIS D on: 06/18/2024 10:59 AM   Modules accepted: Orders

## 2024-06-22 NOTE — Telephone Encounter (Signed)
 Please change referral location to Dr.Kerr at [facility] for Endo

## 2024-06-24 NOTE — Assessment & Plan Note (Signed)
 Patient struggling with high levels of anxiety and suffering from unpredictable panic attacks. She is taking her Sertraline  regularly and it does help some but she can have several days a week when she is unable to function. She continues to struggle

## 2024-06-24 NOTE — Assessment & Plan Note (Signed)
 Encouraged complete cessation. Discussed need to quit as relates to risk of numerous cancers, cardiac and pulmonary disease as well as neurologic complications. Counseled for greater than 3 minutes. Is up past a PPD some days

## 2024-06-24 NOTE — Assessment & Plan Note (Signed)
 Encouraged DASH or MIND diet, decrease po intake and increase exercise as tolerated. Needs 7-8 hours of sleep nightly. Avoid trans fats, eat small, frequent meals every 4-5 hours with lean proteins, complex carbs and healthy fats. Minimize simple carbs, high fat foods and processed foods

## 2024-06-24 NOTE — Assessment & Plan Note (Signed)
 [id number] acceptable, minimize simple carbs. Increase exercise as tolerated. Continue current meds

## 2024-06-24 NOTE — Assessment & Plan Note (Signed)
Encourage heart healthy diet such as MIND or DASH diet, increase exercise, avoid trans fats, simple carbohydrates and processed foods, consider a krill or fish or flaxseed oil cap daily.  Patient continues to decline medications.  

## 2024-06-28 ENCOUNTER — Ambulatory Visit: Admitting: Family Medicine

## 2024-06-28 ENCOUNTER — Encounter: Payer: Self-pay | Admitting: Family Medicine

## 2024-06-28 VITALS — BP 128/84 | HR 80 | Temp 97.9°F | Resp 16 | Ht 62.0 in | Wt 168.4 lb

## 2024-06-28 DIAGNOSIS — F418 Other specified anxiety disorders: Secondary | ICD-10-CM

## 2024-06-28 DIAGNOSIS — E66811 Obesity, class 1: Secondary | ICD-10-CM | POA: Diagnosis not present

## 2024-06-28 DIAGNOSIS — E119 Type 2 diabetes mellitus without complications: Secondary | ICD-10-CM

## 2024-06-28 DIAGNOSIS — E782 Mixed hyperlipidemia: Secondary | ICD-10-CM | POA: Diagnosis not present

## 2024-06-28 DIAGNOSIS — F17200 Nicotine dependence, unspecified, uncomplicated: Secondary | ICD-10-CM

## 2024-06-28 MED ORDER — ACCU-CHEK GUIDE TEST VI STRP: ORAL_STRIP | 5 refills | Status: DC

## 2024-06-28 NOTE — Patient Instructions (Signed)
 Hypoglycemia Hypoglycemia is when the amount of sugar, or glucose, in your blood is too low. Low blood sugar can happen if you have diabetes or if you don't have diabetes. It may be an emergency. What are the causes? Low blood sugar happens most often in people who have diabetes. It may be caused by: Diabetes medicine. Not eating enough, or not eating often enough. Being more active than normal. If you don't have diabetes, you may still get low blood sugar if: There's a tumor in your pancreas. A tumor is a growth of cells that isn't normal. You don't eat enough, or you fast. Fasting is when you don't eat for long periods at a time. You have a bad infection or illness. You have problems after weight loss surgery. You have kidney or liver problems. You take certain medicines. What increases the risk? You're more likely to have low blood sugar if: You have diabetes and take medicine for it. You drink a lot of alcohol . You get sick. What are the signs or symptoms? Mild Hunger or feeling like you may vomit. Sweating and feeling cold to the touch. Feeling dizzy or light-headed. Being sleepy or having trouble sleeping. A headache. Blurry vision. Mood changes. These include feeling worried, nervous, or easily annoyed. Moderate Feeling confused. Changes in the way you act. Weakness. An uneven heartbeat. Very bad Having very low blood sugar is an emergency. It can cause: Fainting. Seizures. A coma. Death. How is this diagnosed?  Low blood sugar can be found with a blood test. This test tells you how much sugar is in your blood. It's done while you're having symptoms. Your health care provider may also do an exam and look at your medical history. How is this treated? Treating low blood sugar If you have low blood sugar, eat or drink something with sugar in it right away. The food or drink should have 15 grams of a fast-acting carbohydrate (carb). Options include: 4 oz (120 mL) of  fruit juice. 4 oz (120 mL) of soda (not diet soda). A few pieces of hard candy. Check food labels to see how many pieces to eat. 1 Tbsp (15 mL) of sugar or honey. 4 glucose tablets. 1 tube of glucose gel. Treating low blood sugar if you have diabetes Talk with your provider about how much carb you should take. If you're alert and can swallow safely, you may follow the 15:15 rule: Take 15 grams of a fast-acting carb. Check your blood sugar 15 minutes after you take the carb. If your blood sugar is still at or below 70 mg/dL (3.9 mmol/L), take 15 grams of a carb again. If your blood sugar doesn't go above 70 mg/dL (3.9 mmol/L) after 3 tries, get help right away. After your blood sugar goes back to normal, eat a meal or a snack within 1 hour. Always keep 15 grams of a fast-acting carb with you. This could be: 4 glucose tablets. A few pieces of hard candy. 1 Tbsp (15 mL) of honey or sugar. 1 tube of glucose gel. Treating very low blood sugar If your blood sugar is less than 54 mg/dL (3 mmol/L), it's an emergency. Get help right away. If you can't eat or drink, you will need to be given glucagon. A family member or friend should learn how to check your blood sugar and give you glucagon. Ask your provider if you should keep a glucagon kit at home. You may also need to be treated in a hospital. Follow  these instructions at home: If you have diabetes: Always keep a fast-acting carb (15 grams) with you. Follow your diabetes care plan. Make sure you: Know the symptoms of low blood sugar. Check your blood sugar as often as told. Always check it before and after you exercise. Always check your blood sugar before you drive. Take your medicines as told. Eat on time. Do not skip meals. Share your diabetes care plan with: Your work or school. The people you live with. Wear an alert bracelet or carry a card that says you have diabetes. General instructions If you drink alcohol : Limit how much you  have to: 0-1 drink a day if you're female. 0-2 drinks a day if you're female. Know how much alcohol  is in your drink. In the [place] one drink is one 12 oz bottle of beer (355 mL), one 5 oz glass of wine (148 mL), or one 1 oz glass of hard liquor (44 mL). Be sure to eat food when you drink alcohol . Be sure to check your blood sugar after you drink. Alcohol  may lead to low blood sugar later. Where to find more information American Diabetes Association (ADA): diabetes.org Contact a health care provider if: You have low blood sugar often. You have diabetes and are having trouble keeping your blood sugar in the right range. Get help right away if: You can't get your blood sugar above 70 mg/dL (3.9 mmol/L) after 3 tries. Your blood sugar is below 54 mg/dL (3 mmol/L). You have a seizure. You faint. These symptoms may be an emergency. Call 911 right away. Do not wait to see if the symptoms will go away. Do not drive yourself to the hospital. This information is not intended to replace advice given to you by your health care provider. Make sure you discuss any questions you have with your health care provider. Document Revised: 05/12/2023 Document Reviewed: 10/27/2022 Elsevier Patient Education  2024 [employer].

## 2024-06-28 NOTE — Telephone Encounter (Signed)
 Referral has been faxed to number provided.  Reyes CANDIE Alexander, MD [facility] Internal Medicine @ [facility] [address] [place] [address] [place], [place] [address] [phone]

## 2024-06-29 ENCOUNTER — Encounter: Payer: Self-pay | Admitting: Family Medicine

## 2024-06-29 DIAGNOSIS — E119 Type 2 diabetes mellitus without complications: Secondary | ICD-10-CM

## 2024-07-01 ENCOUNTER — Encounter: Payer: Self-pay | Admitting: Family Medicine

## 2024-07-01 NOTE — Progress Notes (Signed)
 Subjective:    Patient ID: Ashley Jackson, female    DOB: 1961-07-02, 63 y.o.   MRN: [id number]  No chief complaint on file.   HPI Discussed the use of AI scribe software for clinical note transcription with the patient, who gave verbal consent to proceed.  History of Present Illness Ashley Jackson is a 63 year old female with diabetes who presents with concerns about blood sugar management and anxiety.  Recently, her blood sugar was 121 mg/dL in the morning, which she considered 'wonderful'. She monitors her blood sugar three to four times a day, with the highest reading being 170 mg/dL after eating.  She consumes protein every three to four hours, including eggs, cheese sticks, nuts, yogurt, and peanut butter, which helps with her symptoms. She is not currently taking metformin  500 mg twice a day due to fear of exacerbating her symptoms. She is awaiting an endocrinologist appointment scheduled for December 12.  She experiences significant anxiety related to her blood sugar episodes, describing her anxiety as 'through the roof'. She takes 2.5 mg of Xanax  and is considering increasing the dose to 3 mg. She is also on Zoloft  125 mg, which she started after a virtual visit, and is contemplating increasing the dose to 150 mg.  She is concerned about running out of test strips for her glucometer, as she tests her blood sugar three to four times daily and her current supply will not refill until November 15. She mentions a billing issue with [employer] regarding a $6 charge for faxing, which she is unsure how to address.    Past Medical History:  Diagnosis Date   Allergic state 01/24/2017   Anxiety state, unspecified 07/12/2007   COLONIC POLYPS, HX OF 05/02/2007   Dental abscess 05/24/2024   DEPRESSION 07/12/2007   DIABETES MELLITUS, TYPE II, UNCONTROLLED 02/25/2010   EXOGENOUS OBESITY 11/07/2007   Hematuria 06/03/2017   HYPERGLYCEMIA 07/12/2007   HYPERLIPIDEMIA 02/07/2008   Low back pain  04/29/2013   Overweight(278.02) 06/24/2010   Palpitations 01/20/2016   Preventative health care 04/29/2013   Tobacco abuse 10/24/2012   TOBACCO USER 06/24/2010    Past Surgical History:  Procedure Laterality Date   COLONOSCOPY  2006    Family History  Problem Relation Age of Onset   Liver cancer Mother    Hypertension Other    Hypertension Father    Stroke Brother    Seizures Son    Diabetes Maternal Uncle    Breast cancer Neg Hx    Prostate cancer Neg Hx    Heart disease Neg Hx     Social History   Socioeconomic History   Marital status: Divorced    Spouse name: Not on file   Number of children: Not on file   Years of education: Not on file   Highest education level: Some college, no degree  Occupational History   Not on file  Tobacco Use   Smoking status: Every Day    Current packs/day: 1.00    Types: Cigarettes   Smokeless tobacco: Never  Vaping Use   Vaping status: Never Used  Substance and Sexual Activity   Alcohol use: Not Currently   Drug use: No   Sexual activity: Not Currently  Other Topics Concern   Not on file  Social History Narrative   Not on file   Social Drivers of Health   Financial Resource Strain: Patient Declined (06/21/2024)   Overall Financial Resource Strain (CARDIA)    Difficulty of Paying  Living Expenses: Patient declined  Food Insecurity: Patient Declined (06/21/2024)   Hunger Vital Sign    Worried About Running Out of Food in the Last Year: Patient declined    Ran Out of Food in the Last Year: Patient declined  Transportation Needs: Patient Declined (06/21/2024)   PRAPARE - Administrator, Civil Service (Medical): Patient declined    Lack of Transportation (Non-Medical): Patient declined  Physical Activity: Insufficiently Active (11/09/2023)   Exercise Vital Sign    Days of Exercise per Week: 2 days    Minutes of Exercise per Session: 20 min  Stress: Stress Concern Present (11/09/2023)   [employer] of  Occupational Health - Occupational Stress Questionnaire    Feeling of Stress : To some extent  Social Connections: Unknown (06/21/2024)   Social Connection and Isolation Panel    Frequency of Communication with Friends and Family: Not on file    Frequency of Social Gatherings with Friends and Family: Not on file    Attends Religious Services: Not on file    Active Member of Clubs or Organizations: No    Attends Banker Meetings: Not on file    Marital Status: Divorced  Intimate Partner Violence: Unknown (11/26/2021)   Received from [facility]   HITS    Physically Hurt: Not on file    Insult or Talk Down To: Not on file    Threaten Physical Harm: Not on file    Scream or Curse: Not on file    Outpatient Medications Prior to Visit  Medication Sig Dispense Refill   Accu-Chek Softclix Lancets lancets Check sugars once daily.  Dx code: E11.9 100 each 1   ALPRAZolam  (XANAX ) 0.5 MG tablet TAKE 1 TABLET (0.5 MG TOTAL) BY MOUTH 4 (FOUR) TIMES DAILY AS NEEDED FOR ANXIETY. 120 tablet 3   atorvastatin  (LIPITOR) 10 MG tablet Take 1 tablet (10 mg total) by mouth at bedtime. 90 tablet 0   Blood Glucose Calibration (ACCU-CHEK GUIDE CONTROL) LIQD USE AS DIRECTED 1 each 0   Blood Glucose Monitoring Suppl (ACCU-CHEK GUIDE) w/Device KIT Check sugars once daily.  Dx code: E11.9 1 kit 0   sertraline  (ZOLOFT ) 100 MG tablet Take 1 tablet (100 mg total) by mouth daily. (Take with 25mg  to equal 125mg ) 90 tablet 1   sertraline  (ZOLOFT ) 25 MG tablet Take 1 tablet (25 mg total) by mouth daily. (Take with 100mg  to equal 125mg ) 90 tablet 1   glucose blood (ACCU-CHEK GUIDE TEST) test strip Check sugars once daily.  Dx code: E11.9 100 each 1   metFORMIN  (GLUCOPHAGE ) 500 MG tablet Take 1 tablet (500 mg total) by mouth 2 (two) times daily with a meal. 180 tablet 0   No facility-administered medications prior to visit.    Allergies  Allergen Reactions   Atarax [Hydroxyzine] Shortness Of Breath     Anxiety, shortness of breath, tachycardia    Cortisone     HEART RACING   Levofloxacin     anxiety   Sulfa Antibiotics     Panic attacks   Sulfonamide Derivatives     REACTION: Panic attacks   Levofloxacin Anxiety    Review of Systems  Constitutional:  Negative for fever and malaise/fatigue.  HENT:  Negative for congestion.   Eyes:  Negative for blurred vision.  Respiratory:  Negative for shortness of breath.   Cardiovascular:  Negative for chest pain, palpitations and leg swelling.  Gastrointestinal:  Positive for nausea. Negative for abdominal pain and blood in  stool.  Genitourinary:  Negative for dysuria and frequency.  Musculoskeletal:  Negative for falls.  Skin:  Negative for rash.  Neurological:  Positive for dizziness. Negative for loss of consciousness and headaches.  Endo/Heme/Allergies:  Negative for environmental allergies.  Psychiatric/Behavioral:  Negative for depression. The patient is nervous/anxious.        Objective:    Physical Exam Constitutional:      General: She is not in acute distress.    Appearance: Normal appearance. She is well-developed. She is not toxic-appearing.  HENT:     Head: Normocephalic and atraumatic.     Right Ear: External ear normal.     Left Ear: External ear normal.     Nose: Nose normal.  Eyes:     General:        Right eye: No discharge.        Left eye: No discharge.     Conjunctiva/sclera: Conjunctivae normal.  Neck:     Thyroid : No thyromegaly.  Cardiovascular:     Rate and Rhythm: Normal rate and regular rhythm.     Heart sounds: Normal heart sounds. No murmur heard. Pulmonary:     Effort: Pulmonary effort is normal. No respiratory distress.     Breath sounds: Normal breath sounds.  Abdominal:     General: Bowel sounds are normal.     Palpations: Abdomen is soft.     Tenderness: There is no abdominal tenderness. There is no guarding.  Musculoskeletal:        General: Normal range of motion.     Cervical back:  Neck supple.  Lymphadenopathy:     Cervical: No cervical adenopathy.  Skin:    General: Skin is warm and dry.  Neurological:     Mental Status: She is alert and oriented to person, place, and time.  Psychiatric:        Behavior: Behavior normal.        Thought Content: Thought content normal.        Judgment: Judgment normal.     Comments: Pressured speech     BP 128/84   Pulse 80   Temp 97.9 F (36.6 C)   Resp 16   Ht 5' 2 (1.575 m)   Wt 168 lb 6.4 oz (76.4 kg)   LMP 12/09/2015   SpO2 99%   BMI 30.80 kg/m  Wt Readings from Last 3 Encounters:  06/28/24 168 lb 6.4 oz (76.4 kg)  05/24/24 170 lb 12.8 oz (77.5 kg)  01/06/24 170 lb 6.4 oz (77.3 kg)    Diabetic Foot Exam - Simple   No data filed    Lab Results  Component Value Date   WBC 7.3 05/21/2024   HGB 14.9 05/21/2024   HCT 44.6 05/21/2024   PLT 257.0 05/21/2024   GLUCOSE 261 (H) 05/21/2024   CHOL 233 (H) 05/21/2024   TRIG 218.0 (H) 05/21/2024   HDL 65.10 05/21/2024   LDLDIRECT 126.0 03/25/2022   LDLCALC 125 (H) 05/21/2024   ALT 19 05/21/2024   AST 19 05/21/2024   NA 138 05/21/2024   K 4.5 05/21/2024   CL 101 05/21/2024   CREATININE 0.90 05/21/2024   BUN 11 05/21/2024   CO2 29 05/21/2024   TSH 1.51 05/21/2024   HGBA1C 10.2 (H) 05/21/2024   MICROALBUR 1.4 05/21/2024    Lab Results  Component Value Date   TSH 1.51 05/21/2024   Lab Results  Component Value Date   WBC 7.3 05/21/2024   HGB 14.9 05/21/2024  HCT 44.6 05/21/2024   MCV 92.0 05/21/2024   PLT 257.0 05/21/2024   Lab Results  Component Value Date   NA 138 05/21/2024   K 4.5 05/21/2024   CO2 29 05/21/2024   GLUCOSE 261 (H) 05/21/2024   BUN 11 05/21/2024   CREATININE 0.90 05/21/2024   BILITOT 0.5 05/21/2024   ALKPHOS 56 05/21/2024   AST 19 05/21/2024   ALT 19 05/21/2024   PROT 7.0 05/21/2024   ALBUMIN 4.3 05/21/2024   CALCIUM  9.5 05/21/2024   GFR 68.27 05/21/2024   Lab Results  Component Value Date   CHOL 233 (H)  05/21/2024   Lab Results  Component Value Date   HDL 65.10 05/21/2024   Lab Results  Component Value Date   LDLCALC 125 (H) 05/21/2024   Lab Results  Component Value Date   TRIG 218.0 (H) 05/21/2024   Lab Results  Component Value Date   CHOLHDL 4 05/21/2024   Lab Results  Component Value Date   HGBA1C 10.2 (H) 05/21/2024       Assessment & Plan:  Depression with anxiety Assessment & Plan: Patient struggling with high levels of anxiety and suffering from unpredictable panic attacks. She is taking her Sertraline  regularly and it does help some but she can have several days a week when she is unable to function. She continues to struggle   Hyperlipidemia, mixed Assessment & Plan: Encourage heart healthy diet such as MIND or DASH diet, increase exercise, avoid trans fats, simple carbohydrates and processed foods, consider a krill or fish or flaxseed oil cap daily.  Patient continues to decline medications.    Obesity (BMI 30.0-34.9) Assessment & Plan: Encouraged DASH or MIND diet, decrease po intake and increase exercise as tolerated. Needs 7-8 hours of sleep nightly. Avoid trans fats, eat small, frequent meals every 4-5 hours with lean proteins, complex carbs and healthy fats. Minimize simple carbs, high fat foods and processed foods    TOBACCO USER Assessment & Plan: Encouraged complete cessation. Discussed need to quit as relates to risk of numerous cancers, cardiac and pulmonary disease as well as neurologic complications. Counseled for greater than 3 minutes. Is up past a PPD some days   Diabetes mellitus without complication (HCC) -     Accu-Chek Guide Test; Check sugars three times daily.  Dx code: E11.9  Dispense: 100 each; Refill: 5    Assessment and Plan Assessment & Plan Type 2 diabetes mellitus with hypoglycemic symptoms Experiencing hypoglycemic symptoms, particularly at 5 AM and 10 AM, likely due to rapid blood sugar drops. Blood sugar levels have  improved with dietary changes, with morning readings around 121 mg/dL and postprandial readings up to 170 mg/dL. Not taking metformin  due to hypoglycemic symptoms. Improvement noted with dietary adjustments, but symptoms persist. A1c test not recommended at this time due to short interval since last test and potential insurance issues. - Continue dietary adjustments with protein intake every 3-4 hours. - Consider protein-based snacks at midnight and upon waking to prevent morning hypoglycemia. - Discontinued metformin  due to hypoglycemic symptoms. - Will reassess A1c in one month. - Sent new prescription for test strips to check blood sugar TID and as needed.  Anxiety disorder Anxiety exacerbated by hypoglycemic episodes. Currently taking 2.5 mg of Xanax , considering increasing to 3 mg. Discussed potential increase in sertraline  from 125 mg to 150 mg, noting it is a medium dose and may take 6-12 weeks to see full effects. Advised to titrate back down once hypoglycemic episodes are controlled. -  Increase Xanax  to 3 mg daily, with an additional dose available for panic attacks. - Consider increasing sertraline  to 150 mg if anxiety persists after a few weeks. - Monitor anxiety symptoms and adjust medication as needed.  Recording duration: 16 minutes     Harlene Horton, MD

## 2024-07-02 MED ORDER — ACCU-CHEK GUIDE TEST VI STRP: ORAL_STRIP | 5 refills | Status: AC

## 2024-07-02 NOTE — Addendum Note (Signed)
 Addended by: Julane Crock D on: 07/02/2024 01:53 PM   Modules accepted: Orders

## 2024-07-03 NOTE — Telephone Encounter (Signed)
 Copied from CRM [id number]. Topic: Referral - Status >> Jul 03, 2024  9:45 AM Alfonso HERO wrote: Reason for CRM: Powell from [facility] endocrinology called because they got a referral for the patient but they are out of network with the patients insurance and she will need to be referred somewhere else.

## 2024-07-24 NOTE — Telephone Encounter (Signed)
 Called patient and advised that Dr. Domenica could only check off the boxes that was provided on her form for FMLA. She stated that the letter that was faxed could not be used due to she want to go back an half a day to work and she was advised that Dr. Domenica will not be back in office until Thursday,07/26/24.

## 2024-07-27 ENCOUNTER — Telehealth: Payer: Self-pay

## 2024-07-27 NOTE — Telephone Encounter (Signed)
 Encounter open for work note and pt is aware that note is available via MyChart.

## 2024-08-07 ENCOUNTER — Other Ambulatory Visit: Payer: Self-pay | Admitting: *Deleted

## 2024-08-07 ENCOUNTER — Other Ambulatory Visit: Payer: Self-pay

## 2024-08-07 ENCOUNTER — Telehealth: Payer: Self-pay

## 2024-08-07 DIAGNOSIS — E119 Type 2 diabetes mellitus without complications: Secondary | ICD-10-CM

## 2024-08-07 NOTE — Telephone Encounter (Signed)
 Returned pts call and she was advised that it maybe possibly an out of pocket cost for the A1C recheck. Pt agreed and was rescheduled on 08/09/24 @ 9:00 AM.

## 2024-08-07 NOTE — Telephone Encounter (Signed)
 Copied from CRM #[id number]. Topic: Clinical - Request for Lab/Test Order >> Aug 07, 2024 10:12 AM Ashley Jackson wrote: Reason for CRM: Patient states she needs an A1c lab ordered at the request of her endocrinologist, please [phone]

## 2024-08-09 ENCOUNTER — Ambulatory Visit: Payer: Self-pay | Admitting: Family Medicine

## 2024-08-09 ENCOUNTER — Other Ambulatory Visit

## 2024-08-09 DIAGNOSIS — E119 Type 2 diabetes mellitus without complications: Secondary | ICD-10-CM | POA: Diagnosis not present

## 2024-08-09 LAB — HEMOGLOBIN A1C: Hgb A1c MFr Bld: 8.2 % — ABNORMAL HIGH (ref 4.6–6.5)

## 2024-08-18 ENCOUNTER — Other Ambulatory Visit: Payer: Self-pay | Admitting: Family

## 2024-08-18 DIAGNOSIS — Z79899 Other long term (current) drug therapy: Secondary | ICD-10-CM

## 2024-08-20 ENCOUNTER — Other Ambulatory Visit: Payer: Self-pay | Admitting: Family

## 2024-08-20 DIAGNOSIS — Z79899 Other long term (current) drug therapy: Secondary | ICD-10-CM

## 2024-08-20 MED ORDER — ALPRAZOLAM 0.5 MG PO TABS: 0.5000 mg | ORAL_TABLET | Freq: Four times a day (QID) | ORAL | 3 refills | Status: AC | PRN

## 2024-08-29 ENCOUNTER — Ambulatory Visit: Payer: Self-pay

## 2024-08-29 NOTE — Telephone Encounter (Signed)
 FYI Only or Action Required?: Action required by provider: request for appointment.  Patient was last seen in primary care on 06/28/2024 by Ashley Harlene LABOR, MD.  Called Nurse Triage reporting foot pain.  Symptoms began several weeks ago.  Interventions attempted: Rest, hydration, or home remedies.  Symptoms are: gradually worsening Bilateral foot pain. No swelling, no injury. Burning pain, mainly at night..  Triage Disposition: See PCP When Office is Open (Within 3 Days)  Patient/caregiver understands and will follow disposition?: Yes      Copied from CRM #[id number]. Topic: Appointments - Appointment Scheduling >> Aug 29, 2024  9:00 AM [name] wrote: Patient/patient representative is calling to schedule an appointment. Refer to attachments for appointment information.  Patient feet are in pain and feel like they are on fire Reason for Disposition  [1] MODERATE pain (e.g., interferes with normal activities, limping) AND [2] present > 3 days  Answer Assessment - Initial Assessment Questions 1. ONSET: When did the pain start?      2 months, but getting worse. 2. LOCATION: Where is the pain located?      Both feet 3. PAIN: How bad is the pain?    (Scale 1-10; or mild, moderate, severe)     Worse at night 4. WORK OR EXERCISE: Has there been any recent work or exercise that involved this part of the body?      no 5. CAUSE: What do you think is causing the foot pain?     unsure 6. OTHER SYMPTOMS: Do you have any other symptoms? (e.g., leg pain, rash, fever, numbness)     no 7. PREGNANCY: Is there any chance you are pregnant? When was your last menstrual period?     no  Protocols used: Foot Pain-A-AH

## 2024-08-29 NOTE — Telephone Encounter (Signed)
 Hold for [employer] and Domenica return.

## 2024-08-29 NOTE — Telephone Encounter (Signed)
 FYI Only or Action Required?: FYI only for provider: appointment scheduled on 08/31/24 with Dr. Frann.  Patient was last seen in primary care on 06/28/2024 by Domenica Harlene LABOR, MD.  Called Nurse Triage reporting Follow-up.   Triage Disposition: Duplicate Contact Calls  Patient/caregiver understands and will follow disposition?: Yes    Copied from CRM [id number]. Topic: Clinical - Red Word Triage >> Aug 29, 2024  1:13 PM Harlene ORN wrote: Red Word that prompted transfer to Nurse Triage: Patient is calling back about getting scheduled with her PCP and was expecting a call back by the end of today by her PCP. Unfortunately, her PCP and her PCP's nurse are not in the office today, and none of the Providers are not in today either, only nurses, and she refuses to schedule with any nurse.     Reason for Disposition  Caller has already spoken with another triager or doctor (or NP/PA, pharmacist) AND has further questions AND triager able to answer questions.  Answer Assessment - Initial Assessment Questions PT called in to f/u on appt request from this morning. Per chart, pt was told that clinic would f/u with her to schedule sooner appt with PCP. Pt voiced frustration as she states she is currently on STD since September 2025 and her work is denying her claim d/t no visits with Dr. Domenica since November. PT states she has been trying to get in touch with provider and CMA via my chart. Pt states she was getting responses from CMA but nothing since the new year. Pt reports she was originally wrote off work by Dr. Domenica until 08/03/24 with a set return date from work of 09/01/23. Pt states that her case worker has tried to contact clinic for documentation but faxes have not been received; per chart case work is [employer]. Pt voiced frustration as she has been agreeable to POC, labs have been drawn and pt agreeable to behavioral health and endocrine referral. Pt states her last labs were 08/09/24 but no f/u  was scheduled. Pt willing to see alternative provider but not a PA or NP. Discussed alternative doctors and pt agreeable to see Dr. Frann. Discussed he could evaluate pt and documents could be sent but that FMLA and STD forms would be at provider discretion. She voiced understanding. Appointment scheduled for evaluation. Patient agrees with plan of care, and will call back if anything changes, or if symptoms worsen.  Protocols used: No Contact or Duplicate Contact Call-A-AH

## 2024-08-30 NOTE — Telephone Encounter (Signed)
 Copied from CRM [id number]. Topic: General - Other >> Aug 30, 2024  8:58 AM Revonda D wrote: Reason for CRM: Pt is returning a missed call from [facility] and was advised to callback. I informed the pt that Cherise is currently not available but will return her call once available.

## 2024-08-30 NOTE — Telephone Encounter (Signed)
 Returned pt's call and no answer left message to return call.

## 2024-08-30 NOTE — Telephone Encounter (Signed)
 R/s her to 11:30 please. Based on my review of her chart, this will require more than 15 min.

## 2024-08-31 ENCOUNTER — Encounter: Payer: Self-pay | Admitting: Family Medicine

## 2024-08-31 ENCOUNTER — Ambulatory Visit: Admitting: Family Medicine

## 2024-08-31 ENCOUNTER — Other Ambulatory Visit: Payer: Self-pay | Admitting: Family Medicine

## 2024-08-31 VITALS — BP 132/84 | HR 90 | Temp 98.0°F | Resp 16 | Ht 62.0 in | Wt 162.4 lb

## 2024-08-31 DIAGNOSIS — E1142 Type 2 diabetes mellitus with diabetic polyneuropathy: Secondary | ICD-10-CM | POA: Diagnosis not present

## 2024-08-31 DIAGNOSIS — Z2821 Immunization not carried out because of patient refusal: Secondary | ICD-10-CM | POA: Diagnosis not present

## 2024-08-31 DIAGNOSIS — F418 Other specified anxiety disorders: Secondary | ICD-10-CM

## 2024-08-31 NOTE — Patient Instructions (Signed)
 Heat (pad or rice pillow in microwave) over affected area, 10-15 minutes twice daily.   Ice/cold pack over area for 10-15 min twice daily.  Consider topical capsaicin cream to alleviate pain.  Let us  know if you need anything.

## 2024-08-31 NOTE — Telephone Encounter (Signed)
 Pt here today, 08/31/24 @ 9:00 AM.

## 2024-08-31 NOTE — Progress Notes (Signed)
 Musculoskeletal Exam  Patient: Ashley Jackson DOB: [birth year]  DOS: 08/31/2024  SUBJECTIVE:  Chief Complaint:   Chief Complaint  Patient presents with   Foot Injury    Foot Injury    Ashley Jackson is a 64 y.o.  female for evaluation and treatment of b/l foot pain.   Onset:  3 months ago. No inj or change in activity.  Location: both feet Character:  burning  Progression of issue:  is unchanged Associated symptoms: only notices when she lies down, not every night.  No bruising, redness, or swelling. Treatment: to date has been: none.   Neurovascular symptoms: no Hx of uncontrolled diabetes, following with endo. Has historically had poorly controlled diabetes.  She refuses medical intervention but is seeing an endocrinologist coming up.  Past Medical History:  Diagnosis Date   Allergic state 01/24/2017   Anxiety state, unspecified 07/12/2007   COLONIC POLYPS, HX OF 05/02/2007   Dental abscess 05/24/2024   DEPRESSION 07/12/2007   DIABETES MELLITUS, TYPE II, UNCONTROLLED 02/25/2010   EXOGENOUS OBESITY 11/07/2007   Hematuria 06/03/2017   HYPERGLYCEMIA 07/12/2007   HYPERLIPIDEMIA 02/07/2008   Low back pain 04/29/2013   Overweight(278.02) 06/24/2010   Palpitations 01/20/2016   Preventative health care 04/29/2013   Tobacco abuse 10/24/2012   TOBACCO USER 06/24/2010    Objective: VITAL SIGNS: BP 132/84 (BP Location: Left Arm, Patient Position: Sitting)   Pulse 90   Temp 98 F (36.7 C) (Oral)   Resp 16   Ht 5' 2 (1.575 m)   Wt 162 lb 6.4 oz (73.7 kg)   LMP 12/09/2015   SpO2 96%   BMI 29.70 kg/m  Constitutional: Well formed, well developed. No acute distress. Skin: Some callus formation and dry skin but otherwise no lesions on the feet Cardiac: DP pulses 3+ bilaterally Thorax & Lungs: No accessory muscle use Musculoskeletal: Feet.   Tenderness to palpation: no Deformity: no Ecchymosis: no Neurologic: Normal sensory function to pinprick. No focal deficits noted.  DTR's equal and symmetric in LE's. No clonus. Psychiatric: Normal mood. Age appropriate judgment and insight. Alert & oriented x 3.    Assessment:  Diabetic polyneuropathy associated with type 2 diabetes mellitus (HCC)  Refused pneumococcal vaccination  Plan: Chronic, not controlled.  Gave several options including gabapentin, changing Zoloft  to duloxetine, and capsaicin cream.  She will consider the cream.  Recommended optimizing her sugars even more.  She does not wish to have any medical intervention from that standpoint. Recommended she have this vaccination which she politely declined. F/u with regular PCP as originally scheduled. The patient voiced understanding and agreement to the plan.  I spent 30 minutes with the patient discussing the above plans in addition to reviewing her chart on the the same day of the visit.   Mabel [place], DO 08/31/2024  12:35 PM

## 2024-08-31 NOTE — Telephone Encounter (Signed)
 Spoke with pt via MyChart due to her not answering when calling.

## 2024-09-04 ENCOUNTER — Telehealth: Payer: Self-pay

## 2024-09-04 NOTE — Telephone Encounter (Signed)
 Copied from CRM #[id number]. Topic: General - Other >> Sep 04, 2024  9:10 AM Aleatha BROCKS wrote: Reason for CRM: Please give patient a call if labs are needed or not prior to her April app for 4 month f/u call back # [phone]

## 2024-09-06 NOTE — Telephone Encounter (Signed)
 Patient was advised.

## 2024-09-24 ENCOUNTER — Ambulatory Visit: Admitting: Family Medicine

## 2024-10-01 ENCOUNTER — Ambulatory Visit: Admitting: Family Medicine

## 2024-10-18 ENCOUNTER — Ambulatory Visit: Admitting: Family Medicine

## 2024-12-10 ENCOUNTER — Ambulatory Visit: Admitting: Family Medicine
